# Patient Record
Sex: Female | Born: 1960 | Race: White | Hispanic: No | Marital: Single | Smoking: Current every day smoker
Health system: Southern US, Community
[De-identification: ages and names within clinical notes are randomized; demographics above are authoritative.]

## PROBLEM LIST (undated history)

## (undated) DIAGNOSIS — M1990 Unspecified osteoarthritis, unspecified site: Secondary | ICD-10-CM

## (undated) DIAGNOSIS — F32A Depression, unspecified: Secondary | ICD-10-CM

## (undated) DIAGNOSIS — J189 Pneumonia, unspecified organism: Secondary | ICD-10-CM

## (undated) DIAGNOSIS — F419 Anxiety disorder, unspecified: Secondary | ICD-10-CM

## (undated) DIAGNOSIS — R569 Unspecified convulsions: Secondary | ICD-10-CM

## (undated) DIAGNOSIS — IMO0001 Reserved for inherently not codable concepts without codable children: Secondary | ICD-10-CM

## (undated) DIAGNOSIS — M797 Fibromyalgia: Secondary | ICD-10-CM

## (undated) DIAGNOSIS — G709 Myoneural disorder, unspecified: Secondary | ICD-10-CM

## (undated) DIAGNOSIS — F329 Major depressive disorder, single episode, unspecified: Secondary | ICD-10-CM

## (undated) DIAGNOSIS — I10 Essential (primary) hypertension: Secondary | ICD-10-CM

## (undated) DIAGNOSIS — M8580 Other specified disorders of bone density and structure, unspecified site: Secondary | ICD-10-CM

## (undated) DIAGNOSIS — M549 Dorsalgia, unspecified: Secondary | ICD-10-CM

## (undated) DIAGNOSIS — G4730 Sleep apnea, unspecified: Secondary | ICD-10-CM

## (undated) DIAGNOSIS — S62102A Fracture of unspecified carpal bone, left wrist, initial encounter for closed fracture: Secondary | ICD-10-CM

## (undated) DIAGNOSIS — J449 Chronic obstructive pulmonary disease, unspecified: Secondary | ICD-10-CM

## (undated) DIAGNOSIS — K219 Gastro-esophageal reflux disease without esophagitis: Secondary | ICD-10-CM

## (undated) HISTORY — PX: TONSILLECTOMY: SUR1361

## (undated) HISTORY — PX: DILATION AND CURETTAGE OF UTERUS: SHX78

## (undated) HISTORY — DX: Other specified disorders of bone density and structure, unspecified site: M85.80

## (undated) HISTORY — PX: JOINT REPLACEMENT: SHX530

## (undated) HISTORY — PX: TOTAL SHOULDER REPLACEMENT: SUR1217

## (undated) HISTORY — DX: Chronic obstructive pulmonary disease, unspecified: J44.9

## (undated) HISTORY — PX: KNEE SURGERY: SHX244

## (undated) HISTORY — PX: ANKLE SURGERY: SHX546

## (undated) HISTORY — PX: SHOULDER SURGERY: SHX246

## (undated) HISTORY — PX: FOOT SURGERY: SHX648

---

## 1999-12-01 ENCOUNTER — Other Ambulatory Visit: Admission: RE | Admit: 1999-12-01 | Discharge: 1999-12-01 | Payer: Self-pay | Admitting: Family Medicine

## 2004-08-18 ENCOUNTER — Encounter
Admission: RE | Admit: 2004-08-18 | Discharge: 2004-10-20 | Payer: Self-pay | Admitting: Physical Medicine and Rehabilitation

## 2004-09-16 ENCOUNTER — Ambulatory Visit: Payer: Self-pay | Admitting: Physical Medicine and Rehabilitation

## 2004-09-29 ENCOUNTER — Ambulatory Visit: Payer: Self-pay | Admitting: Family Medicine

## 2004-10-20 ENCOUNTER — Encounter
Admission: RE | Admit: 2004-10-20 | Discharge: 2005-01-18 | Payer: Self-pay | Admitting: Physical Medicine and Rehabilitation

## 2004-10-30 ENCOUNTER — Ambulatory Visit: Payer: Self-pay | Admitting: Family Medicine

## 2004-11-19 ENCOUNTER — Encounter
Admission: RE | Admit: 2004-11-19 | Discharge: 2005-02-17 | Payer: Self-pay | Admitting: Physical Medicine and Rehabilitation

## 2004-11-24 ENCOUNTER — Ambulatory Visit: Payer: Self-pay | Admitting: Family Medicine

## 2004-12-03 ENCOUNTER — Ambulatory Visit: Payer: Self-pay | Admitting: Family Medicine

## 2005-01-12 ENCOUNTER — Encounter: Admission: RE | Admit: 2005-01-12 | Discharge: 2005-01-12 | Payer: Self-pay | Admitting: Orthopedic Surgery

## 2005-01-15 ENCOUNTER — Ambulatory Visit (HOSPITAL_BASED_OUTPATIENT_CLINIC_OR_DEPARTMENT_OTHER): Admission: RE | Admit: 2005-01-15 | Discharge: 2005-01-15 | Payer: Self-pay | Admitting: Orthopedic Surgery

## 2005-01-15 ENCOUNTER — Ambulatory Visit (HOSPITAL_COMMUNITY): Admission: RE | Admit: 2005-01-15 | Discharge: 2005-01-15 | Payer: Self-pay | Admitting: Orthopedic Surgery

## 2005-05-07 ENCOUNTER — Inpatient Hospital Stay (HOSPITAL_COMMUNITY): Admission: RE | Admit: 2005-05-07 | Discharge: 2005-05-08 | Payer: Self-pay | Admitting: Orthopedic Surgery

## 2005-05-07 ENCOUNTER — Ambulatory Visit (HOSPITAL_COMMUNITY): Admission: RE | Admit: 2005-05-07 | Discharge: 2005-05-07 | Payer: Self-pay | Admitting: Orthopedic Surgery

## 2005-06-01 ENCOUNTER — Encounter: Admission: RE | Admit: 2005-06-01 | Discharge: 2005-08-30 | Payer: Self-pay | Admitting: Orthopedic Surgery

## 2005-07-08 ENCOUNTER — Ambulatory Visit: Payer: Self-pay | Admitting: Family Medicine

## 2006-04-04 ENCOUNTER — Encounter: Admission: RE | Admit: 2006-04-04 | Discharge: 2006-05-17 | Payer: Self-pay | Admitting: Orthopedic Surgery

## 2006-06-03 ENCOUNTER — Ambulatory Visit (HOSPITAL_BASED_OUTPATIENT_CLINIC_OR_DEPARTMENT_OTHER): Admission: RE | Admit: 2006-06-03 | Discharge: 2006-06-03 | Payer: Self-pay | Admitting: Orthopedic Surgery

## 2006-11-02 ENCOUNTER — Ambulatory Visit (HOSPITAL_BASED_OUTPATIENT_CLINIC_OR_DEPARTMENT_OTHER): Admission: RE | Admit: 2006-11-02 | Discharge: 2006-11-02 | Payer: Self-pay | Admitting: Orthopedic Surgery

## 2006-12-12 ENCOUNTER — Ambulatory Visit: Payer: Self-pay | Admitting: Family Medicine

## 2007-01-24 ENCOUNTER — Ambulatory Visit: Payer: Self-pay | Admitting: Family Medicine

## 2007-02-10 ENCOUNTER — Ambulatory Visit: Payer: Self-pay | Admitting: Family Medicine

## 2007-02-24 ENCOUNTER — Ambulatory Visit: Payer: Self-pay | Admitting: Internal Medicine

## 2007-03-22 ENCOUNTER — Ambulatory Visit (HOSPITAL_COMMUNITY): Admission: RE | Admit: 2007-03-22 | Discharge: 2007-03-22 | Payer: Self-pay | Admitting: Orthopedic Surgery

## 2007-05-05 ENCOUNTER — Encounter: Admission: RE | Admit: 2007-05-05 | Discharge: 2007-06-06 | Payer: Self-pay | Admitting: Orthopedic Surgery

## 2007-05-09 ENCOUNTER — Ambulatory Visit: Payer: Self-pay | Admitting: Internal Medicine

## 2008-04-16 ENCOUNTER — Encounter: Payer: Self-pay | Admitting: Internal Medicine

## 2009-12-04 ENCOUNTER — Telehealth (INDEPENDENT_AMBULATORY_CARE_PROVIDER_SITE_OTHER): Payer: Self-pay | Admitting: *Deleted

## 2010-10-18 HISTORY — PX: BREAST SURGERY: SHX581

## 2010-11-17 NOTE — Progress Notes (Signed)
Summary: Records request from [facility]  Request for records received from [facility]. Request forwarded to [facility]. Wilder Glade  December 04, 2009 12:28 PM

## 2011-03-02 NOTE — Assessment & Plan Note (Signed)
HEALTHCARE                             PULMONARY OFFICE NOTE   PRESTYN, MAHN                          MRN:          [id number]  DATE:05/09/2007                            DOB:          03/05/1961    HISTORY:  A 50 year old white female, active smoker, seen on Feb 24, 2007  with evidence of at least moderate COPD with question of asthmatic  component started on Symbicort 160/4.5 two puffs b.i.d. with the  recommendation that she commit to quit smoking.  She has not been able  to really cut down, is still on 1 pack per day but states she is overall  doing much better.  For some reason she did not understand my  instructions to only take DuoNeb on a p.r.n. basis and continue to use  it at least 3 times daily, including immediately on getting up in the  morning before she even tries to take her Symbicort.  She denies any  excess sputum production, fevers, chills, sweats, pleuritic pain,  orthopnea, PND, or leg swelling.  She states that she is so much better  on Symbicort that after she uses her inhalers she is no longer limited  in terms of activities of daily living, albeit relatively sedentary.   MEDICATIONS:  Reviewed in detail on the worksheet and contain multiple  psychotropics, see column dated May 09, 2007 for details.   PHYSICAL EXAMINATION:  She is a thin, ambulatory white female in no  acute distress.  She is afebrile, normal vital signs.  HEENT:  Unremarkable, oropharynx clear.  LUNGS:  Fields reveal diminished breath sounds bilaterally, no wheezing.  Hoover sign is positive early on inspiration.  Regular rhythm without murmur, gallop, rub.  EXTREMITIES:  Warm without calf tenderness, cyanosis, clubbing, edema.   PFTs performed today after she had used her morning Symbicort and  indicates an FEV1 of 64% predicted with a ratio of 49%, no further  improvement after bronchodilators.  Diffusion capacity 58% and a FRC of  160%.   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  At 64% predicted she has enough of a  ventilatory reserve to be able to enjoy activities of daily living for  now.  At this point I think it makes sense to use Symbicort, however as  a maintenance, and not to use DuoNeb unless she is having a bad day.   Rather than add additional medications at this point I emphasized the  importance of her choice to stop smoking.  If she is able to quit  smoking, perhaps it would be possible to taper the Symbicort because the  asthmatic component to the problem likely would resolve off of  cigarettes.  If she worsens I would not hesitate to add Spiriva but I  told her that unless she still feels limited after she has used the  bronchodilators there is no point to add it and that stopping smoking  would make more sense than adding additional inhalers at this  point.   I spent an extra 15-20 minutes of a 25 minute visit going over the  natural history of COPD in the active smoker and referred her back to  Dr. Lysbeth Galas for longterm followup.  We will be happy to see her back here  in [place] on a p.r.n. basis.     Charlaine Dalton. Sherene Sires, MD, [facility]  Electronically Signed    MBW/MedQ  DD: 05/09/2007  DT: 05/10/2007  Job #: [id number]   cc:   Delaney Meigs, M.D.

## 2011-03-02 NOTE — Assessment & Plan Note (Signed)
Brenda Duran                             PULMONARY OFFICE Duran   Brenda Duran, Brenda Duran                          MRN:          [id number]  DATE:02/24/2007                            DOB:          24-Jan-1961    CHIEF COMPLAINT:  Dyspnea and cough.   HISTORY:  This is a 50 year old white female, who states she has been  having trouble with dyspnea and cough since she was 17, which  corresponds roughly to the time she started smoking.  She continues to  smoke a pack a day with paroxysms of dyspnea and cough that wax and  wane, typically worse in the spring, but definitely worse for the last  month and a half (which roughly correlates to the spring season).  She  coughs up minimal mucus, but the most productive cough is first thing in  the morning and is somewhat dark.  The rest of the mucus during the day  is somewhat clear.  She is short of breath on her bad days, just  walking from one room to the next.  On her good days, she is relatively  sedentary, but is able to carry on normal activities.  She says she is  some better after using a nebulizer, but it does not last as long as  it used to, and she is having to use it more frequently.  She also  occasionally wakes up at night with coughing and dyspnea.  She denies  any pleuritic pain, classic orthopnea, PND, or leg-swelling.   PAST MEDICAL HISTORY:  Significant for hypertension and asthma and  multiple orthopedic surgeries.   ALLERGIES:  None known.   MEDICATIONS INCLUDE:  Lorazepam, Norvasc, Neurontin and Lamictal, as  well as a nebulizer that I believe is DuoNeb (although she was not  sure).  She says she is using various inhalers in the past that do not  work.   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   FAMILY HISTORY:  Significant in that her mother had emphysema and was a  smoker.  Father had lung cancer and was a smoker.   REVIEW OF SYSTEMS:  Taken in detail on the work sheet.  Negative, except  as outlined above.   PHYSICAL EXAMINATION:  This is an anxious white female, who had a very  difficult time answering any questions I asked her in a straight-forward  fashion.  She is afebrile, normal vital signs.  HEENT:  She was edentulous with a partial plate in place.  Oropharynx  was otherwise unremarkable.  Nasal turbinates were normal.  Ear canals  were clear.  NECK:  The neck was supple, no cervical adenopathy or tenderness.  Trachea is midline, no thyromegaly.  CHEST:  Minimally barrel-shaped and hyper-resonant to percussion with  diminished breath sounds, with minimal true wheeze.  This exam was four  and a half hours after her last DuoNeb.  There was regular rate and  rhythm without murmur, gallop or rub.  ABDOMEN:  Soft,  benign with Hoover sign at the end of inspiration.  EXTREMITIES:  Warm, without calf tenderness, cyanosis, clubbing or  edema.   Hemoglobin saturation 94% on room air.   She brought a disc with her, but it would not load on the computer and  was done at [facility].  I have requested her x-ray report.  She  says this was from a chest x-ray done yesterday at [facility].   IMPRESSION:  COPD, secondary to active smoking, with a probable  asthmatic component that would be better addressed by smoking cessation  than by piling on medications or consultants.  I am sure she has heard  the same message from Dr. Lysbeth Galas, but she heard it loud and clear from  me in as many different ways as I could express it to her.   For now, I recommended the following approach:   1. We will check a chest x-ray report when available from [facility].  2. Add Symbicort 160/4.5 two puffs b.i.d.  (Although she has used      inhalers, she did not recognize this one.  I think it is worth      trying to see if there is an asthmatic component that can be      reversed.)  3. For dyspnea, use DuoNeb every six  hours p.r.n., not around-the-      clock.  4. For cough, use Mucinex-DM one to two q.12.   I sense this patient either has extremely poor insight into her problem  or significant psychologic issues that are preventing her from really  processing the information she is given.  I made a copy of my  recommendations, including a reasonable acid reflux diet (since so many  patients with difficult-to-control airway symptoms have reflux).  The  first statement on the reflux diet is, Stop tobacco in all forms,  which I highlighted for her benefit.   Followup will be in six weeks with a set of PFTs.     Brenda Dalton. Sherene Sires, MD, [facility]  Electronically Signed    MBW/MedQ  DD: 02/24/2007  DT: 02/24/2007  Job #: [id number]   cc:   Delaney Meigs, M.D.
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
## 2011-03-05 NOTE — Op Note (Signed)
NAME:  Brenda Duran, Brenda Duran                 ACCOUNT NO.:  [id number]   MEDICAL RECORD NO.:  [id number]          PATIENT TYPE:  AMB   LOCATION:  [facility]                          FACILITY:  [facility]   PHYSICIAN:  Harvie Junior, M.D.   DATE OF BIRTH:  1961/04/28   DATE OF PROCEDURE:  01/15/2005  DATE OF DISCHARGE:                                 OPERATIVE REPORT   PREOPERATIVE DIAGNOSIS:  Lateral meniscal tear.   POSTOPERATIVE DIAGNOSES:  1.  Lateral meniscal tear.  2.  Chondromalacia of the patella.  3.  Medial shelf plica.   PRINCIPAL PROCEDURES:  1.  Partial posterior horn lateral meniscectomy, as well as including the      midbody and anterior.  2.  Debridement of chondromalacia of the patella, in particular the      posterior surface of the patella.  3.  Debridement of medial shelf plica.   SURGEON:  Harvie Junior, M.D.   ASSISTANT:  Marshia Ly, P.A.   ANESTHESIA:  General.   BRIEF HISTORY:  A 50 year old female with a long history of having right  knee pain.  She ultimately underwent an MRI which showed she had a complex  lateral meniscal tear as well as some questionable tibial plateau  chondromalacia.  We treated her conservatively for a period of time, and  because of failure of conservative care she ultimately was taken to the  operating room for operative knee arthroscopy.   PROCEDURE:  The patient was brought to the operating room.  After adequate  anesthesia was obtained  with a general anesthetic, the patient was placed  on the operating table.  The right leg was then prepped and draped in the  usual sterile fashion.  Following this, routine arthroscopic examination of  the knee revealed that there was an obvious posterior horn as well as  midbody as well as anterior lateral meniscal tear, and there was a portion  of that meniscus folded up into the notch.  This was debrided with a  combination of straight biting forceps and backbiting forceps, and following  this, the  remaining menisci __________ shaver probe was used to make sure it  was stable.  Attention was then turned to the notch, where the ACL was  normal.  Attention was turned medially, where the medial meniscus was  normal.  Medial femoral condyle and tibial plateau normal.  A large medial  shelf plica blocking entrance into the medial compartment was debrided to  stop rubbing on the medial femoral condyle.  At this point, attention was  turned to the posterior aspect of the patella, which had a large chondral  defect which was  debrided, and at this point once this was completed, the wound was copiously  irrigated and suctioned dry.  A sterile compressive dressing was applied,  and the patient was taken to the recovery room and noted to be in  satisfactory condition.  Estimated blood loss for the procedure was minimal.      JLG/MEDQ  D:  01/15/2005  T:  01/15/2005  Job:  [id number]   cc:  Harvie Junior, M.D.  [address]  [place]  [place] [address]  Fax: [phone]

## 2011-03-05 NOTE — Op Note (Signed)
NAME:  LENISE, JR                 ACCOUNT NO.:  [id number]   MEDICAL RECORD NO.:  [id number]          PATIENT TYPE:  AMB   LOCATION:  [facility]                          FACILITY:  [facility]   PHYSICIAN:  Harvie Junior, M.D.   DATE OF BIRTH:  May 16, 1961   DATE OF PROCEDURE:  06/03/2006  DATE OF DISCHARGE:                                 OPERATIVE REPORT   SURGEON:  Harvie Junior, M.D.   ASSISTANT:  Marshia Ly, P.A.   PREOPERATIVE DIAGNOSIS:  Osteochondral defect, medial talus, left.   POSTOPERATIVE DIAGNOSES:  1. Osteochondral defect, medial talus, left.  2. Lateral gutter impingement.   PRINCIPLE PROCEDURES:  1. Debridement and drilling of osteochondral defect, left ankle.  2. Debridement of lateral gutter impingement.   ANESTHESIA:  General.   BRIEF HISTORY:  The patient is a 50 year old female with a long history of  having had significant bilateral ankle pain.  X-rays show that she had an  osteochondral defect.  We talked about treatment options and ultimately felt  after failure of care that arthroscopic debridement would be the most  appropriate course of action.  She is brought to the operating room for this  procedure.   DESCRIPTION OF PROCEDURE:  The patient was brought to the operating room,  and after adequate anesthesia was obtained with a general anesthetic, the  patient was placed on the operating table and the left ankle was then  prepped and draped in the usual sterile fashion.  Following this, routine  arthroscopic examination of the ankle revealed there was an obvious lateral-  gutter impingement.  This was debrided with a suction shaver back to a  smooth and stable rim.  The lateral gutter was checked and noted to be  stable.  Attention was then turned over to the medial compartment, where the  buckle in the cartilage was identified.  A probe was used, and there was  seen to be no area that was satisfactory for compression underneath the  cartilage.  So,  anterior-to-posterior, we felt that the most appropriate  course of action was going to be debridement of this area. There was no bone  on the back side.  I did not feel that we could get any kind of ingrowth to  support this cartilage.  At this point, the area was debrided with the  suction shaver, and once this was accomplished, the area was drilled with  vascular awl enhancement drilling.  Once that was accomplished, the ankle  was copiously irrigated and suctioned dry.  The arthroscopic portals  were closed with an interrupted nylon stitching.  A sterile compressive  dressing was applied, as well as a Cam walker, and the patient was taken to  recovery, where she was noted to be in satisfactory condition.  Estimated  blood loss for the procedure was negligible.      Harvie Junior, M.D.  Electronically Signed     JLG/MEDQ  D:  06/03/2006  T:  06/03/2006  Job:  [id number]

## 2011-03-05 NOTE — Assessment & Plan Note (Signed)
Brenda Duran is a 50 year old white female who has returned to pain and  rehabilitative clinic for a recheck.  She was last seen on September 16, 2004.  She had another appointment August 20, 2004.  She was too late to be  seen that day.  She cancelled another clinic appointment on October 22, 2004.   She has complaints of pain in bilateral feet, and her right shoulder, as  well as her right knee.  She has recently undergone some left foot surgery  and has some well healed scars in the anterior surface of the left foot.  Apparently, there are some plans for surgery on the right foot as well.  Her  pain is about a 6 on a scale of 10.  She describes it as stinging.  It  interferes with her activity and enjoyment of life quite a bit.  Her pain is  worse for her in the morning and at night.  Sleep is overall poor.  The pain  is worse with walking, bending, sitting, standing, and certain activities.  It improves with rest and medication.   Relief from her medications is overall fair.  She has been using Lidoderm.  She has been on Lexapro and hydrocodone.  She has been getting her hydrocodone from Dr. Lysbeth Galas.   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      DMK/MedQ  D:  11/11/2004 17:35:59  T:  11/11/2004 22:39:34  Job #:  [id number]

## 2011-03-05 NOTE — Group Therapy Note (Signed)
ALLERGIES:  HYDROCODONE CAUSES ITCHING.   HISTORY:  Brenda Duran is a single white female who presents to our clinic for  management of right knee pain. She is a patient of Fidel Levy.   She recently has undergone left foot surgery by Dr. Adam Phenix,  approximately 1 week ago.  She had 3 hammer toes which were surgically  repaired by him.   She reports that her knee has had some swelling in it off and on since the  spring. She had an incident where she was dancing and squatted and developed  pain. She has had fluid removed from the knee and has had injections to the  knee. She reports the injections helped for a couple of days but no long  lasting relief.   She denies any other trauma to the knee.   She describes her knee pain as about a 7 on a scale of 10. Has had some  intermittent swelling, worse when she is up walking on it, especially since  her surgery a week ago. She has had to put a lot more stress on that right  leg.   She denies any numbness, tingling or weakness into the lower extremities.  Does report some numbness and tingling into bilateral upper extremities,  especially when she is doing hand work.   The patient has some limitations in her functional status, especially with  walking at this point.  She uses a quad cane. She has a cast boot that she  has to put on her left foot. She does help out somewhat with the cleaning  around the house, some lighter house tasks such as meal preparation, washing  the dishes. Spends a good amount of time watching TV and playing Nintendo.   She has a history of a DWI and no longer drives.   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   She denies harm to self or others.  Overall affect and mood are fairly good.   She has a history of alcohol use, drinks about a 6-pack on a weekend. Admits  to marijuana use. Smokes a pack of cigarettes a day.  Lives with her  boyfriend, has a 32-year-old son who has some developmental problems  secondary to hydrocephalus.   FAMILY HISTORY:  Remarkable for a sister and mother, possibly a paternal  grandmother who all have problems with high arches and hammer toes.   ALLERGIES:  CODEINE (causes itching).   REVIEW OF SYSTEMS:  Negative for diabetes, ulcers, cancers, kidney problems,  thyroid problems, heart problems or high blood pressure.  Does admit to some  problems with shortness of breath, swelling, wheezing and coughing, poor  appetite, reflux, poor sleep, weakness, numbness, spasms, seizures.   PAST SURGICAL HISTORY:  Remarkable for previous bilateral foot surgery in  addition to this recent left foot surgery.  History of tonsillectomy and  some gynecologic surgery as well.   CURRENT MEDICATIONS:  1.  Hydrocodone 5/500 1 p.o. q.4-6h.  2.  Diazepam 5 mg 1 p.o. t.i.d.  3.  Promethazine 25 mg p.r.n.   PHYSICAL EXAMINATION:  GENERAL:  A mildly obese white female, does not  appear in distress. She is alert and oriented, cooperative.  VITAL SIGNS:  Blood pressure is 131/111, pulse 99, respirations 18, 95%  saturation on room air.  MUSCULOSKELETAL EXAM:  She is able to get out of the chair, her gait in the  room  is obviously antalgic with decreased weight bearing on the involved  left foot.  Seated, she is noted to have mild effusion in the right knee.  No joint line tenderness although she does have limitations and full  extension, she lacks about 10 to 15 degrees, she is able to flex to about  110 degrees.  She has some lateral medial laxity but firm end points with  the collateral ligaments.   No swelling in the right lower extremity. Sensation is intact. Pulses are  intact. She is has rather underdeveloped calves bilaterally.  Left lower  extremity is remarkable for some swelling noted about the ankle. The rest of  her foot is in a bandage, her great toe is exposed, the rest of her toes are   bandaged.   Again, pulses at the posterior tibia are noted and the left lower extremity  intact. Motor strength in the lower extremities as well as upper extremities  are in the 5/5 range. Hip flexors, knee extensors, and dorsiflexors, plantar  flexors, EHL was not tested on the left.  Sensation was intact with light touch and cold in the lower extremities.   Reflexes are 2+ in the upper extremities, and 2+ in the lower extremities at  the knees and ankles. No clonus is noted.   IMPRESSION:  1.  Right knee effusion with knee pain.  2.  Left foot status post hammer toe repair one week postoperatively.  3.  History of some intermittent hand tingling.  4.  History of marijuana use.   PLAN:  Will give the patient some samples of Lidoderm to put over her right  knee and give her a prescription.  May have her set up to see an  orthopedist. She may need an MRI or further evaluation to determine etiology  of the constant/intermittent effusion she is having as well as her lack of  full range of motion. I am not sure if she has a meniscal injury or not  here.  It sounds as though she has had fluid removed several times with  recurrence of the effusion.  On next visit will assess her further after she  is a little further down the road from her operation. If she does have  bilateral foot pain, may consider adding Neurontin. Would also consider  possibly some EMG nerve conduction studies, it appears she may have a family  history of a hereditary neuropathy here.  May consider wrist splints for her  intermittent numbness and tingling she has in her hands. Will consider  referral to a neurologist as well.  Will see her back in 1 month and also  obtain the urine drug screen.      DMK/MedQ  D:  09/16/2004 16:36:02  T:  09/16/2004 19:05:29  Job #:  [id number]

## 2011-03-05 NOTE — Op Note (Signed)
NAME:  Brenda Duran, Brenda Duran                 ACCOUNT NO.:  [id number]   MEDICAL RECORD NO.:  [id number]          PATIENT TYPE:  INP   LOCATION:  5009                         FACILITY:  [facility]   PHYSICIAN:  Harvie Junior, M.D.   DATE OF BIRTH:  01/15/1961   DATE OF PROCEDURE:  05/07/2005  DATE OF DISCHARGE:                                 OPERATIVE REPORT   PREOPERATIVE DIAGNOSIS:  Impingement acromioclavicular joint arthritis with  known rotator cuff tear and questionable biceps tendon issues.   POSTOPERATIVE DIAGNOSIS:  1.  Rotator cuff tear, full thickness.  2.  Biceps tendon tear, full thickness.  3.  Impingement.  4.  Acromioclavicular joint arthritis.  5.  Posterior superior labral tear.   PROCEDURE:  1.  Mini-open rotator cuff repair with acromioplasty.  2.  Distal clavicle resection arthroscopic.  3.  Biceps tenodesis, open.  4.  Debridement of posterior superior labral tear.   SURGEON:  Harvie Junior, M.D.   ASSISTANT:  Marshia Ly, P.A.-C.   ANESTHESIA:  General.   BRIEF HISTORY:  The patient is a 50 year old female with a long history of  having had significant right shoulder pain.  MRI was obtained which showed  rotator cuff tear, biceps tendon issues, and questionable labral tear.  We  talked about treatment options and ultimately felt that given her young age,  that fixation was the most appropriate course of action.  She is brought to  the operating room for this procedure.   DESCRIPTION OF PROCEDURE:  The patient was taken to the operating room and  after adequate anesthesia was obtained with general anesthesia, the patient  was placed supine on the operating table.  She was then moved to the beach  chair position, all bony prominences were well padded.  Attention was turned  to the right shoulder where routine arthroscopic visualization revealed that  there was an obvious and significant posterior superior labral tear, the  biceps tendon was obviously  dislocated, and not within its groove.  It was  thought at that point that the biceps tendon may be salvageable.  As we  probed the biceps tendon further, it appeared that there was shredding of  the biceps tendon and there was significant fray of the biceps tendon.  At  that point, it was felt that the most appropriate course of action was going  to be bicipital tenodesis and the biceps tendon was cut away from the  superior labrum and the superior labrum associated with the posterior  extension tear was debrided.  The glenohumeral joint showed no significant  evidence of arthritis.  The rotator cuff was evaluated from the under  surface and noted to have significant tear, as well.  At this point, the  attention was turned away from the glenohumeral joint into the subacromial  space.  An anterolateral acromioplasty was performed from the lateral  compartment, a distal clavicle resection over 15 mm was performed through an  anterior compartment.  Once these two procedures had been performed,  attention was turned towards the rotator cuff.  The tear was identified  from  the superior surface.  At this point, the arthroscopic portion of the case  was abandoned and attention was turned to a mini-open procedure.  A small  incision was made over the lateral aspect of the shoulder.  The subcutaneous  tissues were dissected down to the level of the raphe between the anterior  and middle deltoid.  This was divided and the rotator cuff tear was  identified below.  Time was spent identifying the biceps tendon and the  rotator cuff tear biceps tendon tenodesis was performed by putting a whip  stitch through the length of the biceps tendon and, following this, with  tenodesing the biceps tendon with a biceps tenodesis screw.  A 7 mm hole was  drilled in the biceps tendon area and the tendon was tenodesed with the  tenodesis screw.  The rotator cuff tear was then identified and two 5 mm  suture anchors  each with two sutures were used and the rotator cuff was  repaired back to bone.  A side-to-side repair was done and a rent between  the supraspinatus and infraspinatus.  At this point, the shoulder was put  through a range of motion, no tendency towards impingement.  The wound was  copiously irrigated and suctioned dry.  The raphe between the middle and  anterior deltoid was closed with #1 Vicryl running suture, the subcutaneous  tissue with 0 and 2-0 Vicryl, and the skin with 3-0 Prolene subcuticular  suture.  A sterile compressive dressing was applied and the patient was  taken to the recovery room where she was noted to be in satisfactory  condition.  Estimated blood loss for the procedure was none.       JLG/MEDQ  D:  05/07/2005  T:  05/07/2005  Job:  [id number]

## 2011-03-05 NOTE — Op Note (Signed)
NAME:  Brenda Duran, Brenda Duran                 ACCOUNT NO.:  [id number]   MEDICAL RECORD NO.:  [id number]          PATIENT TYPE:  AMB   LOCATION:  DSC                          FACILITY:  [facility]   PHYSICIAN:  Harvie Junior, M.D.   DATE OF BIRTH:  Jul 28, 1961   DATE OF PROCEDURE:  11/02/2006  DATE OF DISCHARGE:                               OPERATIVE REPORT   POSTOPERATIVE DIAGNOSIS:  Osteochondritis desiccans, medial ankle.   POSTOPERATIVE DIAGNOSIS:  Osteochondritis desiccans, medial ankle.   PRINCIPLE PROCEDURE:  Ankle arthroscopy with debridement of full-  thickness osteochondral defect on the medial side, with vascular  enhancement drilling.   SURGEON:  Harvie Junior, M.D.   ASSISTANT:  Marshia Ly, P.A.   ANESTHESIA:  General.   BRIEF HISTORY:  Brenda Duran is a 50 year old female with a long history of  having had significant pain in her bilateral ankles.  Ultimately, she  had x-ray showing osteochondral defects bilaterally.  We had debrided  and drilled the left ankle, and she had done wonderfully with this, and  because of continued complaints of pain in the right ankle, she was  ultimately taken to the operating room for this procedure.   PROCEDURE:  The patient was taken to the operating room, and after  adequate anesthesia was obtained with a general anesthetic, the patient  was placed supine on the operating room table.  The right leg was  prepped and draped in the usual sterile fashion.  Following this,  routine arthroscopic examination of the right ankle revealed that there  was an obvious OCD on the medial side.  A probe was used and you could  see the large flaps of cartilage.  We debrided the flaps of cartilage  out, cleaned up the flaps of cartilage, and then we used the vascular  enhancement awls to make 6 holes in this area.  We could see blood  escaping from the portals once this was done.  Attention was then turned  to the lateral gutter, where there was some lateral  impingement, which  was debrided.  The central portion had no significant abnormality, and  we were able to see all the way over the top to the back of the tibia.  At this point, the ankle was copiously irrigated with normal sterile  saline irrigation and suctioned dry, and the arthroscopic portals were  closed with a bandage after a single stitch was placed at each partial.  A sterile compressive dressing was applied and a posterior splint.  The  patient was taken to recovery, where she was noted to be in satisfactory  condition.  Estimated blood loss for the procedure was negligible.      Harvie Junior, M.D.  Electronically Signed     JLG/MEDQ  D:  11/02/2006  T:  11/02/2006  Job:  [id number]

## 2011-10-19 DIAGNOSIS — G4730 Sleep apnea, unspecified: Secondary | ICD-10-CM

## 2011-10-19 HISTORY — DX: Sleep apnea, unspecified: G47.30

## 2012-02-09 ENCOUNTER — Emergency Department (HOSPITAL_COMMUNITY)
Admission: EM | Admit: 2012-02-09 | Discharge: 2012-02-09 | Disposition: A | Discharge status: Home / Self Care | Payer: Medicaid Other | Attending: Emergency Medicine | Admitting: Emergency Medicine

## 2012-02-09 ENCOUNTER — Encounter (HOSPITAL_COMMUNITY): Payer: Self-pay | Admitting: Emergency Medicine

## 2012-02-09 ENCOUNTER — Emergency Department (HOSPITAL_COMMUNITY): Payer: Medicaid Other

## 2012-02-09 DIAGNOSIS — F329 Major depressive disorder, single episode, unspecified: Secondary | ICD-10-CM | POA: Insufficient documentation

## 2012-02-09 DIAGNOSIS — M545 Low back pain, unspecified: Secondary | ICD-10-CM | POA: Insufficient documentation

## 2012-02-09 DIAGNOSIS — F17200 Nicotine dependence, unspecified, uncomplicated: Secondary | ICD-10-CM | POA: Insufficient documentation

## 2012-02-09 DIAGNOSIS — F3289 Other specified depressive episodes: Secondary | ICD-10-CM | POA: Insufficient documentation

## 2012-02-09 DIAGNOSIS — M549 Dorsalgia, unspecified: Secondary | ICD-10-CM

## 2012-02-09 DIAGNOSIS — I10 Essential (primary) hypertension: Secondary | ICD-10-CM | POA: Insufficient documentation

## 2012-02-09 HISTORY — DX: Essential (primary) hypertension: I10

## 2012-02-09 HISTORY — DX: Dorsalgia, unspecified: M54.9

## 2012-02-09 HISTORY — DX: Major depressive disorder, single episode, unspecified: F32.9

## 2012-02-09 HISTORY — DX: Depression, unspecified: F32.A

## 2012-02-09 MED ORDER — KETOROLAC TROMETHAMINE 60 MG/2ML IM SOLN
60.0000 mg | Freq: Once | INTRAMUSCULAR | Status: AC
  Administered 2012-02-09: 60 mg via INTRAMUSCULAR
  Filled 2012-02-09: qty 2

## 2012-02-09 MED ORDER — OXYCODONE-ACETAMINOPHEN 5-325 MG PO TABS: 1.0000 | ORAL_TABLET | ORAL | Status: AC | PRN

## 2012-02-09 MED ORDER — HYDROMORPHONE HCL PF 1 MG/ML IJ SOLN
1.0000 mg | Freq: Once | INTRAMUSCULAR | Status: AC
  Administered 2012-02-09: 1 mg via INTRAMUSCULAR
  Filled 2012-02-09: qty 1

## 2012-02-09 MED ORDER — CYCLOBENZAPRINE HCL 10 MG PO TABS: 10.0000 mg | ORAL_TABLET | Freq: Two times a day (BID) | ORAL | Status: AC | PRN

## 2012-02-09 NOTE — ED Notes (Signed)
Pt c/o back pain after her back popped today. Pt denies any injury.

## 2012-02-09 NOTE — Discharge Instructions (Signed)
Your xray did not show any changes to your back. Make position changes slowly. Apply heat for comfort. Use the pain medicine and muscle relaxant as directed. Follow up with your doctor.   Back Exercises Back exercises help treat and prevent back injuries. The goal is to increase your strength in your belly (abdominal) and back muscles. These exercises can also help with flexibility. Start these exercises when told by your doctor. HOME CARE Back exercises include: Pelvic Tilt.  Lie on your back with your knees bent. Tilt your pelvis until the lower part of your back is against the floor. Hold this position 5 to 10 sec. Repeat this exercise 5 to 10 times.  Knee to Chest.  Pull 1 knee up against your chest and hold for 20 to 30 seconds. Repeat this with the other knee. This may be done with the other leg straight or bent, whichever feels better. Then, pull both knees up against your chest.  Sit-Ups or Curl-Ups.  Bend your knees 90 degrees. Start with tilting your pelvis, and do a partial, slow sit-up. Only lift your upper half 30 to 45 degrees off the floor. Take at least 2 to 3 seonds for each sit-up. Do not do sit-ups with your knees out straight. If partial sit-ups are difficult, simply do the above but with only tightening your belly (abdominal) muscles and holding it as told.  Hip-Lift.  Lie on your back with your knees flexed 90 degrees. Push down with your feet and shoulders as you raise your hips 2 inches off the floor. Hold for 10 seconds, repeat 5 to 10 times.  Back Arches.  Lie on your stomach. Prop yourself up on bent elbows. Slowly press on your hands, causing an arch in your low back. Repeat 3 to 5 times.  Shoulder-Lifts.  Lie face down with arms beside your body. Keep hips and belly pressed to floor as you slowly lift your head and shoulders off the floor.  Do not overdo your exercises. Be careful in the beginning. Exercises may cause you some mild back discomfort. If the pain  lasts for more than 15 minutes, stop the exercises until you see your doctor. Improvement with exercise for back problems is slow.  Document Released: 11/06/2010 Document Revised: 09/23/2011 Document Reviewed: 11/06/2010 [facility] Patient Information 2012 [place], [place].

## 2012-02-09 NOTE — ED Provider Notes (Signed)
History     CSN: [id number]  Arrival date & time 02/09/12  1309   First MD Initiated Contact with Patient 02/09/12 1332      Chief Complaint  Patient presents with  . Back Pain    (Consider location/radiation/quality/duration/timing/severity/associated sxs/prior treatment) HPI Brenda Duran is a 51 y.o. female brought in by ambulance, who presents to the Emergency Department complaining of low back pain. She reports she was standing in her home when all of a sudden she felt her low back pop and she was unable to walk without assistance. She had not done anything strenuous and had not made any unusual movements. Pain is located across his entire lower back. She has taken no medicines. Past Medical History  Diagnosis Date  . Back pain   . Depression   . Hypertension     Past Surgical History  Procedure Date  . Foot surgery   . Knee surgery   . Shoulder surgery   . Back surgery   . Ankle surgery     History reviewed. No pertinent family history.  History  Substance Use Topics  . Smoking status: Current Everyday Smoker    Types: Cigarettes  . Smokeless tobacco: Not on file  . Alcohol Use: No    OB History    Grav Para Term Preterm Abortions TAB SAB Ect Mult Living                  Review of Systems  Constitutional: Negative for fever.       10 Systems reviewed and are negative for acute change except as noted in the HPI. Tearful  HENT: Negative for congestion.   Eyes: Negative for discharge and redness.  Respiratory: Negative for cough and shortness of breath.   Cardiovascular: Negative for chest pain.  Gastrointestinal: Negative for vomiting and abdominal pain.  Musculoskeletal: Negative for back pain.       Low back pain  Skin: Negative for rash.  Neurological: Negative for syncope, numbness and headaches.  Psychiatric/Behavioral:       No behavior change.    Allergies  Review of patient's allergies indicates no known allergies.  Home Medications  No  current outpatient prescriptions on file.  BP 149/97  Pulse 85  Temp(Src) 98.1 F (36.7 C) (Oral)  Resp 20  Ht 4\' 11"  (1.499 m)  Wt 141 lb (63.957 kg)  BMI 28.48 kg/m2  SpO2 94%  Physical Exam  Nursing note and vitals reviewed. Constitutional:       Awake, alert, nontoxic appearance with baseline speech.  HENT:  Head: Atraumatic.  Eyes: Pupils are equal, round, and reactive to light. Right eye exhibits no discharge. Left eye exhibits no discharge.  Neck: Neck supple.  Cardiovascular: Normal rate and regular rhythm.   No murmur heard. Pulmonary/Chest: Effort normal and breath sounds normal. No respiratory distress. She has no wheezes. She has no rales. She exhibits no tenderness.  Abdominal: Soft. Bowel sounds are normal. She exhibits no mass. There is no tenderness. There is no rebound.  Musculoskeletal:       Thoracic back: She exhibits no tenderness.       Lumbar back: She exhibits no tenderness.       Bilateral lower extremities non tender without new rashes or color change, baseline ROM with intact DP / PT pulses, CR<2 secs all digits bilaterally, sensation baseline light touch bilaterally for pt, DTR's symmetric and intact bilaterally KJ / AJ, motor symmetric bilateral 5 / 5 hip flexion, quadriceps,  hamstrings, EHL, foot dorsiflexion, foot plantarflexion, gait not tested due to lower back pain.No pain with percussion tot he cervical, thoracic or lumbar spine. Pain with palpation to the paraspinal muscles bilaterally lumbar spine.  Neurological:       Mental status baseline for patient.  Upper extremity motor strength and sensation intact and symmetric bilaterally.  Skin: No rash noted.  Psychiatric: She has a normal mood and affect.    ED Course  Procedures (including critical care time)  Dg Lumbar Spine Complete  02/09/2012  *RADIOLOGY REPORT*  Clinical Data: Low back pain.  LUMBAR SPINE - COMPLETE 4+ VIEW  Comparison: MRI from 03/17/2011  Findings: No evidence for  fracture.  Loss of disc height is seen at L4-5 and L5-S1.  Facets are unremarkable.  SI joints are unremarkable.  IMPRESSION: Lower lumbar disc disease.  Original Report Authenticated By: ERIC A. MANSELL, M.D.     MDM  Patient with low back pain and no injury. Given IM analgesic and anti-inflammatory with some relief. She was able to sit on the side of the bed, and ambulate with assistance. LS spine xrays with out acute findings.Pt stable in ED with no significant deterioration in condition.The patient appears reasonably screened and/or stabilized for discharge and I doubt any other medical condition or other [facility] requiring further screening, evaluation, or treatment in the ED at this time prior to discharge.  MDM Reviewed: nursing note and vitals Interpretation: x-ray           Nicoletta Dress. Colon Branch, MD 02/09/12 [id number]

## 2012-09-08 ENCOUNTER — Emergency Department (HOSPITAL_COMMUNITY)
Admission: EM | Admit: 2012-09-08 | Discharge: 2012-09-08 | Disposition: A | Discharge status: Home / Self Care | Payer: Medicaid Other | Attending: Emergency Medicine | Admitting: Emergency Medicine

## 2012-09-08 ENCOUNTER — Emergency Department (HOSPITAL_COMMUNITY): Payer: Medicaid Other

## 2012-09-08 ENCOUNTER — Encounter (HOSPITAL_COMMUNITY): Payer: Self-pay | Admitting: *Deleted

## 2012-09-08 DIAGNOSIS — Y939 Activity, unspecified: Secondary | ICD-10-CM | POA: Insufficient documentation

## 2012-09-08 DIAGNOSIS — R079 Chest pain, unspecified: Secondary | ICD-10-CM | POA: Insufficient documentation

## 2012-09-08 DIAGNOSIS — F3289 Other specified depressive episodes: Secondary | ICD-10-CM | POA: Insufficient documentation

## 2012-09-08 DIAGNOSIS — J40 Bronchitis, not specified as acute or chronic: Secondary | ICD-10-CM | POA: Insufficient documentation

## 2012-09-08 DIAGNOSIS — S46919A Strain of unspecified muscle, fascia and tendon at shoulder and upper arm level, unspecified arm, initial encounter: Secondary | ICD-10-CM

## 2012-09-08 DIAGNOSIS — F329 Major depressive disorder, single episode, unspecified: Secondary | ICD-10-CM | POA: Insufficient documentation

## 2012-09-08 DIAGNOSIS — I10 Essential (primary) hypertension: Secondary | ICD-10-CM | POA: Insufficient documentation

## 2012-09-08 DIAGNOSIS — Y929 Unspecified place or not applicable: Secondary | ICD-10-CM | POA: Insufficient documentation

## 2012-09-08 DIAGNOSIS — S43499A Other sprain of unspecified shoulder joint, initial encounter: Secondary | ICD-10-CM | POA: Insufficient documentation

## 2012-09-08 DIAGNOSIS — W1789XA Other fall from one level to another, initial encounter: Secondary | ICD-10-CM | POA: Insufficient documentation

## 2012-09-08 DIAGNOSIS — Z79899 Other long term (current) drug therapy: Secondary | ICD-10-CM | POA: Insufficient documentation

## 2012-09-08 DIAGNOSIS — F17200 Nicotine dependence, unspecified, uncomplicated: Secondary | ICD-10-CM | POA: Insufficient documentation

## 2012-09-08 MED ORDER — DEXAMETHASONE 6 MG PO TABS: ORAL_TABLET | ORAL | Status: DC

## 2012-09-08 MED ORDER — DEXAMETHASONE SODIUM PHOSPHATE 4 MG/ML IJ SOLN
8.0000 mg | Freq: Once | INTRAMUSCULAR | Status: AC
  Administered 2012-09-08: 8 mg via INTRAMUSCULAR
  Filled 2012-09-08: qty 1

## 2012-09-08 MED ORDER — DIAZEPAM 5 MG PO TABS
5.0000 mg | ORAL_TABLET | Freq: Once | ORAL | Status: AC
  Administered 2012-09-08: 5 mg via ORAL
  Filled 2012-09-08: qty 1

## 2012-09-08 MED ORDER — HYDROCODONE-ACETAMINOPHEN 5-325 MG PO TABS
2.0000 | ORAL_TABLET | Freq: Once | ORAL | Status: AC
  Administered 2012-09-08: 2 via ORAL
  Filled 2012-09-08: qty 2

## 2012-09-08 MED ORDER — DOXYCYCLINE HYCLATE 100 MG PO TABS
100.0000 mg | ORAL_TABLET | Freq: Once | ORAL | Status: AC
  Administered 2012-09-08: 100 mg via ORAL
  Filled 2012-09-08: qty 1

## 2012-09-08 MED ORDER — HYDROCODONE-ACETAMINOPHEN 5-325 MG PO TABS: ORAL_TABLET | ORAL | Status: DC

## 2012-09-08 MED ORDER — DOXYCYCLINE HYCLATE 100 MG PO CAPS: 100.0000 mg | ORAL_CAPSULE | Freq: Two times a day (BID) | ORAL | Status: AC

## 2012-09-08 MED ORDER — ALBUTEROL SULFATE HFA 108 (90 BASE) MCG/ACT IN AERS
2.0000 | INHALATION_SPRAY | RESPIRATORY_TRACT | Status: DC
  Administered 2012-09-08: 2 via RESPIRATORY_TRACT
  Filled 2012-09-08: qty 6.7

## 2012-09-08 NOTE — Progress Notes (Signed)
Instructed patient on use of Albuterol MDI with spacer. She was able to demonstrate her understanding.

## 2012-09-08 NOTE — ED Notes (Addendum)
Tripped yesterday, now having pain lt side upper back,  Alert,No HI, no loc  Increase pain with deep breath.

## 2012-09-08 NOTE — ED Provider Notes (Signed)
History     CSN: [id number]  Arrival date & time 09/08/12  1458   First MD Initiated Contact with Patient 09/08/12 1526      Chief Complaint  Patient presents with  . Fall    (Consider location/radiation/quality/duration/timing/severity/associated sxs/prior treatment) Patient is a 51 y.o. female presenting with fall. The history is provided by the patient.  Fall The accident occurred yesterday. Distance fallen: fell from standing position. She landed on a hard floor. Point of impact: left back. Pain location: left shoulder and left upper rib area  The pain is moderate. She was ambulatory at the scene. Pertinent negatives include no fever, no numbness, no abdominal pain, no bowel incontinence, no vomiting, no hematuria and no headaches. Exacerbated by: deep breathing. She has tried nothing for the symptoms.    Past Medical History  Diagnosis Date  . Back pain   . Depression   . Hypertension     Past Surgical History  Procedure Date  . Foot surgery   . Knee surgery   . Shoulder surgery   . Ankle surgery     History reviewed. No pertinent family history.  History  Substance Use Topics  . Smoking status: Current Every Day Smoker    Types: Cigarettes  . Smokeless tobacco: Not on file  . Alcohol Use: No    OB History    Grav Para Term Preterm Abortions TAB SAB Ect Mult Living                  Review of Systems  Constitutional: Negative for fever and activity change.       All ROS Neg except as noted in HPI  HENT: Negative for nosebleeds and neck pain.   Eyes: Negative for photophobia and discharge.  Respiratory: Negative for cough, shortness of breath and wheezing.   Cardiovascular: Negative for chest pain and palpitations.  Gastrointestinal: Negative for vomiting, abdominal pain, blood in stool and bowel incontinence.  Genitourinary: Negative for dysuria, frequency and hematuria.  Musculoskeletal: Negative for back pain and arthralgias.  Skin: Negative.     Neurological: Negative for dizziness, seizures, speech difficulty, numbness and headaches.  Psychiatric/Behavioral: Negative for hallucinations and confusion.    Allergies  Review of patient's allergies indicates no known allergies.  Home Medications   Current Outpatient Rx  Name  Route  Sig  Dispense  Refill  . ALBUTEROL SULFATE (2.5 MG/3ML) 0.083% IN NEBU   Nebulization   Take 2.5 mg by nebulization every 6 (six) hours as needed.         Maximino Greenland 18-103 MCG/ACT IN AERO   Inhalation   Inhale 2 puffs into the lungs every 6 (six) hours as needed. wheezing         . AMLODIPINE BESYLATE 5 MG PO TABS   Oral   Take 5 mg by mouth daily.         Marland Kitchen CITALOPRAM HYDROBROMIDE 40 MG PO TABS   Oral   Take 40 mg by mouth daily.         . IPRATROPIUM BROMIDE 0.02 % IN SOLN   Nebulization   Take 500 mcg by nebulization 4 (four) times daily.         Marland Kitchen METHOCARBAMOL 500 MG PO TABS   Oral   Take 500-1,000 mg by mouth 2 (two) times daily.          Marland Kitchen OMEPRAZOLE 20 MG PO CPDR   Oral   Take 20 mg by mouth daily.         Marland Kitchen  TRAMADOL HCL 50 MG PO TABS   Oral   Take 50 mg by mouth every 6 (six) hours as needed. pain         . TRAZODONE HCL 100 MG PO TABS   Oral   Take 200 mg by mouth at bedtime.          Marland Kitchen DICLOFENAC SODIUM 75 MG PO TBEC   Oral   Take 75 mg by mouth 2 (two) times daily.           BP 123/70  Pulse 75  Temp 98.1 F (36.7 C) (Oral)  Resp 20  Ht 4\' 11"  (1.499 m)  Wt 130 lb (58.968 kg)  BMI 26.26 kg/m2  SpO2 95%  Physical Exam  Nursing note and vitals reviewed. Constitutional: She is oriented to person, place, and time. She appears well-developed and well-nourished.  Non-toxic appearance.  HENT:  Head: Normocephalic.  Right Ear: Tympanic membrane and external ear normal.  Left Ear: Tympanic membrane and external ear normal.  Eyes: EOM and lids are normal. Pupils are equal, round, and reactive to light.  Neck: Normal range of  motion. Neck supple. Carotid bruit is not present.  Cardiovascular: Normal rate, regular rhythm, normal heart sounds, intact distal pulses and normal pulses.   Pulmonary/Chest: Breath sounds normal. No respiratory distress.  Abdominal: Soft. Bowel sounds are normal. There is no tenderness. There is no guarding.  Musculoskeletal: Normal range of motion.  Lymphadenopathy:       Head (right side): No submandibular adenopathy present.       Head (left side): No submandibular adenopathy present.    She has no cervical adenopathy.  Neurological: She is alert and oriented to person, place, and time. She has normal strength. No cranial nerve deficit or sensory deficit.  Skin: Skin is warm and dry.  Psychiatric: She has a normal mood and affect. Her speech is normal.    ED Course  Procedures (including critical care time)  Labs Reviewed - No data to display No results found.   No diagnosis found.    MDM  I have reviewed nursing notes, vital signs, and all appropriate lab and imaging results for this patient. Pt tripped yesterday and sustained upper back pain. Chest xray is neg for acute problem. There is emphysema present. Pulse ox 95%. Pt has symptoms of bronchitis.  Ddecadron, doxycycline, and norco given to the patient.       Kathie Dike, [place] 09/09/12 1304

## 2012-09-08 NOTE — ED Notes (Signed)
Pt presents after fall yesterday with c/o left shoulder/scapula pain and upper left rib pain.  No deformity noted at this time. Pt has limited ROM, pt states has "trouble with shoulders from time to time".  Pt also c/o pain with deep breathing. SAO2 96 on room air. BBS with fine rales throughout lung field. Denies SOB and other injuries at this time.

## 2012-09-12 NOTE — ED Provider Notes (Signed)
Medical screening examination/treatment/procedure(s) were performed by non-physician practitioner and as supervising physician I was immediately available for consultation/collaboration.   Shelda Jakes, MD 09/12/12 [phone]

## 2012-10-03 ENCOUNTER — Emergency Department (HOSPITAL_COMMUNITY)
Admission: EM | Admit: 2012-10-03 | Discharge: 2012-10-03 | Discharge status: Against Medical Advice | Payer: Medicaid Other | Attending: Emergency Medicine | Admitting: Emergency Medicine

## 2012-10-03 ENCOUNTER — Encounter (HOSPITAL_COMMUNITY): Payer: Self-pay

## 2012-10-03 DIAGNOSIS — M549 Dorsalgia, unspecified: Secondary | ICD-10-CM | POA: Insufficient documentation

## 2012-10-03 NOTE — ED Notes (Signed)
Complain of back pain in pain in left shoulder

## 2013-08-22 ENCOUNTER — Telehealth: Payer: Self-pay | Admitting: Family Medicine

## 2013-08-22 NOTE — Telephone Encounter (Signed)
Spoke with pt to inform we can establish her as a pt after old balance is pd and provider name on M[facility] switched to [facility] Informed pt that we do not do pain management but we can refer to pain management clinic Pt verbalizes understanding

## 2013-09-19 ENCOUNTER — Encounter: Payer: Self-pay | Admitting: Family Medicine

## 2013-09-19 ENCOUNTER — Ambulatory Visit (INDEPENDENT_AMBULATORY_CARE_PROVIDER_SITE_OTHER): Payer: Medicaid Other | Admitting: Family Medicine

## 2013-09-19 ENCOUNTER — Encounter (INDEPENDENT_AMBULATORY_CARE_PROVIDER_SITE_OTHER): Payer: Self-pay

## 2013-09-19 VITALS — BP 126/80 | HR 84 | Temp 98.0°F | Ht 61.0 in | Wt 143.0 lb

## 2013-09-19 DIAGNOSIS — R7309 Other abnormal glucose: Secondary | ICD-10-CM

## 2013-09-19 DIAGNOSIS — K219 Gastro-esophageal reflux disease without esophagitis: Secondary | ICD-10-CM

## 2013-09-19 DIAGNOSIS — J449 Chronic obstructive pulmonary disease, unspecified: Secondary | ICD-10-CM

## 2013-09-19 DIAGNOSIS — F39 Unspecified mood [affective] disorder: Secondary | ICD-10-CM

## 2013-09-19 DIAGNOSIS — I10 Essential (primary) hypertension: Secondary | ICD-10-CM

## 2013-09-19 LAB — POCT CBC
Granulocyte percent: 64.5 %G (ref 37–80)
HCT, POC: 46.8 % (ref 37.7–47.9)
Hemoglobin: 14.7 g/dL (ref 12.2–16.2)
POC Granulocyte: 5.7 (ref 2–6.9)
RBC: 5 M/uL (ref 4.04–5.48)
RDW, POC: 13.4 %

## 2013-09-19 MED ORDER — ALBUTEROL SULFATE (2.5 MG/3ML) 0.083% IN NEBU: 2.5000 mg | INHALATION_SOLUTION | Freq: Four times a day (QID) | RESPIRATORY_TRACT | Status: DC | PRN

## 2013-09-19 MED ORDER — OMEPRAZOLE 20 MG PO CPDR: 20.0000 mg | DELAYED_RELEASE_CAPSULE | Freq: Every day | ORAL | Status: DC

## 2013-09-19 MED ORDER — IPRATROPIUM BROMIDE 0.02 % IN SOLN: 500.0000 ug | Freq: Four times a day (QID) | RESPIRATORY_TRACT | Status: DC

## 2013-09-19 MED ORDER — IPRATROPIUM-ALBUTEROL 18-103 MCG/ACT IN AERO: 2.0000 | INHALATION_SPRAY | Freq: Four times a day (QID) | RESPIRATORY_TRACT | Status: DC | PRN

## 2013-09-19 MED ORDER — CITALOPRAM HYDROBROMIDE 40 MG PO TABS: 40.0000 mg | ORAL_TABLET | Freq: Every day | ORAL | Status: DC

## 2013-09-19 MED ORDER — AMLODIPINE BESYLATE 10 MG PO TABS: 5.0000 mg | ORAL_TABLET | Freq: Every day | ORAL | Status: DC

## 2013-09-19 NOTE — Progress Notes (Signed)
New Patient History and Physical  Patient name: Brenda Duran Medical record number: [id number] Date of birth: 11/10/1960 Age: 52 y.o. Gender: female  Primary Care Provider: Rudi Heap, MD  Chief Complaint: establish care   History of Present Illness: Patient presents today to establish care. No acute issues today. Baseline history of chronic pain, COPD with active one pack per day smoking, reflux, hypertension. Previously a patient at [facility]. Not checking BP on a regular basis No CP or SOB Reports having mammogram last year.  Otherwise needs colonoscopy as well as flu, tdap, pneumovax.  Is in process of going to pain management for chronic pain, but needs a referral.  Trying to quit smoking, but still with 1PPD use.  Persistent daily wheeze     Past Medical History: There are no active problems to display for this patient.  Past Medical History  Diagnosis Date  . Back pain   . Depression   . Hypertension     Past Surgical History: Past Surgical History  Procedure Laterality Date  . Foot surgery    . Knee surgery    . Shoulder surgery    . Ankle surgery    . Breast surgery Left 2012    lumpectomy    Social History: History   Social History  . Marital Status: Single    Spouse Name: N/A    Number of Children: N/A  . Years of Education: N/A   Social History Main Topics  . Smoking status: Current Every Day Smoker    Types: Cigarettes  . Smokeless tobacco: None  . Alcohol Use: No  . Drug Use: No  . Sexual Activity: None   Other Topics Concern  . None   Social History Narrative  . None    Family History: Family History  Problem Relation Age of Onset  . Cancer Mother   . Cancer Father     Allergies: No Known Allergies  Current Outpatient Prescriptions  Medication Sig Dispense Refill  . albuterol (PROVENTIL) (2.5 MG/3ML) 0.083% nebulizer solution Take 2.5 mg by nebulization every 6 (six) hours as needed.      Marland Kitchen  albuterol-ipratropium (COMBIVENT) 18-103 MCG/ACT inhaler Inhale 2 puffs into the lungs every 6 (six) hours as needed. wheezing      . amLODipine (NORVASC) 5 MG tablet Take 5 mg by mouth daily.      . citalopram (CELEXA) 40 MG tablet Take 40 mg by mouth daily.      . diclofenac (VOLTAREN) 75 MG EC tablet Take 75 mg by mouth 2 (two) times daily.      Marland Kitchen HYDROcodone-acetaminophen (NORCO) 5-325 MG per tablet 1 po q4h prn pain  20 tablet  0  . ipratropium (ATROVENT) 0.02 % nebulizer solution Take 500 mcg by nebulization 4 (four) times daily.      . methocarbamol (ROBAXIN) 500 MG tablet Take 500-1,000 mg by mouth 2 (two) times daily.       Marland Kitchen omeprazole (PRILOSEC) 20 MG capsule Take 20 mg by mouth daily.      . traMADol (ULTRAM) 50 MG tablet Take 50 mg by mouth every 6 (six) hours as needed. pain      . traZODone (DESYREL) 100 MG tablet Take 200 mg by mouth at bedtime.        No current facility-administered medications for this visit.   Review Of Systems: 12 point ROS negative except as noted above in HPI.  Physical Exam: Filed Vitals:   09/19/13 1419  BP:  126/80  Pulse: 84  Temp: 98 F (36.7 C)    General: alert and appears older than stated age HEENT: PERRLA, extra ocular movement intact and trachea midline Heart: S1, S2 normal, no murmur, rub or gallop, regular rate and rhythm Lungs: unlabored breathing and wheezing Abdomen: abdomen is soft without significant tenderness, masses, organomegaly or guarding Extremities: extremities normal, atraumatic, no cyanosis or edema Skin:no rashes, no ecchymoses Neurology: normal without focal findings  Labs and Imaging:  Assessment and Plan: HTN (hypertension) - Plan: POCT CBC, Comprehensive metabolic panel, NMR, lipoprofile, POCT glycosylated hemoglobin (Hb A1C), amLODipine (NORVASC) 10 MG tablet  COPD (chronic obstructive pulmonary disease) - Plan: ipratropium (ATROVENT) 0.02 % nebulizer solution, albuterol-ipratropium (COMBIVENT) 18-103  MCG/ACT inhaler, albuterol (PROVENTIL) (2.5 MG/3ML) 0.083% nebulizer solution  Mood disorder - Plan: citalopram (CELEXA) 40 MG tablet  GERD (gastroesophageal reflux disease) - Plan: omeprazole (PRILOSEC) 20 MG capsule  Meds refilled today apart from chronic pain  Check baseline labs including LDL, A1c Would recommend daily ASA, but pt reports ? Hx/o stomach ulcers in the past.  Immunizations and colonoscopy info.  Otherwise follow up in 3 months pending labs.         Doree Albee MD

## 2013-09-20 LAB — NMR, LIPOPROFILE
Cholesterol: 139 mg/dL (ref <200)
LDL Particle Number: 726 nmol/L (ref <1000)
LDL Size: 20.5 nm — ABNORMAL LOW (ref >20.5)
LDLC SERPL CALC-MCNC: 39 mg/dL (ref <100)
Triglycerides by NMR: 187 mg/dL — ABNORMAL HIGH (ref <150)

## 2013-09-20 LAB — COMPREHENSIVE METABOLIC PANEL
Albumin: 4.4 g/dL (ref 3.5–5.5)
BUN: 20 mg/dL (ref 6–24)
Chloride: 97 mmol/L (ref 97–108)
Creatinine, Ser: 0.52 mg/dL — ABNORMAL LOW (ref 0.57–1.00)
GFR calc Af Amer: 128 mL/min/{1.73_m2} (ref >59)
Globulin, Total: 2.1 g/dL (ref 1.5–4.5)
Glucose: 102 mg/dL — ABNORMAL HIGH (ref 65–99)
Total Bilirubin: <0.2 mg/dL (ref 0.0–1.2)
Total Protein: 6.5 g/dL (ref 6.0–8.5)

## 2013-09-26 ENCOUNTER — Telehealth: Payer: Self-pay | Admitting: Family Medicine

## 2013-09-27 NOTE — Telephone Encounter (Signed)
Vm full- needs labs results

## 2013-10-09 ENCOUNTER — Telehealth: Payer: Self-pay | Admitting: Family Medicine

## 2013-10-09 DIAGNOSIS — M549 Dorsalgia, unspecified: Secondary | ICD-10-CM

## 2013-10-09 NOTE — Telephone Encounter (Signed)
Labs discussed. Patient was anticipating a pain management referral for chronic back pain. Referral placed. Patient aware that it will take a while for pain management referral.

## 2013-10-22 ENCOUNTER — Other Ambulatory Visit: Payer: Medicaid Other | Admitting: General Practice

## 2013-10-23 ENCOUNTER — Encounter: Payer: Self-pay | Admitting: *Deleted

## 2013-10-29 ENCOUNTER — Encounter: Payer: Self-pay | Admitting: General Practice

## 2013-10-29 ENCOUNTER — Ambulatory Visit (INDEPENDENT_AMBULATORY_CARE_PROVIDER_SITE_OTHER): Payer: Medicaid Other | Admitting: General Practice

## 2013-10-29 VITALS — BP 104/65 | HR 82 | Temp 97.7°F | Ht 61.0 in | Wt 140.0 lb

## 2013-10-29 DIAGNOSIS — Z1211 Encounter for screening for malignant neoplasm of colon: Secondary | ICD-10-CM

## 2013-10-29 DIAGNOSIS — Z0000 Encounter for general adult medical examination without abnormal findings: Secondary | ICD-10-CM

## 2013-10-29 DIAGNOSIS — Z01419 Encounter for gynecological examination (general) (routine) without abnormal findings: Secondary | ICD-10-CM

## 2013-10-29 DIAGNOSIS — Z1212 Encounter for screening for malignant neoplasm of rectum: Secondary | ICD-10-CM

## 2013-10-29 DIAGNOSIS — J449 Chronic obstructive pulmonary disease, unspecified: Secondary | ICD-10-CM

## 2013-10-29 MED ORDER — IPRATROPIUM BROMIDE 0.02 % IN SOLN: 500.0000 ug | Freq: Four times a day (QID) | RESPIRATORY_TRACT | Status: DC

## 2013-10-29 MED ORDER — ALBUTEROL SULFATE (2.5 MG/3ML) 0.083% IN NEBU: 2.5000 mg | INHALATION_SOLUTION | Freq: Four times a day (QID) | RESPIRATORY_TRACT | Status: DC | PRN

## 2013-10-30 NOTE — Progress Notes (Signed)
   Subjective:    Patient ID: Brenda Duran, female    DOB: 1961-09-01, 53 y.o.   MRN: [id number]  HPI Patient presents today for annual exam. She denies problems or concerns at this time. Needs refill on albuterol and combivent.     Review of Systems  Constitutional: Negative for fever and chills.  Respiratory: Negative for chest tightness and shortness of breath.   Cardiovascular: Negative for chest pain and palpitations.  Gastrointestinal: Negative for nausea, vomiting, abdominal pain, diarrhea, constipation and blood in stool.  Genitourinary: Negative for dysuria and difficulty urinating.  Neurological: Negative for dizziness, weakness and headaches.       Objective:   Physical Exam  Constitutional: She is oriented to person, place, and time. She appears well-developed and well-nourished.  HENT:  Head: Normocephalic and atraumatic.  Right Ear: External ear normal.  Left Ear: External ear normal.  Mouth/Throat: Oropharynx is clear and moist.  Eyes: Conjunctivae and EOM are normal. Pupils are equal, round, and reactive to light.  Neck: Normal range of motion. Neck supple. No thyromegaly present.  Cardiovascular: Normal rate, regular rhythm, normal heart sounds and intact distal pulses.   Pulmonary/Chest: Effort normal and breath sounds normal. No respiratory distress. She exhibits no tenderness. Right breast exhibits no inverted nipple, no mass, no nipple discharge, no skin change and no tenderness. Left breast exhibits no inverted nipple, no mass, no nipple discharge, no skin change and no tenderness. Breasts are symmetrical.  Abdominal: Soft. Bowel sounds are normal. She exhibits no distension.  Genitourinary: Vagina normal and uterus normal. No breast swelling, tenderness, discharge or bleeding. No labial fusion. There is no rash, tenderness, lesion or injury on the right labia. There is no rash, tenderness, lesion or injury on the left labia. Uterus is not deviated, not enlarged,  not fixed and not tender. Cervix exhibits no motion tenderness, no discharge and no friability. Right adnexum displays no mass, no tenderness and no fullness. Left adnexum displays no mass, no tenderness and no fullness. No erythema, tenderness or bleeding around the vagina. No foreign body around the vagina. No signs of injury around the vagina. No vaginal discharge found.  Lymphadenopathy:    She has no cervical adenopathy.  Neurological: She is alert and oriented to person, place, and time.  Skin: Skin is warm and dry.  Psychiatric: She has a normal mood and affect.          Assessment & Plan:  1. Annual physical exam  - Pap IG w/ reflex to HPV when ASC-U  2. Screening for colorectal cancer  - Ambulatory referral to Gastroenterology  3. COPD (chronic obstructive pulmonary disease)  - ipratropium (ATROVENT) 0.02 % nebulizer solution; Take 2.5 mLs (500 mcg total) by nebulization 4 (four) times daily.  Dispense: 312 mL; Refill: 3 - albuterol (PROVENTIL) (2.5 MG/3ML) 0.083% nebulizer solution; Take 3 mLs (2.5 mg total) by nebulization every 6 (six) hours as needed.  Dispense: 375 mL; Refill: 3 -RTO prn -Patient verbalized understanding Erby Pian, FNP-C

## 2013-11-01 LAB — PAP IG W/ RFLX HPV ASCU: PAP SMEAR COMMENT: 0

## 2013-11-02 ENCOUNTER — Other Ambulatory Visit: Payer: Self-pay | Admitting: General Practice

## 2013-11-02 ENCOUNTER — Telehealth: Payer: Self-pay | Admitting: *Deleted

## 2013-11-02 DIAGNOSIS — R87622 Low grade squamous intraepithelial lesion on cytologic smear of vagina (LGSIL): Secondary | ICD-10-CM

## 2013-11-02 NOTE — Telephone Encounter (Signed)
Patient aware of pap results.  She wants to go to the closest Gyn possible.  If we still have one next door that is where she wants to go and [facility] would be second choice.  Was referral for colonoscopy started?  She still wants to have that done.

## 2013-11-02 NOTE — Telephone Encounter (Signed)
Yes. Referral request made already for colonoscopy and she will be contacted about both appointments.

## 2013-11-02 NOTE — Telephone Encounter (Signed)
Patient aware.

## 2013-11-16 ENCOUNTER — Other Ambulatory Visit: Payer: Self-pay | Admitting: General Practice

## 2013-11-16 NOTE — Progress Notes (Signed)
Please inform patient that the referral to GI wasn't accepted from this office, because we aren't listed on her medicaid card. She will need to have information changed on card or ask current office listed make referral.

## 2013-11-19 NOTE — Progress Notes (Signed)
Patient has spoke with the case manager twice to have the name changed. She will call them again and will let us know when this is done.

## 2013-11-27 ENCOUNTER — Ambulatory Visit: Payer: Medicaid Other | Admitting: Obstetrics & Gynecology

## 2013-12-04 ENCOUNTER — Ambulatory Visit: Payer: Medicaid Other | Admitting: Obstetrics & Gynecology

## 2013-12-11 ENCOUNTER — Telehealth (HOSPITAL_COMMUNITY): Payer: Self-pay | Admitting: Obstetrics & Gynecology

## 2013-12-11 ENCOUNTER — Encounter: Payer: Medicaid Other | Admitting: Obstetrics & Gynecology

## 2013-12-17 ENCOUNTER — Encounter: Payer: Medicaid Other | Admitting: Obstetrics & Gynecology

## 2014-01-01 ENCOUNTER — Encounter: Payer: Medicaid Other | Admitting: Obstetrics & Gynecology

## 2014-01-01 ENCOUNTER — Ambulatory Visit (INDEPENDENT_AMBULATORY_CARE_PROVIDER_SITE_OTHER): Payer: Medicaid Other | Admitting: Obstetrics & Gynecology

## 2014-01-01 ENCOUNTER — Encounter: Payer: Self-pay | Admitting: Obstetrics & Gynecology

## 2014-01-01 VITALS — BP 122/84 | HR 55 | Resp 16 | Ht 62.0 in | Wt 141.0 lb

## 2014-01-01 DIAGNOSIS — R87612 Low grade squamous intraepithelial lesion on cytologic smear of cervix (LGSIL): Secondary | ICD-10-CM | POA: Insufficient documentation

## 2014-01-01 NOTE — Progress Notes (Signed)
Patient ID: Brenda Duran, female   DOB: 01-Apr-1961, 53 y.o.   MRN: [id number]  Chief Complaint  Patient presents with  . Colposcopy    HPI Brenda Duran is a 53 y.o. female.  No obstetric history on file. No LMP recorded. Patient is postmenopausal.at age 89   HPI  Indications: Pap smear on January 2015 showed: low-grade squamous intraepithelial neoplasia (LGSIL - encompassing HPV,mild dysplasia,CIN I). Previous colposcopy: no. Prior cervical treatment: n/a.  Past Medical History  Diagnosis Date  . Back pain   . Depression   . Hypertension     Past Surgical History  Procedure Laterality Date  . Foot surgery    . Knee surgery    . Shoulder surgery    . Ankle surgery    . Breast surgery Left 2012    lumpectomy    Family History  Problem Relation Age of Onset  . Cancer Mother   . Cancer Father     Social History History  Substance Use Topics  . Smoking status: Current Every Day Smoker    Types: Cigarettes  . Smokeless tobacco: Not on file  . Alcohol Use: No    No Known Allergies  Current Outpatient Prescriptions  Medication Sig Dispense Refill  . albuterol (PROVENTIL) (2.5 MG/3ML) 0.083% nebulizer solution Take 3 mLs (2.5 mg total) by nebulization every 6 (six) hours as needed.  375 mL  3  . albuterol-ipratropium (COMBIVENT) 18-103 MCG/ACT inhaler Inhale 2 puffs into the lungs every 6 (six) hours as needed. wheezing  14.7 g  6  . amLODipine (NORVASC) 10 MG tablet Take 0.5 tablets (5 mg total) by mouth daily.  30 tablet  6  . citalopram (CELEXA) 40 MG tablet Take 1 tablet (40 mg total) by mouth daily.  30 tablet  6  . diclofenac (VOLTAREN) 75 MG EC tablet Take 75 mg by mouth 2 (two) times daily.      Marland Kitchen HYDROcodone-acetaminophen (NORCO) 5-325 MG per tablet 1 po q4h prn pain  20 tablet  0  . ipratropium (ATROVENT) 0.02 % nebulizer solution Take 2.5 mLs (500 mcg total) by nebulization 4 (four) times daily.  312 mL  3  . methocarbamol (ROBAXIN) 500 MG tablet Take  500-1,000 mg by mouth 2 (two) times daily.       Marland Kitchen omeprazole (PRILOSEC) 20 MG capsule Take 1 capsule (20 mg total) by mouth daily.  30 capsule  6  . traMADol (ULTRAM) 50 MG tablet Take 50 mg by mouth every 6 (six) hours as needed. pain      . traZODone (DESYREL) 100 MG tablet Take 200 mg by mouth at bedtime.        No current facility-administered medications for this visit.    Review of Systems Review of Systems  Blood pressure 122/84, pulse 55, resp. rate 16, height 5\' 2"  (1.575 m), weight 141 lb (63.957 kg).  Physical Exam Physical Exam  Data Reviewed Pap result  Assessment    Procedure Details  The risks and benefits of the procedure and Written informed consent obtained.  Speculum placed in vagina and excellent visualization of cervix achieved, cervix swabbed x 3 with acetic acid solution.  Specimens: Bx at 1000, Unable to do ECC tight cervix, no AWE lesion, minimal inflammation  Complications: none.     Plan    Specimens labelled and sent to Pathology. Return to discuss Pathology results in 2 weeks.   Call pt with result and f/u plan   ARNOLD,JAMES 01/01/2014, 12:21  PM

## 2014-01-01 NOTE — Patient Instructions (Signed)
Abnormal Pap Test Information During a Pap test, the cells on the surface of your cervix are checked to see if they look normal, abnormal, or if they show signs of having been altered by a certain type of virus called human papillomavirus, or HPV. Cervical cells that have been affected by HPV are called dysplasia. Dysplasia is not cancer, but describes abnormal cells found on the surface of the cervix. Depending on the degree of dysplasia, some of the cells may be considered pre-cancerous and may turn into cancer over time if follow up with a caregiver is delayed.  WHAT DOES AN ABNORMAL PAP TEST MEAN? Having an abnormal pap test does not mean that you have cancer. However, certain types of abnormal pap tests can be a sign that a person is at a higher risk of developing cancer. Your caregiver will want to do other tests to find out more about the abnormal cells. Your abnormal Pap test results could show:   Small and uncertain changes that should be carefully watched.   Cervical dysplasia that has caused mild changes and can be followed over time.  Cervical dysplasia that is more severe and needs to be followed and treated to ensure the problem goes away.  Cancer.  When severe cervical dysplasia is found and treated early, it rarely will grow into cancer.  WHAT WILL BE DONE ABOUT MY ABNORMAL PAP TEST?  A colposcopy may be needed. This is a procedure where your cervix is examined using light and magnification.  A small tissue sample of your cervix (biopsy) may need to be removed and then examined. This is often performed if there are areas that appear infected.  A sample of cells from the cervical canal may be removed with either a small brush or scraping instrument (curette). Based on the results of the procedures above, some caregivers may recommend either cryotherapy of the cervix or a surgical LEEP where a portion of the cervix is removed. LEEP is short for "loop electrical excisional  procedure." Rarely, a caregiver may recommend a cone biopsy.This is a procedure where a small, cone-shaped sample of your cervix is taken out. The part that is taken out is the area where the abnormal cells are.  WHAT IF I HAVE A DYSPLASIA OR A CANCER? You may be referred to a specialist. Radiation may also be a treatment for more advanced cancer. Having a hysterectomy is the last treatment option for dysplasia, but it is a more common treatment for someone with cancer. All treatment options will be discussed with you by your caregiver. WHAT SHOULD YOU DO AFTER BEING TREATED? If you have had an abnormal pap test, you should continue to have regular pap tests and check-ups as directed by your caregiver. Your cervical problem will be carefully watched so it does not get worse. Also, your caregiver can watch for, and treat, any new problems that may come up. Document Released: 01/19/2011 Document Revised: 01/29/2013 Document Reviewed: 09/30/2011 [employer] Patient Information 2014 [employer].  

## 2014-01-03 NOTE — Patient Instructions (Signed)
Colposcopy  Colposcopy is a procedure to examine your cervix and vagina, or the area around the outside of your vagina, for abnormalities or signs of disease. The procedure is done using a lighted microscope called a colposcope. Tissue samples may be collected during the colposcopy if your health care provider finds any unusual cells. A colposcopy may be done if a woman has:   An abnormal Pap test. A Pap test is a medical test done to evaluate cells that are on the surface of the cervix.   A Pap test result that is suggestive of human papillomavirus (HPV). This virus can cause genital warts and is linked to the development of cervical cancer.   A sore on her cervix and the results of a Pap test were normal.   Genital warts on the cervix or in or around the outside of the vagina.   A mother who took the drug diethylstilbestrol (DES) while pregnant.   Painful intercourse.   Vaginal bleeding, especially after sexual intercourse.  LET YOUR HEALTH CARE PROVIDER KNOW ABOUT:   Any allergies you have.   All medicines you are taking, including vitamins, herbs, eye drops, creams, and over-the-counter medicines.   Previous problems you or members of your family have had with the use of anesthetics.   Any blood disorders you have.   Previous surgeries you have had.   Medical conditions you have.  RISKS AND COMPLICATIONS  Generally, a colposcopy is a safe procedure. However, as with any procedure, complications can occur. Possible complications include:   Bleeding.   Infection.   Missed lesions.  BEFORE THE PROCEDURE    Tell your health care provider if you have your menstrual period. A colposcopy typically is not done during menstruation.   For 24 hours before the colposcopy, do not:   [place]ouche.   Use tampons.   Use medicines, creams, or suppositories in the vagina.   Have sexual intercourse.  PROCEDURE   During the procedure, you will be lying on your back with your feet in foot rests (stirrups). A warm  metal or plastic instrument (speculum) will be placed in your vagina to keep it open and to allow the health care provider to see the cervix. The colposcope will be placed outside the vagina. It will be used to magnify and examine the cervix, vagina, and the area around the outside of the vagina. A small amount of liquid solution will be placed on the area that is to be viewed. This solution will make it easier to see the abnormal cells. Your health care provider will use tools to suck out mucus and cells from the canal of the cervix. Then he or she will record the location of the abnormal areas.  If a biopsy is done during the procedure, a medicine will usually be given to numb the area (local anesthetic). You may feel mild pain or cramping while the biopsy is done. After the procedure, tissue samples collected during the biopsy will be sent to a lab for analysis.  AFTER THE PROCEDURE   You will be given instructions on when to follow up with your health care provider for your test results. It is important to keep your appointment.  Document Released: 12/25/2002 Document Revised: 06/06/2013 Document Reviewed: 05/03/2013  [employer] Patient Information 2014 [employer].

## 2014-01-09 NOTE — Progress Notes (Signed)
This encounter was created in error - please disregard.

## 2014-01-10 ENCOUNTER — Telehealth: Payer: Self-pay | Admitting: *Deleted

## 2014-01-10 NOTE — Telephone Encounter (Signed)
Message copied by Gretchen Short on Thu Jan 10, 2014  8:20 AM ------      Message from: Woodroe Mode      Created: Wed Jan 09, 2014 11:51 AM       cotesting in 12 month, please notify patient ------

## 2014-01-10 NOTE — Telephone Encounter (Signed)
Called pt to go over pathology. Explained to pt that she will need co-testing in 1 year. Pt expressed understanding.

## 2014-01-11 ENCOUNTER — Telehealth: Payer: Self-pay | Admitting: General Practice

## 2014-01-11 NOTE — Telephone Encounter (Signed)
appt with newton on monday

## 2014-01-14 ENCOUNTER — Ambulatory Visit: Payer: Medicaid Other | Admitting: Family Medicine

## 2014-02-27 ENCOUNTER — Telehealth: Payer: Self-pay

## 2014-02-27 NOTE — Telephone Encounter (Signed)
Patient said she is suppose to be getting a referral for pain management   Nothing in workqueue  Last saw Mae

## 2014-02-27 NOTE — Telephone Encounter (Signed)
See referral from 10/09/13

## 2014-03-04 ENCOUNTER — Ambulatory Visit (INDEPENDENT_AMBULATORY_CARE_PROVIDER_SITE_OTHER): Payer: Medicaid Other | Admitting: Family Medicine

## 2014-03-04 ENCOUNTER — Encounter: Payer: Self-pay | Admitting: Family Medicine

## 2014-03-04 VITALS — BP 131/86 | HR 79 | Temp 98.4°F | Ht 61.0 in | Wt 133.2 lb

## 2014-03-04 DIAGNOSIS — L259 Unspecified contact dermatitis, unspecified cause: Secondary | ICD-10-CM

## 2014-03-04 DIAGNOSIS — L309 Dermatitis, unspecified: Secondary | ICD-10-CM

## 2014-03-04 DIAGNOSIS — J209 Acute bronchitis, unspecified: Secondary | ICD-10-CM

## 2014-03-04 DIAGNOSIS — G894 Chronic pain syndrome: Secondary | ICD-10-CM

## 2014-03-04 MED ORDER — METHYLPREDNISOLONE (PAK) 4 MG PO TABS: ORAL_TABLET | ORAL | Status: DC

## 2014-03-04 MED ORDER — AMOXICILLIN 875 MG PO TABS: 875.0000 mg | ORAL_TABLET | Freq: Two times a day (BID) | ORAL | Status: DC

## 2014-03-04 MED ORDER — METHYLPREDNISOLONE ACETATE 80 MG/ML IJ SUSP
80.0000 mg | Freq: Once | INTRAMUSCULAR | Status: AC
  Administered 2014-03-04: 80 mg via INTRAMUSCULAR

## 2014-03-04 NOTE — Progress Notes (Signed)
   Subjective:    Patient ID: Brenda Duran, female    DOB: Oct 21, 1960, 53 y.o.   MRN: [id number]  HPI This 53 y.o. female presents for evaluation of rash on right knee, chronic pain, c/o cough And allergies.  She is a smoker and she uses her nebulizer and saba MDI more frequently recently. She c/o mucopurulent productive cough.   Review of Systems C/o cough, rash, and uri sx's.  C/o chronic pain No chest pain, SOB, HA, dizziness, vision change, N/V, diarrhea, constipation, dysuria, urinary urgency or frequency or rash.     Objective:   Physical Exam Vital signs noted  Well developed well nourished female.  HEENT - Head atraumatic Normocephalic                Eyes - PERRLA, Conjuctiva - clear Sclera- Clear EOMI                Ears - EAC's Wnl TM's Wnl Gross Hearing WNL                Throat - oropharanx wnl Respiratory - Lungs with expiratory wheezes Cardiac - RRR S1 and S2 without murmur GI - Abdomen soft Nontender and bowel sounds active x 4 Extremities - No edema. Neuro - Grossly intact. Skin - Right posterior knee with rash       Assessment & Plan:  Acute bronchitis - Plan: methylPREDNIsolone (MEDROL DOSPACK) 4 MG tablet, methylPREDNISolone acetate (DEPO-MEDROL) injection 80 mg, amoxicillin (AMOXIL) 875 MG tablet Po bid x 10 days Push po fluids, rest, tylenol and motrin otc prn as directed for fever, arthralgias, and myalgias.  Follow up prn if sx's continue or persist.  Chronic pain syndrome - Plan: Ambulatory referral to Pain Clinic  Dermatitis - Plan: methylPREDNIsolone (MEDROL DOSPACK) 4 MG tablet And take benadryl otc  Lysbeth Penner FNP

## 2014-03-29 ENCOUNTER — Ambulatory Visit (INDEPENDENT_AMBULATORY_CARE_PROVIDER_SITE_OTHER): Payer: Medicaid Other | Admitting: Family Medicine

## 2014-03-29 ENCOUNTER — Encounter: Payer: Self-pay | Admitting: Family Medicine

## 2014-03-29 ENCOUNTER — Ambulatory Visit (INDEPENDENT_AMBULATORY_CARE_PROVIDER_SITE_OTHER): Payer: Medicaid Other

## 2014-03-29 ENCOUNTER — Telehealth: Payer: Self-pay | Admitting: Family Medicine

## 2014-03-29 VITALS — BP 107/78 | HR 78 | Temp 98.6°F | Ht 61.0 in | Wt 133.8 lb

## 2014-03-29 DIAGNOSIS — M79641 Pain in right hand: Secondary | ICD-10-CM

## 2014-03-29 DIAGNOSIS — M79609 Pain in unspecified limb: Secondary | ICD-10-CM

## 2014-03-29 DIAGNOSIS — Z1211 Encounter for screening for malignant neoplasm of colon: Secondary | ICD-10-CM

## 2014-03-29 MED ORDER — NAPROXEN 500 MG PO TABS: 500.0000 mg | ORAL_TABLET | Freq: Two times a day (BID) | ORAL | Status: DC

## 2014-03-29 NOTE — Telephone Encounter (Signed)
appt given for today 

## 2014-03-29 NOTE — Progress Notes (Signed)
   Subjective:    Patient ID: Brenda Duran, female    DOB: 03-20-1961, 53 y.o.   MRN: [id number]  HPI This 54 y.o. female presents for evaluation of right hand discomfort and swelling at right second PIP.   Review of Systems C/o right hand discomfort  No chest pain, SOB, HA, dizziness, vision change, N/V, diarrhea, constipation, dysuria, urinary urgency or frequency, myalgias, arthralgias or rash.     Objective:   Physical Exam  Vital signs noted  Well developed well nourished female.  HEENT - Head atraumatic Normocephalic                Eyes - PERRLA, Conjuctiva - clear Sclera- Clear EOMI                Ears - EAC's Wnl TM's Wnl Gross Hearing WNL                Throat - oropharanx wnl Respiratory - Lungs CTA bilateral Cardiac - RRR S1 and S2 without murmur GI - Abdomen soft Nontender and bowel sounds active x 4 Extremities - No edema. Neuro - Grossly intact. MS - Swelling and tenderness right second PIP     Assessment & Plan:  Special screening for malignant neoplasms, colon - Plan: Ambulatory referral to Gastroenterology  Right hand pain - Plan: DG Hand Complete Right, naproxen (NAPROSYN) 500 MG tablet  Lysbeth Penner FNP

## 2014-04-05 ENCOUNTER — Encounter: Payer: Self-pay | Admitting: Internal Medicine

## 2014-04-05 NOTE — Telephone Encounter (Signed)
done

## 2014-04-23 ENCOUNTER — Other Ambulatory Visit: Payer: Self-pay | Admitting: General Practice

## 2014-04-25 ENCOUNTER — Other Ambulatory Visit: Payer: Self-pay | Admitting: General Practice

## 2014-05-22 ENCOUNTER — Ambulatory Visit: Payer: Medicaid Other | Admitting: Family Medicine

## 2014-05-28 ENCOUNTER — Encounter: Payer: Self-pay | Admitting: Family Medicine

## 2014-05-28 ENCOUNTER — Ambulatory Visit (INDEPENDENT_AMBULATORY_CARE_PROVIDER_SITE_OTHER): Payer: Medicaid Other

## 2014-05-28 ENCOUNTER — Ambulatory Visit (INDEPENDENT_AMBULATORY_CARE_PROVIDER_SITE_OTHER): Payer: Medicaid Other | Admitting: Family Medicine

## 2014-05-28 VITALS — BP 118/80 | HR 72 | Temp 98.0°F | Ht 61.0 in | Wt 134.0 lb

## 2014-05-28 DIAGNOSIS — M1990 Unspecified osteoarthritis, unspecified site: Secondary | ICD-10-CM

## 2014-05-28 DIAGNOSIS — F329 Major depressive disorder, single episode, unspecified: Secondary | ICD-10-CM

## 2014-05-28 DIAGNOSIS — M5137 Other intervertebral disc degeneration, lumbosacral region: Secondary | ICD-10-CM

## 2014-05-28 DIAGNOSIS — M8580 Other specified disorders of bone density and structure, unspecified site: Secondary | ICD-10-CM

## 2014-05-28 DIAGNOSIS — M5136 Other intervertebral disc degeneration, lumbar region: Secondary | ICD-10-CM

## 2014-05-28 DIAGNOSIS — M129 Arthropathy, unspecified: Secondary | ICD-10-CM

## 2014-05-28 DIAGNOSIS — I10 Essential (primary) hypertension: Secondary | ICD-10-CM

## 2014-05-28 DIAGNOSIS — F3289 Other specified depressive episodes: Secondary | ICD-10-CM

## 2014-05-28 DIAGNOSIS — M899 Disorder of bone, unspecified: Secondary | ICD-10-CM

## 2014-05-28 DIAGNOSIS — R5381 Other malaise: Secondary | ICD-10-CM

## 2014-05-28 DIAGNOSIS — R5383 Other fatigue: Secondary | ICD-10-CM

## 2014-05-28 DIAGNOSIS — M2042 Other hammer toe(s) (acquired), left foot: Secondary | ICD-10-CM

## 2014-05-28 DIAGNOSIS — J411 Mucopurulent chronic bronchitis: Secondary | ICD-10-CM

## 2014-05-28 DIAGNOSIS — F32A Depression, unspecified: Secondary | ICD-10-CM

## 2014-05-28 DIAGNOSIS — M2040 Other hammer toe(s) (acquired), unspecified foot: Secondary | ICD-10-CM

## 2014-05-28 DIAGNOSIS — M949 Disorder of cartilage, unspecified: Secondary | ICD-10-CM

## 2014-05-28 DIAGNOSIS — M79641 Pain in right hand: Secondary | ICD-10-CM

## 2014-05-28 DIAGNOSIS — F39 Unspecified mood [affective] disorder: Secondary | ICD-10-CM

## 2014-05-28 DIAGNOSIS — M2041 Other hammer toe(s) (acquired), right foot: Secondary | ICD-10-CM

## 2014-05-28 DIAGNOSIS — M79609 Pain in unspecified limb: Secondary | ICD-10-CM

## 2014-05-28 LAB — POCT CBC
Granulocyte percent: 66.5 %G (ref 37–80)
HCT, POC: 43 % (ref 37.7–47.9)
Hemoglobin: 14.4 g/dL (ref 12.2–16.2)
Lymph, poc: 2.5 (ref 0.6–3.4)
MCH, POC: 31.1 pg (ref 27–31.2)
MCHC: 33.5 g/dL (ref 31.8–35.4)
MCV: 92.6 fL (ref 80–97)
MPV: 6.9 fL (ref 0–99.8)
POC Granulocyte: 5.5 (ref 2–6.9)
POC LYMPH PERCENT: 30.5 %L (ref 10–50)
Platelet Count, POC: 289 10*3/uL (ref 142–424)
RBC: 4.6 M/uL (ref 4.04–5.48)
RDW, POC: 13.6 %
WBC: 8.2 10*3/uL (ref 4.6–10.2)

## 2014-05-28 MED ORDER — TRAMADOL HCL 50 MG PO TABS: ORAL_TABLET | ORAL | Status: DC

## 2014-05-28 MED ORDER — IPRATROPIUM-ALBUTEROL 18-103 MCG/ACT IN AERO: 2.0000 | INHALATION_SPRAY | Freq: Four times a day (QID) | RESPIRATORY_TRACT | Status: DC | PRN

## 2014-05-28 MED ORDER — METHOCARBAMOL 500 MG PO TABS
500.0000 mg | ORAL_TABLET | Freq: Two times a day (BID) | ORAL | Status: AC
Start: 2014-05-28 — End: ?

## 2014-05-28 MED ORDER — CITALOPRAM HYDROBROMIDE 40 MG PO TABS: 40.0000 mg | ORAL_TABLET | Freq: Every day | ORAL | Status: DC

## 2014-05-28 MED ORDER — AMLODIPINE BESYLATE 10 MG PO TABS: 5.0000 mg | ORAL_TABLET | Freq: Every day | ORAL | Status: DC

## 2014-05-28 NOTE — Progress Notes (Signed)
   Subjective:    Patient ID: Brenda Duran, female    DOB: 12-06-1960, 53 y.o.   MRN: [id number]  HPI This 53 y.o. female presents for evaluation of multiple medical problems.  She has chronic pain due to arthritis and lumbar DDD.  She has seen a neurosurgeon in [place] and was advised to have surgery but she declined according to patient.  She has been having problems with hammer toes on the left foot and she has hx of having hammer toes on the right foot which were surgically corrected by Dr. [facility].  She has been having problems with pain in her lower extremities when she walks.  She has hx of depression and reports celexa has worked well.  She needs refills on all her medicines. She has been having some problems with SOB and is still smoking.  She needs refill on combivent MDI.  She has been having some right hand discomfort.   Review of Systems C/o back pain, right hand pain, arthritis, and left foot hammer toes.   No chest pain, SOB, HA, dizziness, vision change, N/V, diarrhea, constipation, dysuria, urinary urgency or frequency, myalgias, arthralgias or rash.  Objective:   Physical Exam  Vital signs noted  Well developed well nourished female.  HEENT - Head atraumatic Normocephalic Respiratory - Lungs CTA bilateral Cardiac - RRR S1 and S2 without murmur GI - Abdomen soft Nontender and bowel sounds active x 4 Extremities - No edema. Neuro - Grossly intact. MS - Left toes with hammer toes  Xray right hand - No fracture Prelimnary reading by Iverson Alamin     Assessment & Plan:  Right hand pain - Plan: DG Hand Complete Right  DDD (degenerative disc disease), lumbar - Plan: traMADol (ULTRAM) 50 MG tablet, Ambulatory referral to Pain Clinic  Acquired hammer toes of both feet - Plan: traMADol (ULTRAM) 50 MG tablet, Ambulatory referral to Podiatry  Mood disorder - Plan: citalopram (CELEXA) 40 MG tablet  Essential hypertension - Plan: amLODipine (NORVASC) 10 MG  tablet  Mucopurulent chronic bronchitis - Plan: albuterol-ipratropium (COMBIVENT) 18-103 MCG/ACT inhaler  Osteopenia - Plan: Vit D  25 hydroxy (rtn osteoporosis monitoring), DG Bone Density  Essential hypertension, benign - Plan: amLODipine (NORVASC) 10 MG tablet, POCT CBC, CMP14+EGFR, Thyroid Panel With TSH  Depression - Plan: citalopram (CELEXA) 40 MG tablet  Arthritis - Plan: methocarbamol (ROBAXIN) 500 MG tablet  Other malaise and fatigue - Plan: Vitamin B12, Vit D  25 hydroxy (rtn osteoporosis monitoring)  Spent over 45 minutes in visit  Follow up in 3 months  Lysbeth Penner FNP

## 2014-05-29 LAB — CMP14+EGFR
ALT: 15 IU/L (ref 0–32)
AST: 22 IU/L (ref 0–40)
Albumin/Globulin Ratio: 1.9 (ref 1.1–2.5)
Albumin: 4.4 g/dL (ref 3.5–5.5)
Alkaline Phosphatase: 84 IU/L (ref 39–117)
BUN/Creatinine Ratio: 29 — ABNORMAL HIGH (ref 9–23)
BUN: 20 mg/dL (ref 6–24)
CO2: 25 mmol/L (ref 18–29)
Calcium: 9.9 mg/dL (ref 8.7–10.2)
Chloride: 102 mmol/L (ref 97–108)
Creatinine, Ser: 0.68 mg/dL (ref 0.57–1.00)
GFR calc Af Amer: 117 mL/min/1.73
GFR calc non Af Amer: 102 mL/min/1.73
Globulin, Total: 2.3 g/dL (ref 1.5–4.5)
Glucose: 89 mg/dL (ref 65–99)
Potassium: 5.3 mmol/L — ABNORMAL HIGH (ref 3.5–5.2)
Sodium: 145 mmol/L — ABNORMAL HIGH (ref 134–144)
Total Bilirubin: <0.2 mg/dL (ref 0.0–1.2)
Total Protein: 6.7 g/dL (ref 6.0–8.5)

## 2014-05-29 LAB — THYROID PANEL WITH TSH
Free Thyroxine Index: 1.6 (ref 1.2–4.9)
T3 Uptake Ratio: 31 % (ref 24–39)
T4, Total: 5.1 ug/dL (ref 4.5–12.0)
TSH: 0.59 u[IU]/mL (ref 0.450–4.500)

## 2014-05-29 LAB — VITAMIN D 25 HYDROXY (VIT D DEFICIENCY, FRACTURES): Vit D, 25-Hydroxy: 40.7 ng/mL (ref 30.0–100.0)

## 2014-05-29 LAB — VITAMIN B12: Vitamin B-12: 408 pg/mL (ref 211–946)

## 2014-05-30 ENCOUNTER — Ambulatory Visit (AMBULATORY_SURGERY_CENTER): Payer: Self-pay | Admitting: *Deleted

## 2014-05-30 VITALS — Ht 61.0 in | Wt 134.6 lb

## 2014-05-30 DIAGNOSIS — Z1211 Encounter for screening for malignant neoplasm of colon: Secondary | ICD-10-CM

## 2014-05-30 MED ORDER — MOVIPREP 100 G PO SOLR: 1.0000 | Freq: Once | ORAL | Status: DC

## 2014-05-30 NOTE — Progress Notes (Signed)
Denies allergies to eggs or soy products. Denies complications with sedation or anesthesia. Denies O2 use. Denies use of diet or weight loss medications.  Emmi instructions given for colonoscopy.  

## 2014-05-31 ENCOUNTER — Telehealth: Payer: Self-pay | Admitting: Family Medicine

## 2014-05-31 NOTE — Telephone Encounter (Signed)
Message copied by Waverly Ferrari on Fri May 31, 2014 10:02 AM ------      Message from: Lysbeth Penner      Created: Thu May 30, 2014  4:23 PM       K is elevated and repeat K ------

## 2014-06-03 ENCOUNTER — Telehealth: Payer: Self-pay | Admitting: Family Medicine

## 2014-06-03 NOTE — Telephone Encounter (Signed)
She wanted to know what a dexa is. I told her.

## 2014-06-13 ENCOUNTER — Other Ambulatory Visit: Payer: Self-pay | Admitting: Internal Medicine

## 2014-06-13 ENCOUNTER — Ambulatory Visit (AMBULATORY_SURGERY_CENTER): Payer: Medicaid Other | Admitting: Internal Medicine

## 2014-06-13 ENCOUNTER — Encounter: Payer: Self-pay | Admitting: Internal Medicine

## 2014-06-13 VITALS — BP 127/95 | HR 75 | Temp 99.0°F | Resp 21 | Ht 61.0 in | Wt 134.0 lb

## 2014-06-13 DIAGNOSIS — D126 Benign neoplasm of colon, unspecified: Secondary | ICD-10-CM

## 2014-06-13 DIAGNOSIS — Z1211 Encounter for screening for malignant neoplasm of colon: Secondary | ICD-10-CM

## 2014-06-13 MED ORDER — SODIUM CHLORIDE 0.9 % IV SOLN: 500.0000 mL | INTRAVENOUS | Status: DC

## 2014-06-13 NOTE — Progress Notes (Signed)
Pt denies drinking any more than a sip of liquid past 0700 this am although had cup of liquid in hand when came into lobby.

## 2014-06-13 NOTE — Progress Notes (Signed)
Called to room to assist during endoscopic procedure.  Patient ID and intended procedure confirmed with present staff. Received instructions for my participation in the procedure from the performing physician.  

## 2014-06-13 NOTE — Progress Notes (Signed)
Pt. Kept for extended time in recovery awaiting transportation.

## 2014-06-13 NOTE — Op Note (Signed)
[facility]  [address] [place], [address]   COLONOSCOPY PROCEDURE REPORT  PATIENT: Brenda Duran, Brenda Duran  MR#: [id number] BIRTHDATE: 1961-10-01 , 52  yrs. old GENDER: Female ENDOSCOPIST: Eustace Quail, MD REFERRED [id number] Laurance Flatten, M.D. PROCEDURE DATE:  06/13/2014 PROCEDURE:   Colonoscopy with snare polypectomy x 1 First Screening Colonoscopy - Avg.  risk and is 50 yrs.  old or older Yes.  Prior Negative Screening - Now for repeat screening. N/A  History of Adenoma - Now for follow-up colonoscopy & has been > or = to 3 yrs.  N/A  Polyps Removed Today? Yes. ASA CLASS:   Class III INDICATIONS:average risk screening. MEDICATIONS: MAC sedation, administered by CRNA and propofol (Diprivan) 230mg  IV DESCRIPTION OF PROCEDURE:   After the risks benefits and alternatives of the procedure were thoroughly explained, informed consent was obtained.  A digital rectal exam revealed no abnormalities of the rectum.   The LB [id number]  endoscope was introduced through the anus and advanced to the cecum, which was identified by both the appendix and ileocecal valve. No adverse events experienced.   The quality of the prep was excellent, using MoviPrep  The instrument was then slowly withdrawn as the colon was fully examined.  COLON FINDINGS: A single polyp measuring 5 mm in size was found in the rectum.  A polypectomy was performed with a cold snare.  The resection was complete and the polyp tissue was completely retrieved.   Mild diverticulosis was noted in the left colon.   A lipoma was found in the transverse (18mm) and descending (39mm) colon.   The colon mucosa was otherwise normal.  Retroflexed views revealed no abnormalities. The time to cecum=3 minutes 26 seconds. Withdrawal time=10 minutes 57 seconds.  The scope was withdrawn and the procedure completed. COMPLICATIONS: There were no complications.  ENDOSCOPIC IMPRESSION: 1.   Single polyp measuring 5 mm in size  was found in the rectum; polypectomy was performed with a cold snare 2.   Mild diverticulosis was noted in the left colon 3.   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   The colon mucosa was otherwise normal  RECOMMENDATIONS: 1. Repeat colonoscopy in 5 years if polyp adenomatous; otherwise 10 years   eSigned:  Eustace Quail, MD 06/13/2014 10:35 AM   cc: Redge Gainer, MD Stevan Born, [place]) and The Patient

## 2014-06-13 NOTE — Progress Notes (Signed)
Brenda Duran aware that pt. Came in siping on water and she had consumed aprox. 1/4 of 20 oz of fluids and he said o.k. Continue with procedure as scheduled.

## 2014-06-13 NOTE — Progress Notes (Signed)
Report to PACU, RN, vss, BBS= Clear.  

## 2014-06-13 NOTE — Patient Instructions (Signed)
FOLLOW DISCHARGE INSTRUCTIONS (BLUE AND GREEN SHEETS).YOU HAD AN ENDOSCOPIC PROCEDURE TODAY AT [facility]: Refer to the procedure report that was given to you for any specific questions about what was found during the examination.  If the procedure report does not answer your questions, please call your gastroenterologist to clarify.  If you requested that your care partner not be given the details of your procedure findings, then the procedure report has been included in a sealed envelope for you to review at your convenience later.  YOU SHOULD EXPECT: Some feelings of bloating in the abdomen. Passage of more gas than usual.  Walking can help get rid of the air that was put into your GI tract during the procedure and reduce the bloating. If you had a lower endoscopy (such as a colonoscopy or flexible sigmoidoscopy) you may notice spotting of blood in your stool or on the toilet paper. If you underwent a bowel prep for your procedure, then you may not have a normal bowel movement for a few days.  DIET: Your first meal following the procedure should be a light meal and then it is ok to progress to your normal diet.  A half-sandwich or bowl of soup is an example of a good first meal.  Heavy or fried foods are harder to digest and may make you feel nauseous or bloated.  Likewise meals heavy in dairy and vegetables can cause extra gas to form and this can also increase the bloating.  Drink plenty of fluids but you should avoid alcoholic beverages for 24 hours.  ACTIVITY: Your care partner should take you home directly after the procedure.  You should plan to take it easy, moving slowly for the rest of the day.  You can resume normal activity the day after the procedure however you should NOT DRIVE or use heavy machinery for 24 hours (because of the sedation medicines used during the test).    SYMPTOMS TO REPORT IMMEDIATELY: A gastroenterologist can be reached at any hour.  During normal  business hours, 8:30 AM to 5:00 PM Monday through Friday, call [phone].  After hours and on weekends, please call the GI answering service at [phone] who will take a message and have the physician on call contact you.   Following lower endoscopy (colonoscopy or flexible sigmoidoscopy):  Excessive amounts of blood in the stool  Significant tenderness or worsening of abdominal pains  Swelling of the abdomen that is new, acute  Fever of 100F or higher    FOLLOW UP: If any biopsies were taken you will be contacted by phone or by letter within the next 1-3 weeks.  Call your gastroenterologist if you have not heard about the biopsies in 3 weeks.  Our staff will call the home number listed on your records the next business day following your procedure to check on you and address any questions or concerns that you may have at that time regarding the information given to you following your procedure. This is a courtesy call and so if there is no answer at the home number and we have not heard from you through the emergency physician on call, we will assume that you have returned to your regular daily activities without incident.  SIGNATURES/CONFIDENTIALITY: You and/or your care partner have signed paperwork which will be entered into your electronic medical record.  These signatures attest to the fact that that the information above on your After Visit Summary has been reviewed and is understood.  Full responsibility of the confidentiality of this discharge information lies with you and/or your care-partner.  Polyp and high fiber diet information given. Diverticulosis Diverticulosis is the condition that develops when small pouches (diverticula) form in the wall of your colon. Your colon, or large intestine, is where water is absorbed and stool is formed. The pouches form when the inside layer of your colon pushes through weak spots in the outer layers of your colon. CAUSES  No one knows  exactly what causes diverticulosis. RISK FACTORS  Being older than 39. Your risk for this condition increases with age. Diverticulosis is rare in people younger than 40 years. By age 27, almost everyone has it.  Eating a low-fiber diet.  Being frequently constipated.  Being overweight.  Not getting enough exercise.  Smoking.  Taking over-the-counter pain medicines, like aspirin and ibuprofen. SYMPTOMS  Most people with diverticulosis do not have symptoms. DIAGNOSIS  Because diverticulosis often has no symptoms, health care providers often discover the condition during an exam for other colon problems. In many cases, a health care provider will diagnose diverticulosis while using a flexible scope to examine the colon (colonoscopy). TREATMENT  If you have never developed an infection related to diverticulosis, you may not need treatment. If you have had an infection before, treatment may include:  Eating more fruits, vegetables, and grains.  Taking a fiber supplement.  Taking a live bacteria supplement (probiotic).  Taking medicine to relax your colon. HOME CARE INSTRUCTIONS   Drink at least 6-8 glasses of water each day to prevent constipation.  Try not to strain when you have a bowel movement.  Keep all follow-up appointments. If you have had an infection before:  Increase the fiber in your diet as directed by your health care provider or dietitian.  Take a dietary fiber supplement if your health care provider approves.  Only take medicines as directed by your health care provider. SEEK MEDICAL CARE IF:   You have abdominal pain.  You have bloating.  You have cramps.  You have not gone to the bathroom in 3 days. SEEK IMMEDIATE MEDICAL CARE IF:   Your pain gets worse.  Yourbloating becomes very bad.  You have a fever or chills, and your symptoms suddenly get worse.  You begin vomiting.  You have bowel movements that are bloody or black. MAKE SURE  YOU:  Understand these instructions.  Will watch your condition.  Will get help right away if you are not doing well or get worse. Document Released: 07/01/2004 Document Revised: 10/09/2013 Document Reviewed: 08/29/2013 [facility] Patient Information 2015 [place], [place]. This information is not intended to replace advice given to you by your health care provider. Make sure you discuss any questions you have with your health care provider.

## 2014-06-14 ENCOUNTER — Telehealth: Payer: Self-pay | Admitting: *Deleted

## 2014-06-14 NOTE — Telephone Encounter (Signed)
  Follow up Call-  Call back number 06/13/2014  Post procedure Call Back phone  # [phone]  Permission to leave phone message Yes     Patient questions:  Do you have a fever, pain , or abdominal swelling? No. Pain Score  0 *  Have you tolerated food without any problems? Yes.    Have you been able to return to your normal activities? Yes.    Do you have any questions about your discharge instructions: Diet   No. Medications  No. Follow up visit  No.  Do you have questions or concerns about your Care? No.  Actions: * If pain score is 4 or above: No action needed, pain <4.

## 2014-06-17 ENCOUNTER — Encounter: Payer: Self-pay | Admitting: Family Medicine

## 2014-06-17 ENCOUNTER — Ambulatory Visit (INDEPENDENT_AMBULATORY_CARE_PROVIDER_SITE_OTHER): Payer: Medicaid Other | Admitting: Family Medicine

## 2014-06-17 ENCOUNTER — Ambulatory Visit (INDEPENDENT_AMBULATORY_CARE_PROVIDER_SITE_OTHER): Payer: Medicaid Other

## 2014-06-17 VITALS — BP 91/67 | HR 91 | Temp 98.2°F | Ht 61.0 in | Wt 138.0 lb

## 2014-06-17 DIAGNOSIS — T148XXA Other injury of unspecified body region, initial encounter: Secondary | ICD-10-CM

## 2014-06-17 DIAGNOSIS — R071 Chest pain on breathing: Secondary | ICD-10-CM

## 2014-06-17 MED ORDER — KETOROLAC TROMETHAMINE 30 MG/ML IJ SOLN
30.0000 mg | Freq: Once | INTRAMUSCULAR | Status: AC
Start: 2014-06-17 — End: 2014-06-17
  Administered 2014-06-17: 30 mg via INTRAMUSCULAR
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
## 2014-06-18 ENCOUNTER — Ambulatory Visit: Payer: Medicaid Other | Admitting: Podiatry

## 2014-06-19 ENCOUNTER — Encounter: Payer: Self-pay | Admitting: Internal Medicine

## 2014-06-25 ENCOUNTER — Other Ambulatory Visit: Payer: Self-pay | Admitting: Family Medicine

## 2014-06-26 ENCOUNTER — Other Ambulatory Visit: Payer: Self-pay | Admitting: Family Medicine

## 2014-06-27 ENCOUNTER — Encounter: Payer: Self-pay | Admitting: Podiatry

## 2014-06-27 ENCOUNTER — Ambulatory Visit (INDEPENDENT_AMBULATORY_CARE_PROVIDER_SITE_OTHER): Payer: Medicaid Other | Admitting: Podiatry

## 2014-06-27 ENCOUNTER — Ambulatory Visit (INDEPENDENT_AMBULATORY_CARE_PROVIDER_SITE_OTHER): Payer: Medicaid Other

## 2014-06-27 VITALS — BP 107/71 | HR 74 | Resp 18 | Ht 62.0 in | Wt 138.0 lb

## 2014-06-27 DIAGNOSIS — M79676 Pain in unspecified toe(s): Secondary | ICD-10-CM

## 2014-06-27 DIAGNOSIS — G5760 Lesion of plantar nerve, unspecified lower limb: Secondary | ICD-10-CM

## 2014-06-27 DIAGNOSIS — B351 Tinea unguium: Secondary | ICD-10-CM

## 2014-06-27 DIAGNOSIS — M2042 Other hammer toe(s) (acquired), left foot: Secondary | ICD-10-CM

## 2014-06-27 DIAGNOSIS — M2040 Other hammer toe(s) (acquired), unspecified foot: Secondary | ICD-10-CM

## 2014-06-27 DIAGNOSIS — G5782 Other specified mononeuropathies of left lower limb: Secondary | ICD-10-CM

## 2014-06-27 DIAGNOSIS — M79609 Pain in unspecified limb: Secondary | ICD-10-CM
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
## 2014-06-27 NOTE — Telephone Encounter (Signed)
Patient last seen in office on 06-17-14. Last refill was on 05-28-14 for #90. Please advise on refill. If approved please route to Pool A so nurse can phone pt to come and pick up. Rx will print

## 2014-07-02 ENCOUNTER — Telehealth: Payer: Self-pay | Admitting: *Deleted

## 2014-07-02 NOTE — Telephone Encounter (Signed)
Pt notified RX for Tramadol ready for pick up

## 2014-07-25 ENCOUNTER — Telehealth: Payer: Self-pay | Admitting: Family Medicine

## 2014-07-25 MED ORDER — TRAMADOL HCL 50 MG PO TABS: ORAL_TABLET | ORAL | Status: DC

## 2014-07-25 NOTE — Telephone Encounter (Signed)
rx ready for pick up  Cannot fill till 07/31/14

## 2014-07-25 NOTE — Telephone Encounter (Signed)
Aware ,script ready. 

## 2014-08-12 ENCOUNTER — Telehealth: Payer: Self-pay | Admitting: Family Medicine

## 2014-08-12 NOTE — Telephone Encounter (Signed)
Tramadol script given to be filled on 07-31-14. Should not be due for refill.  Please keep your  Appointments.

## 2014-08-12 NOTE — Telephone Encounter (Signed)
Please advise. Looks like rx was given this month and was not to fill till 07-31-14.

## 2014-08-12 NOTE — Telephone Encounter (Signed)
Patient was given rx to be filled on 07/31/14 and that should last her a month- should not need rx

## 2014-08-13 ENCOUNTER — Other Ambulatory Visit: Payer: Self-pay | Admitting: Nurse Practitioner

## 2014-08-14 ENCOUNTER — Encounter: Payer: Self-pay | Admitting: Pharmacist

## 2014-08-14 ENCOUNTER — Ambulatory Visit (INDEPENDENT_AMBULATORY_CARE_PROVIDER_SITE_OTHER): Payer: Medicaid Other | Admitting: Pharmacist

## 2014-08-14 ENCOUNTER — Ambulatory Visit (INDEPENDENT_AMBULATORY_CARE_PROVIDER_SITE_OTHER): Payer: Medicaid Other

## 2014-08-14 VITALS — BP 110/68 | HR 72 | Ht 60.0 in | Wt 141.0 lb

## 2014-08-14 DIAGNOSIS — M8580 Other specified disorders of bone density and structure, unspecified site: Secondary | ICD-10-CM

## 2014-08-14 DIAGNOSIS — Z79899 Other long term (current) drug therapy: Secondary | ICD-10-CM

## 2014-08-14 MED ORDER — ALENDRONATE SODIUM 70 MG PO TABS: 70.0000 mg | ORAL_TABLET | ORAL | Status: DC

## 2014-08-14 MED ORDER — TRAMADOL HCL 50 MG PO TABS: ORAL_TABLET | ORAL | Status: DC

## 2014-08-14 NOTE — Patient Instructions (Signed)
Stop Diclofenac - naproxen  Replaces diclofenac.  Start alendronate 57m take 1 tablet once a week - take on an empty stomach with a full glass of water.  Do not eat, drink or lie down for 30 minutes after taking alendronate.  Take amlodipine 161m1/2 tablet daily - important to take every day.                Exercise for Strong Bones  Exercise is important to build and maintain strong bones / bone density.  There are 2 types of exercises that are important to building and maintaining strong bones:  Weight- bearing and muscle-stregthening.  Weight-bearing Exercises  These exercises include activities that make you move against gravity while staying upright. Weight-bearing exercises can be high-impact or low-impact.  High-impact weight-bearing exercises help build bones and keep them strong. If you have broken a bone due to osteoporosis or are at risk of breaking a bone, you may need to avoid high-impact exercises. If you're not sure, you should check with your healthcare provider.  Examples of high-impact weight-bearing exercises are: Dancing  Doing high-impact aerobics  Hiking  Jogging/running  Jumping Rope  Stair climbing  Tennis  Low-impact weight-bearing exercises can also help keep bones strong and are a safe alternative if you cannot do high-impact exercises.   Examples of low-impact weight-bearing exercises are: Using elliptical training machines  Doing low-impact aerobics  Using stair-step machines  Fast walking on a treadmill or outside   Muscle-Strengthening Exercises These exercises include activities where you move your body, a weight or some other resistance against gravity. They are also known as resistance exercises and include: Lifting weights  Using elastic exercise bands  Using weight machines  Lifting your own body weight  Functional movements, such as standing and rising up on your toes  Yoga and Pilates can also improve strength, balance and  flexibility. However, certain positions may not be safe for people with osteoporosis or those at increased risk of broken bones. For example, exercises that have you bend forward may increase the chance of breaking a bone in the spine.   Non-Impact Exercises There are other types of exercises that can help prevent falls.  Non-impact exercises can help you to improve balance, posture and how well you move in everyday activities. Some of these exercises include: Balance exercises that strengthen your legs and test your balance, such as Tai Chi, can decrease your risk of falls.  Posture exercises that improve your posture and reduce rounded or "sloping" shoulders can help you decrease the chance of breaking a bone, especially in the spine.  Functional exercises that improve how well you move can help you with everyday activities and decrease your chance of falling and breaking a bone. For example, if you have trouble getting up from a chair or climbing stairs, you should do these activities as exercises.   **A physical therapist can teach you balance, posture and functional exercises. He/she can also help you learn which exercises are safe and appropriate for you.  [facility] has a physical therapy office [place] front of our office and referrals can be made for assessments and treatment as needed and strength and balance training.  If you would like to have an assessment with [facility] our physical therapy team please let a nurse or provider know.   Fall Prevention and Home Safety Falls cause injuries and can affect all age groups. It is possible to use preventive measures to significantly decrease the  likelihood of falls. There are many simple measures which can make your home safer and prevent falls. OUTDOORS  Repair cracks and edges of walkways and driveways.  Remove high doorway thresholds.  Trim shrubbery on the main path into your home.  Have good outside lighting.  Clear walkways  of tools, rocks, debris, and clutter.  Check that handrails are not broken and are securely fastened. Both sides of steps should have handrails.  Have leaves, snow, and ice cleared regularly.  Use sand or salt on walkways during winter months.  In the garage, clean up grease or oil spills. BATHROOM  Install night lights.  Install grab bars by the toilet and in the tub and shower.  Use non-skid mats or decals in the tub or shower.  Place a plastic non-slip stool in the shower to sit on, if needed.  Keep floors dry and clean up all water on the floor immediately.  Remove soap buildup in the tub or shower on a regular basis.  Secure bath mats with non-slip, double-sided rug tape.  Remove throw rugs and tripping hazards from the floors. BEDROOMS  Install night lights.  Make sure a bedside light is easy to reach.  Do not use oversized bedding.  Keep a telephone by your bedside.  Have a firm chair with side arms to use for getting dressed.  Remove throw rugs and tripping hazards from the floor. KITCHEN  Keep handles on pots and pans turned toward the center of the stove. Use back burners when possible.  Clean up spills quickly and allow time for drying.  Avoid walking on wet floors.  Avoid hot utensils and knives.  Position shelves so they are not too high or low.  Place commonly used objects within easy reach.  If necessary, use a sturdy step stool with a grab bar when reaching.  Keep electrical cables out of the way.  Do not use floor polish or wax that makes floors slippery. If you must use wax, use non-skid floor wax.  Remove throw rugs and tripping hazards from the floor. STAIRWAYS  Never leave objects on stairs.  Place handrails on both sides of stairways and use them. Fix any loose handrails. Make sure handrails on both sides of the stairways are as long as the stairs.  Check carpeting to make sure it is firmly attached along stairs. Make repairs to  worn or loose carpet promptly.  Avoid placing throw rugs at the top or bottom of stairways, or properly secure the rug with carpet tape to prevent slippage. Get rid of throw rugs, if possible.  Have an electrician put in a light switch at the top and bottom of the stairs. OTHER FALL PREVENTION TIPS  Wear low-heel or rubber-soled shoes that are supportive and fit well. Wear closed toe shoes.  When using a stepladder, make sure it is fully opened and both spreaders are firmly locked. Do not climb a closed stepladder.  Add color or contrast paint or tape to grab bars and handrails in your home. Place contrasting color strips on first and last steps.  Learn and use mobility aids as needed. Install an electrical emergency response system.  Turn on lights to avoid dark areas. Replace light bulbs that burn out immediately. Get light switches that glow.  Arrange furniture to create clear pathways. Keep furniture in the same place.  Firmly attach carpet with non-skid or double-sided tape.  Eliminate uneven floor surfaces.  Select a carpet pattern that does not visually hide  the edge of steps.  Be aware of all pets. OTHER HOME SAFETY TIPS  Set the water temperature for 120 F (48.8 C).  Keep emergency numbers on or near the telephone.  Keep smoke detectors on every level of the home and near sleeping areas. Document Released: 09/24/2002 Document Revised: 04/04/2012 Document Reviewed: 12/24/2011 [facility] Patient Information 2015 [place], [place]. This information is not intended to replace advice given to you by your health care provider. Make sure you discuss any questions you have with your health care provider.

## 2014-08-14 NOTE — Progress Notes (Signed)
Patient ID: AYLINE DINGUS, female   DOB: February 10, 1961, 53 y.o.   MRN: [id number] HPI: Patient comes in to have DEXA reviewed and medication recommended.  She also requests refills on tramadol.  She has been referred to pain clinic and had appt from 08/13/14 but the pain clinic rescheduled for 08/26/14. She does not have enough tramadol to last until that appt.  I also reviewed her medications and determined some discrepancies between how patient was taking medications and the directions.  Verified directions with her pharmacy.  In particular patient states that she was taking amlodipine 10mg  1 tablet daily although medical record was 1/2 tablet daily.  Pharmacy also had 1/2 tablet daily and report that she had #30 tablets (=60 days supply) filled 05/28/2014 and then most recently 07/18/2014.  Tramadol - filled #90 07/03/2014 (sig is take 1-2 tablets every 6 hours as needed) and last had #60 filled 07/26/2014)  Current Outpatient Prescriptions on File Prior to Visit  Medication Sig Dispense Refill  . albuterol (PROVENTIL) (2.5 MG/3ML) 0.083% nebulizer solution NEBULIZE 1 VIAL EVERY 6 HOURS AS NEEDED  450 mL  2   dicofenac 75mg   Take  1 table tbid 60   . albuterol-ipratropium (COMBIVENT) 18-103 MCG/ACT inhaler Inhale 2 puffs into the lungs every 6 (six) hours as needed. wheezing  14.7 g  6  . amLODipine (NORVASC) 10 MG tablet Take 0.5 tablets (5 mg total) by mouth daily.  30 tablet  6  . citalopram (CELEXA) 40 MG tablet Take 1 tablet (40 mg total) by mouth daily.  30 tablet  6  . ipratropium (ATROVENT) 0.02 % nebulizer solution NEBULIZE 1 VIAL 4 TIMES A DAY  312.5 mL  2  . methocarbamol (ROBAXIN) 500 MG tablet Take 1-2 tablets (500-1,000 mg total) by mouth 2 (two) times daily.  60 tablet  5  . naproxen (NAPROSYN) 500 MG tablet TAKE  (1)  TABLET TWICE A DAY WITH MEALS (BREAKFAST AND SUPPER)  30 tablet  3  . omeprazole (PRILOSEC) 20 MG capsule Take 1 capsule (20 mg total) by mouth daily.  30 capsule  6  .  traZODone (DESYREL) 100 MG tablet Take 200 mg by mouth at bedtime.       Marland Kitchen HYDROcodone-acetaminophen (NORCO/VICODIN) 5-325 MG per tablet Take 1 tablet by mouth every 6 (six) hours as needed for moderate pain.       No current facility-administered medications on file prior to visit.    Osteoporosis Clinic Current Height: Height: 5' (152.4 cm)      Max Lifetime Height:  5\' 2"  Current Weight: Weight: 141 lb (63.957 kg)       Ethnicity:Caucasian  BP: BP: 110/68 mmHg     HR:  Pulse Rate: 72      HPI: Does pt already have a diagnosis of:  Osteopenia?  No Osteoporosis?  No  Back Pain?  Yes       Kyphosis?  No Prior fracture?  Yes  - left hand, left arm and elbow, right arm Med(s) for Osteoporosis/Osteopenia:  none Med(s) previously tried for Osteoporosis/Osteopenia:  none                                                             PMH: Age at menopause:  38's Hysterectomy?  No Oophorectomy?  No HRT? No Steroid Use?  No Thyroid med?  No History of cancer?  No History of digestive disorders (ie Crohn's)?  Yes - GERD on chronic PPI Current or previous eating disorders?  No Last Vitamin D Result:  40.7 (05/28/2014) Last GFR Result:  102 (05/28/2014)   FH/SH: Family history of osteoporosis?  No Parent with history of hip fracture?  No Family history of breast cancer?  No Exercise?  No Smoking?  Yes - not ready to quit Alcohol?  Yes - not daily    Calcium Assessment Calcium Intake  # of servings/day  Calcium mg  Milk (8 oz) 1 - 2  x  300  = 300mg  to 600mg   Yogurt (4 oz) 0 x  200 = 0  Cheese (1 oz) 1 x  200 = 200mg   Other Calcium sources   250mg   Ca supplement 0 = 0   Estimated calcium intake per day 750mg  to 1050mg     DEXA Results Date of Test T-Score for AP Spine L1-L4 T-Score for Total Left Hip T-Score for Total Right Hip  08/13/2014 -2.2 -2.1 -2.2                  FRAX 10 year estimate: Total FX risk:  17%  (consider medication if >/= 20%) Hip FX risk:  3.4%   (consider medication if >/= 3%)  Assessment: Osteopenia with high fracture risk Medication Management and Education Tobacco Abuse  Recommendations: 1.  Start  alendronate (FOSAMAX) 70mg  1 tablet weekly - administration instructions reviewed w/ patient.  Take on an  Empty stomach with a full glass of water.  Nothing to eat or drink other than water and no reclining for 30 minutes after adminsitration 2.  recommend calcium 1200mg  daily through supplementation or diet.  3.  recommend weight bearing exercise - 30 minutes at least 4 days per week.   4.  Counseled and educated about fall risk and prevention. 5.  Instructed patient to take amlodipine according to current directions - 1/2 tablet of 10mg  once dail . 6.  Discontinue diclofenac since now taking naproxen.  7.  Rx called in with approval from PCP [facility] for #45 tramadol 50mg  1-2 tablets every 6 hours as needed.  8.  Discussed adverse effects on bones and other organ systems of tobacco use.  Patient is not ready to quit yet.     Recheck DEXA:  2 years  Time spent counseling patient:  40 minutes  Cherre Robins, PharmD, CPP

## 2014-08-28 ENCOUNTER — Other Ambulatory Visit: Payer: Self-pay | Admitting: Family Medicine

## 2014-08-29 ENCOUNTER — Encounter: Payer: Self-pay | Admitting: Podiatry

## 2014-08-29 ENCOUNTER — Ambulatory Visit (INDEPENDENT_AMBULATORY_CARE_PROVIDER_SITE_OTHER): Payer: Medicaid Other | Admitting: Podiatry

## 2014-08-29 VITALS — BP 121/81 | HR 74 | Resp 17

## 2014-08-29 DIAGNOSIS — M2042 Other hammer toe(s) (acquired), left foot: Secondary | ICD-10-CM

## 2014-08-29 NOTE — Patient Instructions (Signed)
Pre-Operative Instructions  Congratulations, you have decided to take an important step to improving your quality of life.  You can be assured that the doctors of [facility] will be with you every step of the way.  1. Plan to be at the surgery center/hospital at least 1 (one) hour prior to your scheduled time unless otherwise directed by the surgical center/hospital staff.  You must have a responsible adult accompany you, remain during the surgery and drive you home.  Make sure you have directions to the surgical center/hospital and know how to get there on time. 2. For hospital based surgery you will need to obtain a history and physical form from your family physician within 1 month prior to the date of surgery- we will give you a form for you primary physician.  3. We make every effort to accommodate the date you request for surgery.  There are however, times where surgery dates or times have to be moved.  We will contact you as soon as possible if a change in schedule is required.   4. No Aspirin/Ibuprofen for one week before surgery.  If you are on aspirin, any non-steroidal anti-inflammatory medications (Mobic, Aleve, Ibuprofen) you should stop taking it 7 days prior to your surgery.  You make take Tylenol  For pain prior to surgery.  5. Medications- If you are taking daily heart and blood pressure medications, seizure, reflux, allergy, asthma, anxiety, pain or diabetes medications, make sure the surgery center/hospital is aware before the day of surgery so they may notify you which medications to take or avoid the day of surgery. 6. No food or drink after midnight the night before surgery unless directed otherwise by surgical center/hospital staff. 7. No alcoholic beverages 24 hours prior to surgery.  No smoking 24 hours prior to or 24 hours after surgery. 8. Wear loose pants or shorts- loose enough to fit over bandages, boots, and casts. 9. No slip on shoes, sneakers are best. 10. Bring  your boot with you to the surgery center/hospital.  Also bring crutches or a walker if your physician has prescribed it for you.  If you do not have this equipment, it will be provided for you after surgery. 11. If you have not been contracted by the surgery center/hospital by the day before your surgery, call to confirm the date and time of your surgery. 12. Leave-time from work may vary depending on the type of surgery you have.  Appropriate arrangements should be made prior to surgery with your employer. 13. Prescriptions will be provided immediately following surgery by your doctor.  Have these filled as soon as possible after surgery and take the medication as directed. 14. Remove nail polish on the operative foot. 15. Wash the night before surgery.  The night before surgery wash the foot and leg well with the antibacterial soap provided and water paying special attention to beneath the toenails and in between the toes.  Rinse thoroughly with water and dry well with a towel.  Perform this wash unless told not to do so by your physician.  Enclosed: 1 Ice pack (please put in freezer the night before surgery)   1 Hibiclens skin cleaner   Pre-op Instructions  If you have any questions regarding the instructions, do not hesitate to call our office.  [facility]: [address] [place], [place] [address] [phone]  [place]: [address] , [place] [address] [phone]  [place]: [address]  [place], [place] [address] [phone]  Dr. Richard   Tuchman DPM, Dr. Norman Regal DPM Dr. Richard Sikora DPM, Dr. M. Todd Hyatt DPM, Dr. Kathryn [place] DPM 

## 2014-08-30 ENCOUNTER — Ambulatory Visit (INDEPENDENT_AMBULATORY_CARE_PROVIDER_SITE_OTHER): Payer: Medicaid Other | Admitting: *Deleted

## 2014-08-30 ENCOUNTER — Ambulatory Visit: Payer: Medicaid Other

## 2014-08-30 DIAGNOSIS — Z23 Encounter for immunization: Secondary | ICD-10-CM

## 2014-08-30 NOTE — Progress Notes (Signed)
She presents today for follow-up of her neuroma third interspace of the left foot. Stating that it does not hurt it just stays numb.  Objective: Vital signs are stable she is alert and oriented 3. Mild tenderness on palpation.  Assessment: Neuroma hammertoe deformity left.  Plan:follow-up with me as needed.

## 2014-09-06 ENCOUNTER — Ambulatory Visit (INDEPENDENT_AMBULATORY_CARE_PROVIDER_SITE_OTHER): Payer: Medicaid Other | Admitting: Family Medicine

## 2014-09-06 ENCOUNTER — Encounter: Payer: Self-pay | Admitting: Family Medicine

## 2014-09-06 VITALS — BP 130/92 | HR 86 | Temp 99.0°F | Ht 60.0 in | Wt 140.2 lb

## 2014-09-06 DIAGNOSIS — M5136 Other intervertebral disc degeneration, lumbar region: Secondary | ICD-10-CM

## 2014-09-06 NOTE — Progress Notes (Signed)
   Subjective:    Patient ID: Brenda Duran, female    DOB: May 26, 1961, 53 y.o.   MRN: [id number]  HPI Patient is here for follow up.  She has hx of DDD of LS spine and she is requesting referral to neurosurgery to see Dr. Carloyn Manner.  She is seeing pain management.  She states she had appointment last month with Dr. Carloyn Manner and couldn't make it.  Review of Systems  Constitutional: Negative for fever.  HENT: Negative for ear pain.   Eyes: Negative for discharge.  Respiratory: Negative for cough.   Cardiovascular: Negative for chest pain.  Gastrointestinal: Negative for abdominal distention.  Endocrine: Negative for polyuria.  Genitourinary: Negative for difficulty urinating.  Musculoskeletal: Negative for gait problem and neck pain.  Skin: Negative for color change and rash.  Neurological: Negative for speech difficulty and headaches.  Psychiatric/Behavioral: Negative for agitation.       Objective:    BP 130/92 mmHg  Pulse 86  Temp(Src) 99 F (37.2 C) (Oral)  Ht 5' (1.524 m)  Wt 140 lb 3.2 oz (63.594 kg)  BMI 27.38 kg/m2   Physical Exam  Constitutional: She is oriented to person, place, and time. She appears well-developed and well-nourished.  HENT:  Head: Normocephalic and atraumatic.  Mouth/Throat: Oropharynx is clear and moist.  Eyes: Pupils are equal, round, and reactive to light.  Neck: Normal range of motion. Neck supple.  Cardiovascular: Normal rate and regular rhythm.   No murmur heard. Pulmonary/Chest: Effort normal and breath sounds normal.  Abdominal: Soft. Bowel sounds are normal. There is no tenderness.  Neurological: She is alert and oriented to person, place, and time.  Skin: Skin is warm and dry.  Psychiatric: She has a normal mood and affect.          Assessment & Plan:     ICD-9-CM ICD-10-CM   1. DDD (degenerative disc disease), lumbar 722.52 M51.36 Ambulatory referral to Neurosurgery     No Follow-up on file.  Lysbeth Penner FNP

## 2014-09-16 ENCOUNTER — Other Ambulatory Visit: Payer: Self-pay

## 2014-09-16 ENCOUNTER — Other Ambulatory Visit: Payer: Self-pay | Admitting: Family Medicine

## 2014-09-16 DIAGNOSIS — M549 Dorsalgia, unspecified: Secondary | ICD-10-CM

## 2014-09-19 ENCOUNTER — Other Ambulatory Visit: Payer: Self-pay | Admitting: Podiatry

## 2014-09-19 MED ORDER — PROMETHAZINE HCL 25 MG PO TABS: 25.0000 mg | ORAL_TABLET | Freq: Three times a day (TID) | ORAL | Status: DC | PRN

## 2014-09-19 MED ORDER — OXYCODONE-ACETAMINOPHEN 10-325 MG PO TABS: 1.0000 | ORAL_TABLET | Freq: Four times a day (QID) | ORAL | Status: DC | PRN

## 2014-09-19 MED ORDER — CEPHALEXIN 500 MG PO CAPS: 500.0000 mg | ORAL_CAPSULE | Freq: Three times a day (TID) | ORAL | Status: DC

## 2014-09-20 ENCOUNTER — Encounter: Payer: Self-pay | Admitting: Podiatry

## 2014-09-20 ENCOUNTER — Encounter: Payer: Medicaid Other | Admitting: Podiatry

## 2014-09-20 DIAGNOSIS — M2042 Other hammer toe(s) (acquired), left foot: Secondary | ICD-10-CM | POA: Diagnosis not present

## 2014-09-20 DIAGNOSIS — L600 Ingrowing nail: Secondary | ICD-10-CM | POA: Diagnosis not present

## 2014-09-26 ENCOUNTER — Encounter: Payer: Medicaid Other | Admitting: Podiatry

## 2014-09-26 ENCOUNTER — Ambulatory Visit: Payer: Medicaid Other

## 2014-09-30 ENCOUNTER — Other Ambulatory Visit: Payer: Self-pay | Admitting: Family Medicine

## 2014-10-01 ENCOUNTER — Encounter: Payer: Self-pay | Admitting: Podiatry

## 2014-10-01 ENCOUNTER — Ambulatory Visit (INDEPENDENT_AMBULATORY_CARE_PROVIDER_SITE_OTHER): Payer: Medicaid Other | Admitting: Podiatry

## 2014-10-01 ENCOUNTER — Ambulatory Visit (INDEPENDENT_AMBULATORY_CARE_PROVIDER_SITE_OTHER): Payer: Medicaid Other

## 2014-10-01 VITALS — BP 104/64 | HR 89 | Resp 12

## 2014-10-01 DIAGNOSIS — Z9889 Other specified postprocedural states: Secondary | ICD-10-CM

## 2014-10-01 DIAGNOSIS — M2012 Hallux valgus (acquired), left foot: Secondary | ICD-10-CM

## 2014-10-01 DIAGNOSIS — M2042 Other hammer toe(s) (acquired), left foot: Secondary | ICD-10-CM

## 2014-10-01 NOTE — Progress Notes (Signed)
She presents today with a little more than 1 week status post hammertoe repair #4 #5 of the left foot with total matrixectomy's #1 and #5 left foot she states she's doing pretty well. She denies fever chills nausea vomiting muscle aches pains chest pain or shortness of breath.  Objective: Vital signs are stable she's alert and oriented 3 pulses are palpable bilateral. Dry sterile dressing intact was removed demonstrates margins well coapted and sutures are intact hammertoe's. To be healing well radiographs confirm this.  Assessment: Well-healing surgical toes left foot.  Plan: Redress today dressing compressive dressing follow-up with me in 1 week for suture removal.

## 2014-10-08 ENCOUNTER — Ambulatory Visit (INDEPENDENT_AMBULATORY_CARE_PROVIDER_SITE_OTHER): Payer: Medicaid Other | Admitting: Podiatry

## 2014-10-08 VITALS — BP 113/75 | HR 87 | Temp 98.6°F | Resp 16

## 2014-10-08 DIAGNOSIS — Z9889 Other specified postprocedural states: Secondary | ICD-10-CM

## 2014-10-08 MED ORDER — OXYCODONE-ACETAMINOPHEN 10-325 MG PO TABS: 1.0000 | ORAL_TABLET | Freq: Four times a day (QID) | ORAL | Status: DC | PRN

## 2014-10-08 NOTE — Progress Notes (Signed)
She presents today for a little more than 2 weeks status post hammertoe repair #4 #5 of the left foot. As well as matrixectomy of the hallux and fifth toe left. She states that she is doing quite well with this though it is mildly tender.  Objective: Her signs are stable she is alert and oriented 3. Dry sterile dressings was removed demonstrates sutures are well coapted and margins are well coapted. There is no signs of infection.  Assessment: Well-healing surgical toes left. All sutures were removed today margins remain well coapted. Toes appear to be healing well.  Plan: Removed all sutures today will follow up with her in 2 weeks I will allow her to start getting this wet she is to notify me should she visualize any signs of infection.

## 2014-10-09 NOTE — Progress Notes (Signed)
Dr Milinda Pointer performed a hammertoe repair left 4,5 met. Total nail matrixectomy 5th left met and partial nail hallux left on 09/20/14

## 2014-10-22 ENCOUNTER — Ambulatory Visit: Payer: Medicaid Other | Admitting: Podiatry

## 2014-10-29 ENCOUNTER — Ambulatory Visit (INDEPENDENT_AMBULATORY_CARE_PROVIDER_SITE_OTHER): Payer: Medicaid Other | Admitting: Podiatry

## 2014-10-29 ENCOUNTER — Ambulatory Visit (INDEPENDENT_AMBULATORY_CARE_PROVIDER_SITE_OTHER): Payer: Medicaid Other

## 2014-10-29 VITALS — BP 102/60 | HR 88 | Resp 18

## 2014-10-29 DIAGNOSIS — M2042 Other hammer toe(s) (acquired), left foot: Secondary | ICD-10-CM

## 2014-10-29 DIAGNOSIS — Z9889 Other specified postprocedural states: Secondary | ICD-10-CM

## 2014-10-29 MED ORDER — TRAMADOL HCL 50 MG PO TABS: 50.0000 mg | ORAL_TABLET | Freq: Four times a day (QID) | ORAL | Status: DC | PRN

## 2014-10-29 NOTE — Progress Notes (Signed)
She presents today for a follow-up and postop visit for toes 4 and 5 of the left foot. She states that there is still mildly tender and swollen.  Objective: Vital signs are stable she is alert and oriented 3 pulses are palpable left foot. Toes and gone on to heal uneventfully moderate edema noticed. 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  Assessment: Well-healing surgical toes fourth and fifth left foot. Iron edema.  Plan: Wrap the toes with compression dressing daily I instructed her on how to do this and I will follow-up with her in 6 weeks or so.

## 2014-11-05 ENCOUNTER — Other Ambulatory Visit: Payer: Self-pay | Admitting: *Deleted

## 2014-11-05 MED ORDER — ALBUTEROL SULFATE (2.5 MG/3ML) 0.083% IN NEBU: INHALATION_SOLUTION | RESPIRATORY_TRACT | Status: DC

## 2014-11-26 ENCOUNTER — Encounter: Payer: Medicaid Other | Admitting: Podiatry

## 2014-11-29 ENCOUNTER — Encounter: Payer: Self-pay | Admitting: Nurse Practitioner

## 2014-11-29 ENCOUNTER — Ambulatory Visit (INDEPENDENT_AMBULATORY_CARE_PROVIDER_SITE_OTHER): Payer: Medicaid Other | Admitting: Nurse Practitioner

## 2014-11-29 VITALS — BP 139/91 | HR 96 | Temp 97.4°F | Ht 60.0 in | Wt 144.0 lb

## 2014-11-29 DIAGNOSIS — Z7251 High risk heterosexual behavior: Secondary | ICD-10-CM

## 2014-11-29 LAB — POCT WET PREP WITH KOH
Bacteria Wet Prep HPF POC: NEGATIVE
CLUE CELLS WET PREP PER HPF POC: NEGATIVE
KOH Prep POC: NEGATIVE
RBC Wet Prep HPF POC: NEGATIVE
TRICHOMONAS UA: NEGATIVE
WBC Wet Prep HPF POC: NEGATIVE
Yeast Wet Prep HPF POC: NEGATIVE

## 2014-11-29 NOTE — Patient Instructions (Signed)

## 2014-11-29 NOTE — Progress Notes (Signed)
   Subjective:    Patient ID: Brenda Duran, female    DOB: 03/09/1961, 54 y.o.   MRN: [id number]  HPI Patient was in a sexual relationship for 1  Month and then all the sudden he stopped talking to her. SHe heard from mutual friends that he was sleeping with  Others while he was with her. She was also told he may have an STD and she just wants to be checked out. SHe denies any vaginal discharge or pelvic pain.    Review of Systems  Constitutional: Negative.   HENT: Negative.   Respiratory: Negative.   Cardiovascular: Negative.   Genitourinary: Negative.   Neurological: Negative.   Psychiatric/Behavioral: Negative.   All other systems reviewed and are negative.      Objective:   Physical Exam  Constitutional: She is oriented to person, place, and time. She appears well-developed and well-nourished.  Cardiovascular: Normal rate, regular rhythm and normal heart sounds.   Abdominal: Soft. Bowel sounds are normal.  Genitourinary:  No pelvic exam  Performed today.  Neurological: She is alert and oriented to person, place, and time.  Skin: Skin is warm and dry.  Psychiatric: She has a normal mood and affect. Her behavior is normal. Judgment and thought content normal.   BP 139/91 mmHg  Pulse 96  Temp(Src) 97.4 F (36.3 C) (Oral)  Ht 5' (1.524 m)  Wt 144 lb (65.318 kg)  BMI 28.12 kg/m2  Wet prep normal     Assessment & Plan:   1. High risk sexual behavior    Labs pending Safe sex reviewed rto prn  Mary-Margaret Hassell Done, FNP

## 2014-11-30 LAB — STD PANEL
HIV-1/HIV-2 Ab: NONREACTIVE
Hepatitis B Surface Ag: NEGATIVE
RPR Ser Ql: NONREACTIVE

## 2014-12-04 LAB — GC/CHLAMYDIA PROBE AMP
Chlamydia trachomatis, NAA: NEGATIVE
Neisseria gonorrhoeae by PCR: NEGATIVE

## 2014-12-19 ENCOUNTER — Other Ambulatory Visit: Payer: Self-pay | Admitting: Family Medicine

## 2015-01-09 ENCOUNTER — Ambulatory Visit: Payer: Medicaid Other | Admitting: Podiatry

## 2015-01-21 ENCOUNTER — Encounter: Payer: Self-pay | Admitting: Podiatry

## 2015-01-21 ENCOUNTER — Ambulatory Visit (INDEPENDENT_AMBULATORY_CARE_PROVIDER_SITE_OTHER): Payer: Medicaid Other | Admitting: Podiatry

## 2015-01-21 VITALS — BP 121/85 | HR 80 | Resp 16

## 2015-01-21 DIAGNOSIS — B351 Tinea unguium: Secondary | ICD-10-CM | POA: Diagnosis not present

## 2015-01-21 NOTE — Progress Notes (Signed)
She presents today concerned about the toenail to her fifth digit growing back. She states that she does not have to deal with cutting.  Objective: Vital signs are stable she is alert and oriented 3. Pulses are palpable bilateral. Hammertoe deformities bilateral. Resulting in porokeratosis plantar aspect bilateral foot. Fifth toe demonstrates minimal nail growth left foot this appears to be more skin growth rather than nail growth.  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 No recurrence of the nail plate fifth digit left foot.  Plan: Debrided reactive hyperkeratotic tissue overlying the nail bed fifth digit left foot debridement already hyperkeratosis plantar aspect bilateral foot for her. She will follow up with me as needed

## 2015-01-27 ENCOUNTER — Telehealth: Payer: Self-pay | Admitting: Nurse Practitioner

## 2015-02-05 ENCOUNTER — Other Ambulatory Visit: Payer: Self-pay | Admitting: Family Medicine

## 2015-02-05 ENCOUNTER — Other Ambulatory Visit: Payer: Self-pay | Admitting: Nurse Practitioner

## 2015-02-05 ENCOUNTER — Telehealth: Payer: Self-pay | Admitting: Nurse Practitioner

## 2015-02-05 MED ORDER — ALBUTEROL SULFATE (2.5 MG/3ML) 0.083% IN NEBU: INHALATION_SOLUTION | RESPIRATORY_TRACT | Status: DC

## 2015-02-05 MED ORDER — IPRATROPIUM BROMIDE 0.02 % IN SOLN: RESPIRATORY_TRACT | Status: DC

## 2015-02-05 NOTE — Telephone Encounter (Signed)
done

## 2015-02-07 NOTE — Telephone Encounter (Signed)
[email or web address]

## 2015-02-26 ENCOUNTER — Other Ambulatory Visit: Payer: Self-pay | Admitting: Nurse Practitioner

## 2015-02-26 NOTE — Telephone Encounter (Signed)
Several attempts have been made to contact patient this enocunter will be closed.

## 2015-03-25 ENCOUNTER — Ambulatory Visit (INDEPENDENT_AMBULATORY_CARE_PROVIDER_SITE_OTHER): Payer: Medicaid Other | Admitting: Physician Assistant

## 2015-03-25 ENCOUNTER — Encounter: Payer: Self-pay | Admitting: Physician Assistant

## 2015-03-25 VITALS — BP 131/72 | HR 96 | Temp 97.1°F | Ht 60.0 in | Wt 146.0 lb

## 2015-03-25 DIAGNOSIS — J441 Chronic obstructive pulmonary disease with (acute) exacerbation: Secondary | ICD-10-CM

## 2015-03-25 MED ORDER — BUDESONIDE-FORMOTEROL FUMARATE 160-4.5 MCG/ACT IN AERO: 2.0000 | INHALATION_SPRAY | Freq: Two times a day (BID) | RESPIRATORY_TRACT | Status: DC

## 2015-03-25 MED ORDER — PREDNISONE 10 MG (21) PO TBPK: ORAL_TABLET | ORAL | Status: DC

## 2015-03-25 NOTE — Patient Instructions (Addendum)
Symbicort twice daily  Albuterol or nebulizer use every 4-6 hours Over the counter plain musinex  Cool mist humidifier.  Plenty of non caffeine fluids

## 2015-03-25 NOTE — Progress Notes (Signed)
   Subjective:    Patient ID: Brenda Duran, female    DOB: 08/03/1961, 54 y.o.   MRN: [id number]  HPI 54 y/o female presents for hospital f/u of pneumonia x 3 weeks ago. She was hospitalized x 7 days at [facility]. She states that she feels much better and is not having any breathing problems more than her normal asthma symptoms. She is using her nebulizer TID, which is normal for her.     Review of Systems  Constitutional: Negative.   HENT: Positive for congestion (current every day smoker, chronic congestion).   Respiratory: Positive for cough (chronic, productive at times. ), chest tightness and shortness of breath (no increased. patient has chronic SOB d/t COPD ).   Cardiovascular: Negative for chest pain.  All other systems reviewed and are negative.      Objective:   Physical Exam  Constitutional: She is oriented to person, place, and time. She appears well-developed and well-nourished.  Cardiovascular: Normal rate, regular rhythm and normal heart sounds.  Exam reveals no gallop and no friction rub.   No murmur heard. Pulmonary/Chest: Effort normal. No respiratory distress. She has wheezes (end inspiratory and expiratory wheezes bilaterally ).  Neurological: She is alert and oriented to person, place, and time.  Psychiatric: She has a normal mood and affect. Her behavior is normal. Judgment and thought content normal.  Nursing note and vitals reviewed.         Assessment & Plan:  1. COPD exacerbation  - budesonide-formoterol (SYMBICORT) 160-4.5 MCG/ACT inhaler; Inhale 2 puffs into the lungs 2 (two) times daily.  Dispense: 1 Inhaler; Refill: 12 - predniSONE (STERAPRED UNI-PAK 21 TAB) 10 MG (21) TBPK tablet; Take as directed  Dispense: 21 tablet; Refill: 0  Symbicort twice daily  Albuterol or nebulizer use every 4-6 hours Over the counter plain musinex  Cool mist humidifier.  Plenty of non caffeine fluids   Continue all meds Labs pending Health Maintenance  reviewed Diet and exercise encouraged RTO 2 weeks   Tiffany A. Benjamin Stain PA-C

## 2015-04-08 ENCOUNTER — Ambulatory Visit (INDEPENDENT_AMBULATORY_CARE_PROVIDER_SITE_OTHER): Payer: Medicaid Other | Admitting: Physician Assistant

## 2015-04-08 ENCOUNTER — Encounter: Payer: Self-pay | Admitting: Physician Assistant

## 2015-04-08 VITALS — BP 118/68 | HR 79 | Temp 98.5°F | Wt 144.0 lb

## 2015-04-08 DIAGNOSIS — M5440 Lumbago with sciatica, unspecified side: Secondary | ICD-10-CM | POA: Diagnosis not present

## 2015-04-08 DIAGNOSIS — M25511 Pain in right shoulder: Secondary | ICD-10-CM

## 2015-04-08 DIAGNOSIS — J441 Chronic obstructive pulmonary disease with (acute) exacerbation: Secondary | ICD-10-CM
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
## 2015-04-15 ENCOUNTER — Ambulatory Visit (INDEPENDENT_AMBULATORY_CARE_PROVIDER_SITE_OTHER): Payer: Medicaid Other | Admitting: Physician Assistant

## 2015-04-15 ENCOUNTER — Encounter: Payer: Self-pay | Admitting: Physician Assistant

## 2015-04-15 ENCOUNTER — Ambulatory Visit (INDEPENDENT_AMBULATORY_CARE_PROVIDER_SITE_OTHER): Payer: Medicaid Other

## 2015-04-15 VITALS — BP 121/88 | HR 91 | Temp 97.7°F | Ht 60.0 in | Wt 147.0 lb

## 2015-04-15 DIAGNOSIS — M25511 Pain in right shoulder: Secondary | ICD-10-CM

## 2015-04-15 MED ORDER — HYDROCODONE-ACETAMINOPHEN 5-325 MG PO TABS: 1.0000 | ORAL_TABLET | Freq: Four times a day (QID) | ORAL | Status: DC | PRN
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
## 2015-04-23 ENCOUNTER — Other Ambulatory Visit: Payer: Self-pay | Admitting: Family Medicine

## 2015-04-23 ENCOUNTER — Other Ambulatory Visit: Payer: Self-pay | Admitting: Nurse Practitioner

## 2015-04-30 ENCOUNTER — Telehealth: Payer: Self-pay | Admitting: Physician Assistant

## 2015-05-01 NOTE — Telephone Encounter (Signed)
Aware, script for bedside commode is being addressed.

## 2015-05-27 ENCOUNTER — Other Ambulatory Visit: Payer: Self-pay | Admitting: Family Medicine

## 2015-07-01 ENCOUNTER — Other Ambulatory Visit: Payer: Self-pay | Admitting: Physician Assistant

## 2015-07-02 ENCOUNTER — Ambulatory Visit (INDEPENDENT_AMBULATORY_CARE_PROVIDER_SITE_OTHER): Payer: Medicaid Other | Admitting: Physician Assistant

## 2015-07-02 ENCOUNTER — Encounter: Payer: Self-pay | Admitting: Physician Assistant

## 2015-07-02 VITALS — BP 114/79 | HR 81 | Temp 98.3°F | Ht 60.0 in | Wt 141.0 lb

## 2015-07-02 DIAGNOSIS — J42 Unspecified chronic bronchitis: Secondary | ICD-10-CM

## 2015-07-02 DIAGNOSIS — F32A Depression, unspecified: Secondary | ICD-10-CM

## 2015-07-02 DIAGNOSIS — K219 Gastro-esophageal reflux disease without esophagitis: Secondary | ICD-10-CM | POA: Diagnosis not present

## 2015-07-02 DIAGNOSIS — F329 Major depressive disorder, single episode, unspecified: Secondary | ICD-10-CM

## 2015-07-02 MED ORDER — OMEPRAZOLE 20 MG PO CPDR: 20.0000 mg | DELAYED_RELEASE_CAPSULE | Freq: Two times a day (BID) | ORAL | Status: DC

## 2015-07-02 MED ORDER — TIOTROPIUM BROMIDE MONOHYDRATE 18 MCG IN CAPS: 18.0000 ug | ORAL_CAPSULE | Freq: Every day | RESPIRATORY_TRACT | Status: DC

## 2015-07-02 MED ORDER — DULOXETINE HCL 30 MG PO CPEP: ORAL_CAPSULE | ORAL | Status: DC

## 2015-07-02 NOTE — Progress Notes (Signed)
   Subjective:    Patient ID: Brenda Duran, female    DOB: 1961-04-19, 54 y.o.   MRN: [id number]  HPI 54 Y/O female presents requesting change in medication. She states that she is having increased symptoms of belching and regurgitation with taking Omeprazole 20mg  q day.   She would also like her Celexa changed to a different medicaiton. She has been taking this for several years and feels that its not working because she has been suffering from increased depression. She has had increased pain due to orthopedic issues and thinks this is contributing to her depression.   She also would like a medication for her nebulizer that she has been using since she was in the hospital. She does not know what the medicaiton was but states that it started with an S. Possibly Spireva.     Review of Systems  Gastrointestinal:       Regurgitation, belching  Musculoskeletal: Positive for myalgias, arthralgias (bilateral shoulder pain ) and neck pain.  Psychiatric/Behavioral: Positive for dysphoric mood.       Objective:   Physical Exam  Constitutional: She is oriented to person, place, and time. She appears well-developed and well-nourished. No distress.  Cardiovascular: Normal rate and normal heart sounds.  Exam reveals no gallop and no friction rub.   No murmur heard. Pulmonary/Chest: Effort normal. No respiratory distress. She has wheezes (generalized ). She exhibits no tenderness.  Musculoskeletal: She exhibits no edema.  Neurological: She is alert and oriented to person, place, and time.  Skin: No rash noted. She is not diaphoretic. No erythema.  Psychiatric: She has a normal mood and affect. Her behavior is normal. Judgment and thought content normal.  Nursing note and vitals reviewed.         Assessment & Plan:  1. Depression - Stop Celexa.  - DULoxetine (CYMBALTA) 30 MG capsule; Take 1 capsule q day on week 1. Increase to 2 capsules q day on week 2.  Dispense: 60 capsule; Refill: 3  2.  Gastroesophageal reflux disease, esophagitis presence not specified  - omeprazole (PRILOSEC) 20 MG capsule; Take 1 capsule (20 mg total) by mouth 2 (two) times daily before a meal.  Dispense: 60 capsule; Refill: 5  3. Chronic bronchitis, unspecified chronic bronchitis type  - tiotropium (SPIRIVA HANDIHALER) 18 MCG inhalation capsule; Place 1 capsule (18 mcg total) into inhaler and inhale daily.  Dispense: 30 capsule; Refill: 12   Continue all meds Labs pending Health Maintenance reviewed Diet and exercise encouraged RTO 6 weeks   Alexi Dorminey A. Benjamin Stain PA-C

## 2015-07-17 ENCOUNTER — Ambulatory Visit (INDEPENDENT_AMBULATORY_CARE_PROVIDER_SITE_OTHER): Payer: Medicaid Other | Admitting: Family Medicine

## 2015-07-17 ENCOUNTER — Encounter: Payer: Self-pay | Admitting: Family Medicine

## 2015-07-17 VITALS — BP 107/77 | HR 91 | Temp 98.1°F | Ht 61.52 in | Wt 140.2 lb

## 2015-07-17 DIAGNOSIS — J449 Chronic obstructive pulmonary disease, unspecified: Secondary | ICD-10-CM | POA: Insufficient documentation

## 2015-07-17 DIAGNOSIS — J441 Chronic obstructive pulmonary disease with (acute) exacerbation: Secondary | ICD-10-CM

## 2015-07-17 MED ORDER — PREDNISONE 20 MG PO TABS: ORAL_TABLET | ORAL | Status: DC

## 2015-07-17 MED ORDER — MOMETASONE FURO-FORMOTEROL FUM 100-5 MCG/ACT IN AERO: 2.0000 | INHALATION_SPRAY | Freq: Two times a day (BID) | RESPIRATORY_TRACT | Status: DC

## 2015-07-17 NOTE — Patient Instructions (Signed)
Great to see you!  Start dulera as soon as possible, this is an everyday medication  Prednisone is a short term medicine for an exacerbation  Chronic Obstructive Pulmonary Disease Chronic obstructive pulmonary disease (COPD) is a common lung condition in which airflow from the lungs is limited. COPD is a general term that can be used to describe many different lung problems that limit airflow, including both chronic bronchitis and emphysema. If you have COPD, your lung function will probably never return to normal, but there are measures you can take to improve lung function and make yourself feel better.  CAUSES   Smoking (common).   Exposure to secondhand smoke.   Genetic problems.  Chronic inflammatory lung diseases or recurrent infections. SYMPTOMS   Shortness of breath, especially with physical activity.   Deep, persistent (chronic) cough with a large amount of thick mucus.   Wheezing.   Rapid breaths (tachypnea).   Gray or bluish discoloration (cyanosis) of the skin, especially in fingers, toes, or lips.   Fatigue.   Weight loss.   Frequent infections or episodes when breathing symptoms become much worse (exacerbations).   Chest tightness. DIAGNOSIS  Your health care Boston Cookson will take a medical history and perform a physical examination to make the initial diagnosis. Additional tests for COPD may include:   Lung (pulmonary) function tests.  Chest X-ray.  CT scan.  Blood tests. TREATMENT  Treatment available to help you feel better when you have COPD includes:   Inhaler and nebulizer medicines. These help manage the symptoms of COPD and make your breathing more comfortable.  Supplemental oxygen. Supplemental oxygen is only helpful if you have a low oxygen level in your blood.   Exercise and physical activity. These are beneficial for nearly all people with COPD. Some people may also benefit from a pulmonary rehabilitation program. HOME CARE  INSTRUCTIONS   Take all medicines (inhaled or pills) as directed by your health care Tayshun Gappa.  Avoid over-the-counter medicines or cough syrups that dry up your airway (such as antihistamines) and slow down the elimination of secretions unless instructed otherwise by your health care Eliabeth Shoff.   If you are a smoker, the most important thing that you can do is stop smoking. Continuing to smoke will cause further lung damage and breathing trouble. Ask your health care Kewon Statler for help with quitting smoking. He or she can direct you to community resources or hospitals that provide support.  Avoid exposure to irritants such as smoke, chemicals, and fumes that aggravate your breathing.  Use oxygen therapy and pulmonary rehabilitation if directed by your health care Yatziri Wainwright. If you require home oxygen therapy, ask your health care Carinne Brandenburger whether you should purchase a pulse oximeter to measure your oxygen level at home.   Avoid contact with individuals who have a contagious illness.  Avoid extreme temperature and humidity changes.  Eat healthy foods. Eating smaller, more frequent meals and resting before meals may help you maintain your strength.  Stay active, but balance activity with periods of rest. Exercise and physical activity will help you maintain your ability to do things you want to do.  Preventing infection and hospitalization is very important when you have COPD. Make sure to receive all the vaccines your health care Denessa Cavan recommends, especially the pneumococcal and influenza vaccines. Ask your health care Yaniris Braddock whether you need a pneumonia vaccine.  Learn and use relaxation techniques to manage stress.  Learn and use controlled breathing techniques as directed by your health care Theador Jezewski. Controlled  breathing techniques include:   Pursed lip breathing. Start by breathing in (inhaling) through your nose for 1 second. Then, purse your lips as if you were going to whistle  and breathe out (exhale) through the pursed lips for 2 seconds.   Diaphragmatic breathing. Start by putting one hand on your abdomen just above your waist. Inhale slowly through your nose. The hand on your abdomen should move out. Then purse your lips and exhale slowly. You should be able to feel the hand on your abdomen moving in as you exhale.   Learn and use controlled coughing to clear mucus from your lungs. Controlled coughing is a series of short, progressive coughs. The steps of controlled coughing are:  1. Lean your head slightly forward.  2. Breathe in deeply using diaphragmatic breathing.  3. Try to hold your breath for 3 seconds.  4. Keep your mouth slightly open while coughing twice.  5. Spit any mucus out into a tissue.  6. Rest and repeat the steps once or twice as needed. SEEK MEDICAL CARE IF:   You are coughing up more mucus than usual.   There is a change in the color or thickness of your mucus.   Your breathing is more labored than usual.   Your breathing is faster than usual.  SEEK IMMEDIATE MEDICAL CARE IF:   You have shortness of breath while you are resting.   You have shortness of breath that prevents you from:  Being able to talk.   Performing your usual physical activities.   You have chest pain lasting longer than 5 minutes.   Your skin color is more cyanotic than usual.  You measure low oxygen saturations for longer than 5 minutes with a pulse oximeter. MAKE SURE YOU:   Understand these instructions.  Will watch your condition.  Will get help right away if you are not doing well or get worse. Document Released: 07/14/2005 Document Revised: 02/18/2014 Document Reviewed: 05/31/2013 [facility] Patient Information 2015 [place], [place]. This information is not intended to replace advice given to you by your health care Ashauna Bertholf. Make sure you discuss any questions you have with your health care Aleecia Tapia.

## 2015-07-17 NOTE — Progress Notes (Signed)
   HPI  Patient presents today here for evaluation of cough  She has known COPD and takes Spiriva every day. She uses albuterol, DuoNeb's, 2-3 times daily because of dyspnea and cough. She had a planned shoulder surgery one day ago which could not be done because of her breathing. She states that she was spraying pesticides about 3 days ago which she feels spurred her current exacerbation. She notes increased dyspnea, increased productive cough for the last 3 days, it seems to be resolving.  Her albuterol is helping. She states breathing daily She is interested in quitting smoking, she is trying the patch for now. She is interested in Chantix but also has some mood concerns.  PMH: Smoking status noted ROS: Per HPI  Objective: BP 107/77 mmHg  Pulse 91  Temp(Src) 98.1 F (36.7 C) (Oral)  Ht 5' 1.52" (1.563 m)  Wt 140 lb 3.2 oz (63.594 kg)  BMI 26.03 kg/m2 Gen: NAD, alert, cooperative with exam HEENT: NCAT CV: RRR, good S1/S2, no murmur Resp: Nonlabored, decreased air movement, expiratory wheezes throughout Ext: No edema, warm Neuro: Alert and oriented, No gross deficits Psych: No suicidal ideation  Assessment and plan:  # COPD, COPD exacerbation Prednisone course, no concern for infection at this time so will avoid anti-box. Start Dulera, continues Spiriva Refer to pulm   Smoking Trying patch now, consider chantix but i am cautious with mood disorders Recommended discussion with our clinical pharmacist.    Orders Placed This Encounter  Procedures  . Ambulatory referral to Pulmonology    Referral Priority:  Routine    Referral Type:  Consultation    Referral Reason:  Specialty Services Required    Requested Specialty:  Pulmonary Disease    Number of Visits Requested:  1    Meds ordered this encounter  Medications  . mometasone-formoterol (DULERA) 100-5 MCG/ACT AERO    Sig: Inhale 2 puffs into the lungs 2 (two) times daily.    Dispense:  13 g    Refill:  5    . predniSONE (DELTASONE) 20 MG tablet    Sig: Take 2 tabs daily for 4 days, then take 1 tab daily for 4 days, then take one half tab daily 4 days then stop.    Dispense:  14 tablet    Refill:  0    Laroy Apple, MD [facility] 07/17/2015, 10:57 AM

## 2015-08-05 ENCOUNTER — Encounter: Payer: Self-pay | Admitting: Pulmonary Disease

## 2015-08-05 ENCOUNTER — Ambulatory Visit (INDEPENDENT_AMBULATORY_CARE_PROVIDER_SITE_OTHER): Payer: Medicaid Other | Admitting: Pulmonary Disease

## 2015-08-05 VITALS — BP 112/64 | HR 96 | Temp 98.4°F | Ht 59.0 in | Wt 143.4 lb

## 2015-08-05 DIAGNOSIS — J439 Emphysema, unspecified: Secondary | ICD-10-CM

## 2015-08-05 MED ORDER — PREDNISONE 10 MG PO TABS: ORAL_TABLET | ORAL | Status: DC

## 2015-08-05 NOTE — Progress Notes (Signed)
Subjective:    Patient ID: Brenda Duran, female    DOB: [birth year], 54 y.o.   MRN: [id number]  HPI  Follow-up for evaluation of COPD.  Brenda Duran is 54 year old with COPD, active smoker. She saw Dr. Melvyn Novas in 2008. PFTs at that time showed moderate COPD. She has been maintained on Spiriva. She had a scheduled surgery for shoulder rotator cuff repair in September. This was canceled since she was wheezing at that time. She was evaluated by her primary care physician Dr. Wendi Snipes who started her on a prednisone taper and added Symbicort to the Spiriva. She states that her wheezing and dyspnea has improved considerably but not back to baseline. She denies any cough, sputum production, fevers, chills or any other infectious complaints.  She has chronic dyspnea on exertion at baseline. This is worsened by exposure to heat humidity and smoking. His symptoms are accompanied by wheezing and productive cough.  She is on disability. She has a 35 PPD smoking history continues to smoke 1 pack per day. She is trying to quit. She uses nicotine patches sometimes.  Data: PFTs 05/09/07 FEV1 64% F/F 49% Diffusion capacity 58% No bronchodilator response.  CXR (06/17/14) Chronic scoliosis. Chronic inferior right posterior and lateral rib fractures appear stable, and are associated with mild chronic increased peripheral right lung base opacity. Mild irregularity also of left lateral fourth and fifth ribs appear stable. Advanced chronic degenerative changes at the left shoulder. No acute osseous abnormality identified.  Sleep study (03/24/12) AHI-0, RDI-0, desats noted percent on percent unrelated to apnea events.  Past Medical History  Diagnosis Date  . Back pain   . Depression   . Hypertension   . COPD (chronic obstructive pulmonary disease) ([place])   . Osteopenia      Current outpatient prescriptions:  .  albuterol (PROVENTIL) (2.5 MG/3ML) 0.083% nebulizer solution, NEBULIZE 1 VIAL EVERY 4 HOURS AS  NEEDED, Disp: 180 mL, Rfl: 2 .  albuterol-ipratropium (COMBIVENT) 18-103 MCG/ACT inhaler, Inhale 2 puffs into the lungs every 6 (six) hours as needed. wheezing, Disp: 14.7 g, Rfl: 6 .  alendronate (FOSAMAX) 70 MG tablet, Take 1 tablet (70 mg total) by mouth every 7 (seven) days. Take with a full glass of water on an empty stomach., Disp: 4 tablet, Rfl: 11 .  amLODipine (NORVASC) 10 MG tablet, Take 10 mg by mouth daily., Disp: , Rfl:  .  budesonide-formoterol (SYMBICORT) 160-4.5 MCG/ACT inhaler, Inhale 2 puffs into the lungs 2 (two) times daily., Disp: 1 Inhaler, Rfl: 12 .  DULoxetine (CYMBALTA) 30 MG capsule, Take 1 capsule q day on week 1. Increase to 2 capsules q day on week 2., Disp: 60 capsule, Rfl: 3 .  HYDROcodone-acetaminophen (NORCO) 5-325 MG per tablet, Take 1-2 tablets by mouth every 6 (six) hours as needed for moderate pain., Disp: 60 tablet, Rfl: 0 .  ipratropium (ATROVENT) 0.02 % nebulizer solution, NEBULIZE 1 VIAL 4 TIMES A DAY, Disp: 312.5 mL, Rfl: 2 .  methocarbamol (ROBAXIN) 500 MG tablet, Take 1-2 tablets (500-1,000 mg total) by mouth 2 (two) times daily., Disp: 60 tablet, Rfl: 5 .  mometasone-formoterol (DULERA) 100-5 MCG/ACT AERO, Inhale 2 puffs into the lungs 2 (two) times daily., Disp: 13 g, Rfl: 5 .  naproxen (NAPROSYN) 500 MG tablet, TAKE 1 TABLET TWICE DAILY WITH BREAKFAST & SUPPER, Disp: 60 tablet, Rfl: 2 .  omeprazole (PRILOSEC) 20 MG capsule, Take 1 capsule (20 mg total) by mouth 2 (two) times daily before a meal., Disp: 60 capsule,  Rfl: 5 .  tiotropium (SPIRIVA HANDIHALER) 18 MCG inhalation capsule, Place 1 capsule (18 mcg total) into inhaler and inhale daily., Disp: 30 capsule, Rfl: 12 .  traZODone (DESYREL) 100 MG tablet, Take 200 mg by mouth at bedtime. , Disp: , Rfl:   Review of Systems  Dyspnea on exertion, cough productive of white sputum, occasional chest pain. No palpitations She has nasal congestion, sneezing She complains of joint pain and stiffness. No  nausea vomiting diarrhea constipation. All other review of systems are negative     Objective:   Physical Exam Blood pressure 112/64, pulse 96, temperature 98.4 F (36.9 C), temperature source Oral, height 4\' 11"  (1.499 m), weight 143 lb 6.4 oz (65.046 kg), SpO2 93 %.  Gen.: No apparent distress Neuro: No gross focal deficits. Neck: No JVD, lymphadenopathy, thyromegaly. RS: Faint B/L wheeze CVS: S1-S2 heard, no murmurs rubs gallops. Abdomen: Soft, positive bowel sounds. Extremities: No edema.    Assessment & Plan:   #1 COPD Patient had a recent COPD exacerbation. She is doing better after prednisone taper starting at 40 mg. However she is still a little dyspneic on examination today and has wheeze. We will treat her with another short prednisone taper before reconsidering the shoulder surgery. I have asked her to continue to take the Symbicort and Spiriva. I'll also get lung function tests for evaluation of her pulmonary function since the last study was done in 2008.  #2 Smoking I have encouraged her to quit smoking before the surgery. She'll continue to use the nicotine patches and is not interested in any other additional smoking cessation help.  #2 nocturnal hypoxemia. Sleep study from 2013 noted. It shows desaturations at night but no OSA. She may need nocturnal oxygen. I will get an overnight oximetry to evaluate. At the next visit she'll also get ambulated for evaluation of exertional saturation.  Plan: - Prednisone taper starting at 40 mg. Reduce by 10 mg every 2 days - Overnight oximetry - Pulmonary function tests.  Return to clinic in 2 weeks.  Marshell Garfinkel MD [facility] Pager [phone] If no answer or after 3pm call: [phone] 08/05/2015, 2:24 PM

## 2015-08-05 NOTE — Patient Instructions (Signed)
We will give you another prednisone taper. Encouraged to stop smoking. We will order lung function tests and check oxygen levels at night. I will see you in 2-3 weeks.

## 2015-08-06 ENCOUNTER — Other Ambulatory Visit: Payer: Self-pay | Admitting: Family Medicine

## 2015-08-07 ENCOUNTER — Telehealth: Payer: Self-pay | Admitting: Pulmonary Disease

## 2015-08-07 MED ORDER — PREDNISONE 10 MG PO TABS: ORAL_TABLET | ORAL | Status: DC

## 2015-08-07 NOTE — Telephone Encounter (Signed)
Called and spoke to pharmacy tech at [facility]. Pred taper was not phoned in as documented. Pred taper sent into pharmacy. Called and spoke to pt. Informed her the rx has been sent in. Pt verbalized understanding and denied any further questions or concerns at this time.

## 2015-08-07 NOTE — Telephone Encounter (Signed)
Pt calling stating that pharm is telling her that they don't have rx for predinsone this was to be called to [facility] please call pt when tis has been done @ [phone].Hillery Hunter

## 2015-08-12 ENCOUNTER — Telehealth: Payer: Self-pay | Admitting: Pulmonary Disease

## 2015-08-12 NOTE — Telephone Encounter (Signed)
Spoke with pt, states that the order for her ONO has not been placed to [facility].    PCC's please advise on status of ONO.  Thanks!

## 2015-08-12 NOTE — Telephone Encounter (Signed)
Spoke with pt, states she was returning a call to someone in the office.  I see no documentation of any call since I spoke with the pt at 11:32.    PCC's please advise.  Thanks!

## 2015-08-12 NOTE — Telephone Encounter (Signed)
I am refaxing the ONO Order & records to [place] with pt's insurance card.  Spoke with patient and she is aware this has been refaxed.

## 2015-08-12 NOTE — Telephone Encounter (Signed)
Pt returning call.Brenda Duran ° °

## 2015-08-14 ENCOUNTER — Ambulatory Visit (INDEPENDENT_AMBULATORY_CARE_PROVIDER_SITE_OTHER): Payer: Medicaid Other

## 2015-08-14 ENCOUNTER — Ambulatory Visit (INDEPENDENT_AMBULATORY_CARE_PROVIDER_SITE_OTHER): Payer: Medicaid Other | Admitting: Family Medicine

## 2015-08-14 ENCOUNTER — Encounter: Payer: Self-pay | Admitting: Family Medicine

## 2015-08-14 VITALS — BP 108/76 | HR 85 | Temp 97.6°F | Ht 59.0 in | Wt 141.6 lb

## 2015-08-14 DIAGNOSIS — M25512 Pain in left shoulder: Secondary | ICD-10-CM

## 2015-08-14 DIAGNOSIS — M25511 Pain in right shoulder: Secondary | ICD-10-CM | POA: Insufficient documentation

## 2015-08-14 DIAGNOSIS — I10 Essential (primary) hypertension: Secondary | ICD-10-CM | POA: Diagnosis not present

## 2015-08-14 DIAGNOSIS — Z0000 Encounter for general adult medical examination without abnormal findings: Secondary | ICD-10-CM | POA: Insufficient documentation

## 2015-08-14 DIAGNOSIS — Z23 Encounter for immunization: Secondary | ICD-10-CM

## 2015-08-14 NOTE — Patient Instructions (Addendum)
Great to see you!  You need to arrange a mammogram  Lets get you back here to have a physical in 2 months  We will call with your labs within 1 week.

## 2015-08-14 NOTE — Addendum Note (Signed)
Addended by: Timmothy Euler on: 08/14/2015 12:05 PM   Modules accepted: Miquel Dunn

## 2015-08-14 NOTE — Progress Notes (Addendum)
   HPI  Patient presents today for COPD and medication follow-up.  COPD Feeling better on Dulera, taking Spiriva and Dulera every day. Also taking Symbicort intermittently States that her cough and breathing have improved since starting [facility] also finishing a recent taper pulmonology for exacerbation. Follow-up with them.  Left shoulder pain States 2-3 weeks of anterior left shoulder pain, she denies any trauma but states that it may be worse because she is compensating for her right shoulder which she has rescheduled 4. She requests an x-ray as she had a dislocation years ago.  Still smoking but cutting back  Healthcare maintenance No mammogram in over 2 years She has drank coffee this morning with artificial sweetener needs a Flu shot   PMH: Smoking status noted ROS: Per HPI  Objective: BP 108/76 mmHg  Pulse 85  Temp(Src) 97.6 F (36.4 C) (Oral)  Ht 4\' 11"  (1.499 m)  Wt 141 lb 9.6 oz (64.229 kg)  BMI 28.58 kg/m2 Gen: NAD, alert, cooperative with exam HEENT: NCAT CV: RRR, good S1/S2, no murmur Resp: CTABL, no wheezes, non-labored, decreased air movement Ext: No edema, warm Neuro: Alert and oriented, No gross deficits L shoulder without tenderness to palp, + hawkins and empty can test, no limitations in routine use on my observation  DG L shoulder - No acute bony abnormaility  Assessment and plan:  # COPD Improved with  Dulera Continue Spiriva Discontinue Symbicort as it is similar to [facility] Discussed albuterol is her rescue inhaler Has pulmonology follow-up scheduled  # Healthcare maintenance Flu shot today HIV, Hep C Nonfasting labs Mammogram scheduled for Physical in 2-3 months  # Left shoulder pain Some signs of rotator cuff tendinopathy with positive Hawkins and empty can tests Strength maintained X-ray per her request, no signs of dislocation Has orthopedic follow-up as well  Tolerating Cymbalta well, using for musculoskeletal  pain Denies SI   Orders Placed This Encounter  Procedures  . DG Shoulder Left    Standing Status: Future     Number of Occurrences:      Standing Expiration Date: 10/13/2016    Order Specific Question:  Reason for Exam (SYMPTOM  OR DIAGNOSIS REQUIRED)    Answer:  pain in L shoulder    Order Specific Question:  Is the patient pregnant?    Answer:  No    Order Specific Question:  Preferred imaging location?    Answer:  Internal    Laroy Apple, MD [facility] 08/14/2015, 10:17 AM

## 2015-08-15 LAB — LIPID PANEL
CHOL/HDL RATIO: 2.2 ratio (ref 0.0–4.4)
Cholesterol, Total: 168 mg/dL (ref 100–199)
HDL: 75 mg/dL (ref >39)
LDL CALC: 77 mg/dL (ref 0–99)
TRIGLYCERIDES: 80 mg/dL (ref 0–149)
VLDL CHOLESTEROL CAL: 16 mg/dL (ref 5–40)

## 2015-08-15 LAB — CMP14+EGFR
ALT: 15 IU/L (ref 0–32)
AST: 17 IU/L (ref 0–40)
Albumin/Globulin Ratio: 1.8 (ref 1.1–2.5)
Albumin: 4.4 g/dL (ref 3.5–5.5)
Alkaline Phosphatase: 79 IU/L (ref 39–117)
BUN/Creatinine Ratio: 26 — ABNORMAL HIGH (ref 9–23)
BUN: 16 mg/dL (ref 6–24)
Bilirubin Total: <0.2 mg/dL (ref 0.0–1.2)
CALCIUM: 10.2 mg/dL (ref 8.7–10.2)
CO2: 24 mmol/L (ref 18–29)
CREATININE: 0.62 mg/dL (ref 0.57–1.00)
Chloride: 95 mmol/L — ABNORMAL LOW (ref 97–106)
GFR calc Af Amer: 119 mL/min/{1.73_m2} (ref >59)
GFR, EST NON AFRICAN AMERICAN: 103 mL/min/{1.73_m2} (ref >59)
GLOBULIN, TOTAL: 2.4 g/dL (ref 1.5–4.5)
Glucose: 137 mg/dL — ABNORMAL HIGH (ref 65–99)
Potassium: 4.9 mmol/L (ref 3.5–5.2)
SODIUM: 136 mmol/L (ref 136–144)
Total Protein: 6.8 g/dL (ref 6.0–8.5)

## 2015-08-15 LAB — CBC
HEMATOCRIT: 47.7 % — AB (ref 34.0–46.6)
Hemoglobin: 15.7 g/dL (ref 11.1–15.9)
MCH: 29.8 pg (ref 26.6–33.0)
MCHC: 32.9 g/dL (ref 31.5–35.7)
MCV: 91 fL (ref 79–97)
Platelets: 284 10*3/uL (ref 150–379)
RBC: 5.26 x10E6/uL (ref 3.77–5.28)
RDW: 14 % (ref 12.3–15.4)
WBC: 9.7 10*3/uL (ref 3.4–10.8)

## 2015-08-15 LAB — HEPATITIS C ANTIBODY: Hep C Virus Ab: <0.1 s/co ratio (ref 0.0–0.9)

## 2015-08-15 LAB — T4, FREE: Free T4: 1.09 ng/dL (ref 0.82–1.77)

## 2015-08-15 LAB — HIV ANTIBODY (ROUTINE TESTING W REFLEX): HIV Screen 4th Generation wRfx: NONREACTIVE

## 2015-08-15 LAB — TSH: TSH: 0.627 u[IU]/mL (ref 0.450–4.500)

## 2015-08-19 ENCOUNTER — Telehealth: Payer: Self-pay | Admitting: Family Medicine

## 2015-08-19 NOTE — Telephone Encounter (Signed)
Patient aware of results and copy sent to Dr. Berenice Primas

## 2015-08-20 ENCOUNTER — Ambulatory Visit: Payer: Medicaid Other | Admitting: Pulmonary Disease

## 2015-08-20 ENCOUNTER — Encounter (HOSPITAL_COMMUNITY): Payer: Medicaid Other

## 2015-08-21 ENCOUNTER — Ambulatory Visit (HOSPITAL_COMMUNITY)
Admission: RE | Admit: 2015-08-21 | Discharge: 2015-08-21 | Disposition: A | Discharge status: Home / Self Care | Payer: Medicaid Other | Source: Ambulatory Visit | Attending: Pulmonary Disease | Admitting: Pulmonary Disease

## 2015-08-21 ENCOUNTER — Ambulatory Visit (INDEPENDENT_AMBULATORY_CARE_PROVIDER_SITE_OTHER): Payer: Medicaid Other | Admitting: Pulmonary Disease

## 2015-08-21 ENCOUNTER — Encounter: Payer: Self-pay | Admitting: Pulmonary Disease

## 2015-08-21 VITALS — BP 122/82 | HR 91 | Ht 59.0 in | Wt 144.0 lb

## 2015-08-21 DIAGNOSIS — J441 Chronic obstructive pulmonary disease with (acute) exacerbation: Secondary | ICD-10-CM | POA: Diagnosis not present

## 2015-08-21 DIAGNOSIS — J439 Emphysema, unspecified: Secondary | ICD-10-CM | POA: Diagnosis present

## 2015-08-21 LAB — PULMONARY FUNCTION TEST
DL/VA % PRED: 54 %
DL/VA: 2.24 ml/min/mmHg/L
DLCO COR: 9.03 ml/min/mmHg
DLCO UNC % PRED: 54 %
DLCO UNC: 9.61 ml/min/mmHg
DLCO cor % pred: 51 %
FEF 25-75 PRE: 0.34 L/s
FEF 25-75 Post: 0.41 L/sec
FEF2575-%CHANGE-POST: 20 %
FEF2575-%PRED-POST: 17 %
FEF2575-%PRED-PRE: 14 %
FEV1-%Change-Post: 6 %
FEV1-%PRED-POST: 48 %
FEV1-%PRED-PRE: 45 %
FEV1-PRE: 1.03 L
FEV1-Post: 1.1 L
FEV1FVC-%CHANGE-POST: 6 %
FEV1FVC-%PRED-PRE: 49 %
FEV6-%CHANGE-POST: 5 %
FEV6-%PRED-POST: 81 %
FEV6-%Pred-Pre: 77 %
FEV6-PRE: 2.17 L
FEV6-Post: 2.29 L
FEV6FVC-%CHANGE-POST: 5 %
FEV6FVC-%Pred-Post: 91 %
FEV6FVC-%Pred-Pre: 86 %
FVC-%Change-Post: 0 %
FVC-%Pred-Post: 89 %
FVC-%Pred-Pre: 89 %
FVC-Post: 2.58 L
FVC-Pre: 2.58 L
POST FEV1/FVC RATIO: 43 %
PRE FEV1/FVC RATIO: 40 %
Post FEV6/FVC ratio: 89 %
Pre FEV6/FVC Ratio: 84 %
RV % pred: 175 %
RV: 2.8 L
TLC % pred: 128 %
TLC: 5.54 L

## 2015-08-21 MED ORDER — ALBUTEROL SULFATE (2.5 MG/3ML) 0.083% IN NEBU
2.5000 mg | INHALATION_SOLUTION | Freq: Once | RESPIRATORY_TRACT | Status: AC
  Administered 2015-08-21: 2.5 mg via RESPIRATORY_TRACT

## 2015-08-21 NOTE — Patient Instructions (Signed)
Continue using the [facility] and Spiriva as prescribed. We will get in touch with orthopedic surgeon and primary care physician regarding the okay to go ahead with the shoulder surgery.  Return to clinic in 6 months.

## 2015-08-21 NOTE — Progress Notes (Signed)
Subjective:    Patient ID: Brenda Duran, female    DOB: 05/29/1961, 54 y.o.   MRN: [id number]   PROBLEM LIST: COPD Gold Class III Active smoker.  HPI  Brenda Duran is 54 year old with COPD, active smoker. PFTs at that time showed moderate COPD. She has been maintained on Spiriva. She had a scheduled surgery for shoulder rotator cuff repair in September. This was canceled since she was wheezing at that time. She was evaluated by her primary care physician Dr. Wendi Snipes who started her on a prednisone taper and added Symbicort to the Spiriva. She states that her wheezing and dyspnea has improved considerably but not back to baseline. She denies any cough, sputum production, fevers, chills or any other infectious complaints.  She has chronic dyspnea on exertion at baseline. This is worsened by exposure to heat humidity and smoking. His symptoms are accompanied by wheezing and productive cough.  She is on disability. She has a 35 PPD smoking history continues to smoke 1 pack per day. She is trying to quit. She uses nicotine patches sometimes.  Interim History: At last visit she was given another course of prednisone because she was still wheezing. She feels better now with improved dyspnea. She continues to use her Dulera and Spiriva. She has unrelated in the clinic today and his sats remained above 95%. She underwent an overnight oximetry this week the results of which are still pending.  Data: PFTs 05/09/07 FEV1 64% F/F 49% Diffusion capacity 58% No bronchodilator response.  08/21/15 FVC 2.58 [89%] FEV1 1.03 [45%] F/F 40 TLC 128% DLCO 54%. No broncho-dilator response.  CXR (06/17/14) Chronic scoliosis. Chronic inferior right posterior and lateral rib fractures appear stable, and are associated with mild chronic increased peripheral right lung base opacity. Mild irregularity also of left lateral fourth and fifth ribs appear stable. Advanced chronic degenerative changes at the left  shoulder. No acute osseous abnormality identified.  Sleep study (03/24/12) AHI-0, RDI-0, desats noted percent on percent unrelated to apnea events.  Past Medical History  Diagnosis Date  . Back pain   . Depression   . Hypertension   . COPD (chronic obstructive pulmonary disease) ([place])   . Osteopenia      Current outpatient prescriptions:  .  albuterol (PROVENTIL) (2.5 MG/3ML) 0.083% nebulizer solution, NEBULIZE 1 VIAL EVERY 4 HOURS AS NEEDED, Disp: 180 mL, Rfl: 2 .  albuterol-ipratropium (COMBIVENT) 18-103 MCG/ACT inhaler, Inhale 2 puffs into the lungs every 6 (six) hours as needed. wheezing, Disp: 14.7 g, Rfl: 6 .  alendronate (FOSAMAX) 70 MG tablet, Take 1 tablet (70 mg total) by mouth every 7 (seven) days. Take with a full glass of water on an empty stomach., Disp: 4 tablet, Rfl: 11 .  amLODipine (NORVASC) 10 MG tablet, Take 10 mg by mouth daily., Disp: , Rfl:  .  DULoxetine (CYMBALTA) 30 MG capsule, Take 1 capsule q day on week 1. Increase to 2 capsules q day on week 2., Disp: 60 capsule, Rfl: 3 .  HYDROcodone-acetaminophen (NORCO) 5-325 MG per tablet, Take 1-2 tablets by mouth every 6 (six) hours as needed for moderate pain., Disp: 60 tablet, Rfl: 0 .  ipratropium (ATROVENT) 0.02 % nebulizer solution, NEBULIZE 1 VIAL 4 TIMES A DAY, Disp: 312.5 mL, Rfl: 2 .  methocarbamol (ROBAXIN) 500 MG tablet, Take 1-2 tablets (500-1,000 mg total) by mouth 2 (two) times daily., Disp: 60 tablet, Rfl: 5 .  mometasone-formoterol (DULERA) 100-5 MCG/ACT AERO, Inhale 2 puffs into the lungs 2 (two)  times daily., Disp: 13 g, Rfl: 5 .  naproxen (NAPROSYN) 500 MG tablet, TAKE 1 TABLET TWICE DAILY WITH BREAKFAST & SUPPER, Disp: 60 tablet, Rfl: 2 .  naproxen (NAPROSYN) 500 MG tablet, TAKE (1) TABLET TWICE A DAY WITH MEALS (BREAKFAST AND SUPPER), Disp: 30 tablet, Rfl: 1 .  omeprazole (PRILOSEC) 20 MG capsule, Take 1 capsule (20 mg total) by mouth 2 (two) times daily before a meal., Disp: 60 capsule, Rfl: 5 .   predniSONE (DELTASONE) 10 MG tablet, Take 4 tabs for 2 days, then 3 tabs for 2 days, 2 tabs for 2 days, then 1 tab for 2 days, then stop., Disp: 20 tablet, Rfl: 0 .  tiotropium (SPIRIVA HANDIHALER) 18 MCG inhalation capsule, Place 1 capsule (18 mcg total) into inhaler and inhale daily., Disp: 30 capsule, Rfl: 12 .  traZODone (DESYREL) 100 MG tablet, Take 200 mg by mouth at bedtime. , Disp: , Rfl:   Review of Systems  Dyspnea on exertion, cough productive of white sputum, occasional chest pain. No palpitations She has nasal congestion, sneezing She complains of joint pain and stiffness. No nausea vomiting diarrhea constipation. All other review of systems are negative     Objective:   Physical Exam Blood pressure 122/82, pulse 91, height 4\' 11"  (1.499 m), weight 144 lb (65.318 kg), SpO2 94 %.  Gen.: No apparent distress Neuro: No gross focal deficits. Neck: No JVD, lymphadenopathy, thyromegaly. RS: Clear. No wheeze or crackles. CVS: S1-S2 heard, no murmurs rubs gallops. Abdomen: Soft, positive bowel sounds. Extremities: No edema.    Assessment & Plan:   #1 COPD Patient had a recent COPD exacerbation. She is doing better after prednisone. She continues to be on [place]. She did not desat on exertion in the office today. The results of the overnight oximetry still pending and will be followed up. PFTs show worsening of her lung function compared to 2008 with FEV1 of 45%.  It would be reasonable to go ahead with the surgery now as she appears under better control. She is at a higher risk given her severe COPD but this would not be a contraindication to surgery. Recommend early and ambulation, incentive spirometry and continued use of her inhalers in the perioperative period.  #2 Smoking I have encouraged her to quit smoking before the surgery. She'll continue to use the nicotine patches and is not interested in any other additional smoking cessation help.  #3 nocturnal  hypoxemia. Sleep study from 2013 noted. It shows desaturations at night but no OSA. She may need nocturnal oxygen. Overnight oximetry is pending.  Plan: - Continue Dulera and Spiriva. - Follow results of overnight oximetry.  Return to clinic in 6 months.  Marshell Garfinkel MD [facility] Pager [phone] If no answer or after 3pm call: [phone] 08/21/2015, 3:40 PM

## 2015-08-28 ENCOUNTER — Other Ambulatory Visit: Payer: Self-pay | Admitting: Orthopedic Surgery

## 2015-08-29 ENCOUNTER — Telehealth: Payer: Self-pay | Admitting: Family Medicine

## 2015-09-01 ENCOUNTER — Telehealth: Payer: Self-pay | Admitting: Pulmonary Disease

## 2015-09-01 ENCOUNTER — Ambulatory Visit: Payer: Medicaid Other | Admitting: Family Medicine

## 2015-09-01 DIAGNOSIS — J441 Chronic obstructive pulmonary disease with (acute) exacerbation: Secondary | ICD-10-CM

## 2015-09-01 NOTE — Addendum Note (Signed)
Addended by: Parke Poisson E on: 09/01/2015 03:20 PM   Modules accepted: Orders

## 2015-09-01 NOTE — Telephone Encounter (Signed)
Spoke with patient and let her know that Dr. Berenice Primas can access thru computer

## 2015-09-01 NOTE — Telephone Encounter (Signed)
11.2.16 ONO on room air Reviewed by PM: positive for desats, begin O2 2lpm at bedtime.  Called spoke with patient and discussed above results her.  Pt voiced her understanding and denied any questions regarding order.  She would like to know if she may proceed with her shoulder surgery on 11.23.16.  Advised pt will discuss with PM and call her back today.  Order placed for nocturnal O2.  Pt is not already established with a DME.  Discussed with PM: okay for shoulder surgery.  Nothing further needed; will sign off. ONO sent for scan.

## 2015-09-02 ENCOUNTER — Encounter: Payer: Self-pay | Admitting: Family Medicine

## 2015-09-02 ENCOUNTER — Ambulatory Visit (INDEPENDENT_AMBULATORY_CARE_PROVIDER_SITE_OTHER): Payer: Medicaid Other | Admitting: Family Medicine

## 2015-09-02 ENCOUNTER — Telehealth: Payer: Self-pay | Admitting: Pulmonary Disease

## 2015-09-02 VITALS — BP 137/98 | HR 109 | Temp 97.2°F | Ht 59.0 in | Wt 140.8 lb

## 2015-09-02 DIAGNOSIS — F32A Depression, unspecified: Secondary | ICD-10-CM

## 2015-09-02 DIAGNOSIS — F329 Major depressive disorder, single episode, unspecified: Secondary | ICD-10-CM

## 2015-09-02 DIAGNOSIS — G894 Chronic pain syndrome: Secondary | ICD-10-CM | POA: Insufficient documentation

## 2015-09-02 DIAGNOSIS — M25512 Pain in left shoulder: Secondary | ICD-10-CM | POA: Diagnosis not present

## 2015-09-02 MED ORDER — DULOXETINE HCL 60 MG PO CPEP: ORAL_CAPSULE | ORAL | Status: DC

## 2015-09-02 NOTE — Progress Notes (Signed)
   HPI  Patient presents today here for f/u of L shoulder pain  Reports 20 + years of L shoulder pain and frequent dislocation.  She has an orthopedic surgeon giving her hydrocodone but states it s not enough, using 2 5 mg hydrocodone daily.  No new injury Has decreased strength in L arn, difficulty using it in usual tasks like pouring coffee.  No fever, chills, sweats   PMH: Smoking status noted ROS: Per HPI  Objective: BP 137/98 mmHg  Pulse 109  Temp(Src) 97.2 F (36.2 C) (Oral)  Ht 4\' 11"  (1.499 m)  Wt 140 lb 12.8 oz (63.866 kg)  BMI 28.42 kg/m2 Gen: NAD, alert, cooperative with exam HEENT: NCAT CV: RRR, good S1/S2, no murmur Resp: CTABL, no wheezes, non-labored Ext: No edema, warm Neuro: Alert and oriented, No gross deficits  Assessment and plan:  # Chronic pain, L shoulder pain Refer to pain management She continues to follow with ortho which I encouraged Continue cymbalta - increase to 60 mg daily    Orders Placed This Encounter  Procedures  . Ambulatory referral to Pain Clinic    Referral Priority:  Routine    Referral Type:  Consultation    Referral Reason:  Specialty Services Required    Requested Specialty:  Pain Medicine    Number of Visits Requested:  [place], MD [place] 09/02/2015, 10:05 AM

## 2015-09-02 NOTE — Patient Instructions (Signed)
Great to see you!  Schedule a mammogram  We will arrange a pain clinic appointment for you.

## 2015-09-02 NOTE — Telephone Encounter (Signed)
Call transferred Spoke with Lucia Estelle w/ [facility] - she received the urgent order for new start O2 from the ONO (order sent yesterday 11.14).  Per Lucia Estelle, pt's insurance is requiring OV notes be faxed.  Please fax to her attn @ [phone]  The last 2 ov notes with PM have been printed and faxed to [facility] attn at the fax number listed above. Nothing further needed; will sign off.

## 2015-09-05 ENCOUNTER — Encounter (HOSPITAL_COMMUNITY): Payer: Self-pay

## 2015-09-05 ENCOUNTER — Encounter (HOSPITAL_COMMUNITY)
Admission: RE | Admit: 2015-09-05 | Discharge: 2015-09-05 | Disposition: A | Discharge status: Home / Self Care | Payer: Medicaid Other | Source: Ambulatory Visit | Attending: Orthopedic Surgery | Admitting: Orthopedic Surgery

## 2015-09-05 ENCOUNTER — Encounter (HOSPITAL_COMMUNITY)
Admission: RE | Admit: 2015-09-05 | Discharge: 2015-09-05 | Disposition: A | Discharge status: Home / Self Care | Payer: Medicaid Other | Source: Ambulatory Visit | Attending: Anesthesiology | Admitting: Anesthesiology

## 2015-09-05 DIAGNOSIS — K219 Gastro-esophageal reflux disease without esophagitis: Secondary | ICD-10-CM | POA: Diagnosis not present

## 2015-09-05 DIAGNOSIS — M75101 Unspecified rotator cuff tear or rupture of right shoulder, not specified as traumatic: Secondary | ICD-10-CM | POA: Diagnosis not present

## 2015-09-05 DIAGNOSIS — Z01818 Encounter for other preprocedural examination: Secondary | ICD-10-CM | POA: Diagnosis not present

## 2015-09-05 DIAGNOSIS — G4733 Obstructive sleep apnea (adult) (pediatric): Secondary | ICD-10-CM | POA: Diagnosis not present

## 2015-09-05 DIAGNOSIS — J449 Chronic obstructive pulmonary disease, unspecified: Secondary | ICD-10-CM | POA: Insufficient documentation

## 2015-09-05 DIAGNOSIS — F17200 Nicotine dependence, unspecified, uncomplicated: Secondary | ICD-10-CM | POA: Insufficient documentation

## 2015-09-05 DIAGNOSIS — Z79899 Other long term (current) drug therapy: Secondary | ICD-10-CM | POA: Diagnosis not present

## 2015-09-05 DIAGNOSIS — I10 Essential (primary) hypertension: Secondary | ICD-10-CM | POA: Insufficient documentation

## 2015-09-05 DIAGNOSIS — Z01812 Encounter for preprocedural laboratory examination: Secondary | ICD-10-CM | POA: Diagnosis not present

## 2015-09-05 HISTORY — DX: Gastro-esophageal reflux disease without esophagitis: K21.9

## 2015-09-05 HISTORY — DX: Pneumonia, unspecified organism: J18.9

## 2015-09-05 HISTORY — DX: Reserved for inherently not codable concepts without codable children: IMO0001

## 2015-09-05 HISTORY — DX: Unspecified osteoarthritis, unspecified site: M19.90

## 2015-09-05 HISTORY — DX: Anxiety disorder, unspecified: F41.9

## 2015-09-05 HISTORY — DX: Unspecified convulsions: R56.9

## 2015-09-05 HISTORY — DX: Sleep apnea, unspecified: G47.30

## 2015-09-05 HISTORY — DX: Myoneural disorder, unspecified: G70.9

## 2015-09-05 LAB — COMPREHENSIVE METABOLIC PANEL
ALK PHOS: 73 U/L (ref 38–126)
ALT: 14 U/L (ref 14–54)
AST: 20 U/L (ref 15–41)
Albumin: 4.1 g/dL (ref 3.5–5.0)
Anion gap: 9 (ref 5–15)
BUN: 15 mg/dL (ref 6–20)
CALCIUM: 9.7 mg/dL (ref 8.9–10.3)
CO2: 25 mmol/L (ref 22–32)
CREATININE: 0.79 mg/dL (ref 0.44–1.00)
Chloride: 104 mmol/L (ref 101–111)
Glucose, Bld: 96 mg/dL (ref 65–99)
Potassium: 4.4 mmol/L (ref 3.5–5.1)
Sodium: 138 mmol/L (ref 135–145)
Total Protein: 6.9 g/dL (ref 6.5–8.1)

## 2015-09-05 LAB — CBC
HCT: 47.7 % — ABNORMAL HIGH (ref 36.0–46.0)
Hemoglobin: 15.8 g/dL — ABNORMAL HIGH (ref 12.0–15.0)
MCH: 30.6 pg (ref 26.0–34.0)
MCHC: 33.1 g/dL (ref 30.0–36.0)
MCV: 92.4 fL (ref 78.0–100.0)
PLATELETS: 272 10*3/uL (ref 150–400)
RBC: 5.16 MIL/uL — AB (ref 3.87–5.11)
RDW: 14.2 % (ref 11.5–15.5)
WBC: 9 10*3/uL (ref 4.0–10.5)

## 2015-09-05 NOTE — Pre-Procedure Instructions (Signed)
AMII AMBROSI  09/05/2015      [facility] [place] [address] [place] [address] Phone: [phone] Fax: [phone]    Your procedure is scheduled on 09/10/2015.  Report to [facility] Admitting at 6:30 A.M.  Call this number if you have problems the morning of surgery:  [phone]   Remember:  Do not eat food or drink liquids after midnight.  On TUESDAY  Take these medicines the morning of surgery with A SIP OF WATER :    use inhalers according to schedule, take Amlodipine, Prilosec, Spirivia and  Pain medicine is OK the a.m. Of surgery                            DO NOT TAKE ANYMORE NAPROXEN or any ASPIRIN or ANY IBUPROFEN, ADVIL, ALEVE or HERBAL MEDICINES    Do not wear jewelry, make-up or nail polish.   Do not wear lotions, powders, or perfumes.     Do not shave 48 hours prior to surgery.     Do not bring valuables to the hospital.   [facility] is not responsible for any belongings or valuables.  Contacts, dentures or bridgework may not be worn into surgery.  Leave your suitcase in the car.  After surgery it may be brought to your room.  For patients admitted to the hospital, discharge time will be determined by your treatment team.  Patients discharged the day of surgery will not be allowed to drive home.   Name and phone number of your driver:   With Son  Special instructions:  Special Instructions: [facility] - Preparing for Surgery  Before surgery, you can play an important role.  Because skin is not sterile, your skin needs to be as free of germs as possible.  You can reduce the number of germs on you skin by washing with CHG (chlorahexidine gluconate) soap before surgery.  CHG is an antiseptic cleaner which kills germs and bonds with the skin to continue killing germs even after washing.  Please DO NOT use if you have an allergy to CHG or antibacterial soaps.  If your skin becomes  reddened/irritated stop using the CHG and inform your nurse when you arrive at Short Stay.  Do not shave (including legs and underarms) for at least 48 hours prior to the first CHG shower.  You may shave your face.  Please follow these instructions carefully:   1.  Shower with CHG Soap the night before surgery and the  morning of Surgery.  2.  If you choose to wash your hair, wash your hair first as usual with your  normal shampoo.  3.  After you shampoo, rinse your hair and body thoroughly to remove the  Shampoo.  4.  Use CHG as you would any other liquid soap.  You can apply chg directly to the skin and wash gently with scrungie or a clean washcloth.  5.  Apply the CHG Soap to your body ONLY FROM THE NECK DOWN.    Do not use on open wounds or open sores.  Avoid contact with your eyes, ears, mouth and genitals (private parts).  Wash genitals (private parts)   with your normal soap.  6.  Wash thoroughly, paying special attention to the area where your surgery will be performed.  7.  Thoroughly rinse your body with warm water from the neck  down.  8.  DO NOT shower/wash with your normal soap after using and rinsing off   the CHG Soap.  9.  Pat yourself dry with a clean towel.            10.  Wear clean pajamas.            11.  Place clean sheets on your bed the night of your first shower and do not sleep with pets.  Day of Surgery  Do not apply any lotions/deodorants the morning of surgery.  Please wear clean clothes to the hospital/surgery center.  Please read over the following fact sheets that you were given. Pain Booklet, Coughing and Deep Breathing and Surgical Site Infection Prevention

## 2015-09-05 NOTE — Progress Notes (Signed)
Pt. Reports that her breathing is at baseline relative to when she had her last pulmonary visit. Call to A. Kabbe,NP, made her aware that pt. Has used recreational drugs in past couple of days. She is schedule to get O2 delivered to her home on 09/08/2015, she thinks.

## 2015-09-08 ENCOUNTER — Other Ambulatory Visit: Payer: Self-pay | Admitting: Orthopedic Surgery

## 2015-09-08 ENCOUNTER — Encounter: Payer: Medicaid Other | Admitting: Family Medicine

## 2015-09-08 ENCOUNTER — Encounter (HOSPITAL_COMMUNITY): Payer: Self-pay

## 2015-09-08 NOTE — Progress Notes (Signed)
Anesthesia Chart Review:  Pt is 54 year old female scheduled for R shoulder arthroscopy, L shoulder injection on 09/10/2015 with Dr. Berenice Primas.   PMH includes:  HTN, severe COPD, OSA, seizures (last in 2012), GERD, polysubstance abuse (last cocaine use 09/03/15), binge alcohol use. Current smoker. BMI 29.   Medications include: albuterol, combivent, amlodipine, atrovent, dulera, prilosec, spiriva  Preoperative labs reviewed.    Chest x-ray 09/05/15 reviewed. No active cardiopulmonary disease  EKG 09/05/15: NSR. Possible Left atrial enlargement.  PFTs 08/21/15:  FVC 2.58 [89%] FEV1 1.03 [45%] F/F 40 TLC 128% DLCO 54%. No broncho-dilator response.  Pulmonologist is Dr. Vaughan Browner, last office visit 08/21/15. Pt having issues with nocturnal hypoexemia, is supposed to be started on home oxygen soon. Dr. Vaughan Browner gives pulmonary clearance for surgery at higher risk given severe COPD. "Recommend early and ambulation, incentive spirometry and continued use of her inhalers in the perioperative period".   Notified Elmyra Ricks in Dr. Berenice Primas' office of pt's recent cocaine use. Will defer decision to drug screen DOS to assigned anesthesiologist.   If no changes, I anticipate pt can proceed with surgery as scheduled.   Willeen Cass, FNP-BC [facility]/Anesthesiology Phone: [phone] 09/08/2015 1:36 PM

## 2015-09-09 MED ORDER — CEFAZOLIN SODIUM-DEXTROSE 2-3 GM-% IV SOLR
2.0000 g | INTRAVENOUS | Status: AC
  Administered 2015-09-10: 2 g via INTRAVENOUS
  Filled 2015-09-09: qty 50

## 2015-09-09 MED ORDER — CHLORHEXIDINE GLUCONATE 4 % EX LIQD: 60.0000 mL | Freq: Once | CUTANEOUS | Status: DC

## 2015-09-10 ENCOUNTER — Ambulatory Visit (HOSPITAL_COMMUNITY): Payer: Medicaid Other | Admitting: Certified Registered Nurse Anesthetist

## 2015-09-10 ENCOUNTER — Ambulatory Visit (HOSPITAL_COMMUNITY): Payer: Medicaid Other | Admitting: Emergency Medicine

## 2015-09-10 ENCOUNTER — Ambulatory Visit (HOSPITAL_COMMUNITY)
Admission: RE | Admit: 2015-09-10 | Discharge: 2015-09-10 | Disposition: A | Discharge status: Home / Self Care | Payer: Medicaid Other | Source: Ambulatory Visit | Attending: Orthopedic Surgery | Admitting: Orthopedic Surgery

## 2015-09-10 ENCOUNTER — Encounter (HOSPITAL_COMMUNITY)
Admission: RE | Disposition: A | Discharge status: Home / Self Care | Payer: Self-pay | Source: Ambulatory Visit | Attending: Orthopedic Surgery

## 2015-09-10 ENCOUNTER — Encounter (HOSPITAL_COMMUNITY): Payer: Self-pay | Admitting: *Deleted

## 2015-09-10 DIAGNOSIS — G4730 Sleep apnea, unspecified: Secondary | ICD-10-CM | POA: Insufficient documentation

## 2015-09-10 DIAGNOSIS — F419 Anxiety disorder, unspecified: Secondary | ICD-10-CM | POA: Insufficient documentation

## 2015-09-10 DIAGNOSIS — F1490 Cocaine use, unspecified, uncomplicated: Secondary | ICD-10-CM | POA: Insufficient documentation

## 2015-09-10 DIAGNOSIS — M1389 Other specified arthritis, multiple sites: Secondary | ICD-10-CM | POA: Diagnosis not present

## 2015-09-10 DIAGNOSIS — M19012 Primary osteoarthritis, left shoulder: Secondary | ICD-10-CM | POA: Diagnosis not present

## 2015-09-10 DIAGNOSIS — M75101 Unspecified rotator cuff tear or rupture of right shoulder, not specified as traumatic: Secondary | ICD-10-CM

## 2015-09-10 DIAGNOSIS — M25511 Pain in right shoulder: Secondary | ICD-10-CM | POA: Insufficient documentation

## 2015-09-10 DIAGNOSIS — F17210 Nicotine dependence, cigarettes, uncomplicated: Secondary | ICD-10-CM | POA: Insufficient documentation

## 2015-09-10 DIAGNOSIS — F329 Major depressive disorder, single episode, unspecified: Secondary | ICD-10-CM | POA: Insufficient documentation

## 2015-09-10 DIAGNOSIS — F1290 Cannabis use, unspecified, uncomplicated: Secondary | ICD-10-CM | POA: Insufficient documentation

## 2015-09-10 DIAGNOSIS — M12811 Other specific arthropathies, not elsewhere classified, right shoulder: Secondary | ICD-10-CM

## 2015-09-10 DIAGNOSIS — M25512 Pain in left shoulder: Secondary | ICD-10-CM | POA: Insufficient documentation

## 2015-09-10 DIAGNOSIS — J449 Chronic obstructive pulmonary disease, unspecified: Secondary | ICD-10-CM | POA: Diagnosis not present

## 2015-09-10 DIAGNOSIS — I10 Essential (primary) hypertension: Secondary | ICD-10-CM | POA: Diagnosis not present

## 2015-09-10 DIAGNOSIS — K219 Gastro-esophageal reflux disease without esophagitis: Secondary | ICD-10-CM | POA: Diagnosis not present

## 2015-09-10 DIAGNOSIS — M19011 Primary osteoarthritis, right shoulder: Secondary | ICD-10-CM | POA: Insufficient documentation

## 2015-09-10 DIAGNOSIS — M8580 Other specified disorders of bone density and structure, unspecified site: Secondary | ICD-10-CM | POA: Insufficient documentation

## 2015-09-10 HISTORY — PX: SHOULDER ARTHROSCOPY: SHX128

## 2015-09-10 HISTORY — PX: SHOULDER INJECTION: SHX5048

## 2015-09-10 SURGERY — ARTHROSCOPY, SHOULDER
Anesthesia: Regional | Site: Shoulder | Laterality: Right

## 2015-09-10 MED ORDER — ARTIFICIAL TEARS OP OINT
TOPICAL_OINTMENT | OPHTHALMIC | Status: AC
  Filled 2015-09-10: qty 3.5

## 2015-09-10 MED ORDER — ROCURONIUM BROMIDE 50 MG/5ML IV SOLN
INTRAVENOUS | Status: AC
  Filled 2015-09-10: qty 1

## 2015-09-10 MED ORDER — MIDAZOLAM HCL 2 MG/2ML IJ SOLN
INTRAMUSCULAR | Status: AC
  Filled 2015-09-10: qty 2

## 2015-09-10 MED ORDER — ONDANSETRON HCL 4 MG/2ML IJ SOLN
INTRAMUSCULAR | Status: AC
  Filled 2015-09-10: qty 2

## 2015-09-10 MED ORDER — IPRATROPIUM-ALBUTEROL 0.5-2.5 (3) MG/3ML IN SOLN
3.0000 mL | RESPIRATORY_TRACT | Status: DC
  Administered 2015-09-10: 3 mL via RESPIRATORY_TRACT

## 2015-09-10 MED ORDER — BUPIVACAINE HCL (PF) 0.25 % IJ SOLN
INTRAMUSCULAR | Status: DC | PRN
  Administered 2015-09-10: 20 mL via INTRA_ARTICULAR

## 2015-09-10 MED ORDER — FENTANYL CITRATE (PF) 250 MCG/5ML IJ SOLN
INTRAMUSCULAR | Status: AC
  Filled 2015-09-10: qty 5

## 2015-09-10 MED ORDER — ROCURONIUM BROMIDE 100 MG/10ML IV SOLN
INTRAVENOUS | Status: DC | PRN
  Administered 2015-09-10: 40 mg via INTRAVENOUS

## 2015-09-10 MED ORDER — FENTANYL CITRATE (PF) 100 MCG/2ML IJ SOLN: 25.0000 ug | INTRAMUSCULAR | Status: DC | PRN

## 2015-09-10 MED ORDER — GLYCOPYRROLATE 0.2 MG/ML IJ SOLN
INTRAMUSCULAR | Status: DC | PRN
  Administered 2015-09-10 (×2): 0.1 mg via INTRAVENOUS
  Administered 2015-09-10: 0.4 mg via INTRAVENOUS

## 2015-09-10 MED ORDER — BUPIVACAINE HCL (PF) 0.25 % IJ SOLN
INTRAMUSCULAR | Status: AC
  Filled 2015-09-10: qty 30

## 2015-09-10 MED ORDER — GLYCOPYRROLATE 0.2 MG/ML IJ SOLN
INTRAMUSCULAR | Status: AC
  Filled 2015-09-10: qty 1

## 2015-09-10 MED ORDER — PROPOFOL 10 MG/ML IV BOLUS
INTRAVENOUS | Status: DC | PRN
  Administered 2015-09-10: 110 mg via INTRAVENOUS
  Administered 2015-09-10: 40 mg via INTRAVENOUS
  Administered 2015-09-10: 50 mg via INTRAVENOUS

## 2015-09-10 MED ORDER — NEOSTIGMINE METHYLSULFATE 10 MG/10ML IV SOLN
INTRAVENOUS | Status: AC
  Filled 2015-09-10: qty 1

## 2015-09-10 MED ORDER — OXYCODONE-ACETAMINOPHEN 5-325 MG PO TABS: 1.0000 | ORAL_TABLET | Freq: Four times a day (QID) | ORAL | Status: DC | PRN

## 2015-09-10 MED ORDER — OXYCODONE HCL 5 MG PO TABS
5.0000 mg | ORAL_TABLET | Freq: Once | ORAL | Status: DC | PRN
Start: 2015-09-10 — End: 2015-09-10

## 2015-09-10 MED ORDER — MIDAZOLAM HCL 2 MG/2ML IJ SOLN
INTRAMUSCULAR | Status: DC | PRN
  Administered 2015-09-10: 2 mg via INTRAVENOUS

## 2015-09-10 MED ORDER — LIDOCAINE HCL (CARDIAC) 20 MG/ML IV SOLN
INTRAVENOUS | Status: DC | PRN
  Administered 2015-09-10: 60 mg via INTRAVENOUS

## 2015-09-10 MED ORDER — DEXAMETHASONE SODIUM PHOSPHATE 10 MG/ML IJ SOLN
INTRAMUSCULAR | Status: DC | PRN
  Administered 2015-09-10: 10 mg via INTRAVENOUS

## 2015-09-10 MED ORDER — NEOSTIGMINE METHYLSULFATE 10 MG/10ML IV SOLN
INTRAVENOUS | Status: DC | PRN
  Administered 2015-09-10: 3 mg via INTRAVENOUS

## 2015-09-10 MED ORDER — FENTANYL CITRATE (PF) 100 MCG/2ML IJ SOLN
INTRAMUSCULAR | Status: AC
  Filled 2015-09-10: qty 2

## 2015-09-10 MED ORDER — PROPOFOL 10 MG/ML IV BOLUS
INTRAVENOUS | Status: AC
  Filled 2015-09-10: qty 20

## 2015-09-10 MED ORDER — ACETAMINOPHEN 325 MG PO TABS: 325.0000 mg | ORAL_TABLET | ORAL | Status: DC | PRN

## 2015-09-10 MED ORDER — PHENYLEPHRINE HCL 10 MG/ML IJ SOLN
INTRAMUSCULAR | Status: DC | PRN
  Administered 2015-09-10: 120 ug via INTRAVENOUS
  Administered 2015-09-10: 80 ug via INTRAVENOUS
  Administered 2015-09-10: 120 ug via INTRAVENOUS
  Administered 2015-09-10: 80 ug via INTRAVENOUS
  Administered 2015-09-10: 120 ug via INTRAVENOUS

## 2015-09-10 MED ORDER — LACTATED RINGERS IV SOLN
INTRAVENOUS | Status: DC | PRN
  Administered 2015-09-10: 09:00:00 via INTRAVENOUS

## 2015-09-10 MED ORDER — FENTANYL CITRATE (PF) 250 MCG/5ML IJ SOLN
INTRAMUSCULAR | Status: DC | PRN
  Administered 2015-09-10 (×3): 50 ug via INTRAVENOUS

## 2015-09-10 MED ORDER — ACETAMINOPHEN 160 MG/5ML PO SOLN
325.0000 mg | ORAL | Status: DC | PRN
  Filled 2015-09-10: qty 20.3

## 2015-09-10 MED ORDER — ARTIFICIAL TEARS OP OINT
TOPICAL_OINTMENT | OPHTHALMIC | Status: DC | PRN
  Administered 2015-09-10: 1 via OPHTHALMIC

## 2015-09-10 MED ORDER — METHYLPREDNISOLONE ACETATE 80 MG/ML IJ SUSP
80.0000 mg | Freq: Once | INTRAMUSCULAR | Status: AC
  Administered 2015-09-10: 120 mg via INTRA_ARTICULAR
  Filled 2015-09-10: qty 1

## 2015-09-10 MED ORDER — SODIUM CHLORIDE 0.9 % IR SOLN
Status: DC | PRN
  Administered 2015-09-10: 1000 mL

## 2015-09-10 MED ORDER — IPRATROPIUM-ALBUTEROL 0.5-2.5 (3) MG/3ML IN SOLN
RESPIRATORY_TRACT | Status: AC
  Filled 2015-09-10: qty 3

## 2015-09-10 MED ORDER — OXYCODONE HCL 5 MG/5ML PO SOLN: 5.0000 mg | Freq: Once | ORAL | Status: DC | PRN

## 2015-09-10 MED ORDER — BUPIVACAINE-EPINEPHRINE (PF) 0.5% -1:200000 IJ SOLN
INTRAMUSCULAR | Status: DC | PRN
  Administered 2015-09-10: 20 mL via PERINEURAL

## 2015-09-10 MED ORDER — METOPROLOL TARTRATE 1 MG/ML IV SOLN
INTRAVENOUS | Status: DC | PRN
  Administered 2015-09-10: 1 mg via INTRAVENOUS

## 2015-09-10 MED ORDER — ONDANSETRON HCL 4 MG/2ML IJ SOLN
INTRAMUSCULAR | Status: DC | PRN
  Administered 2015-09-10: 4 mg via INTRAVENOUS

## 2015-09-10 MED ORDER — EPINEPHRINE HCL 1 MG/ML IJ SOLN
INTRAMUSCULAR | Status: AC
  Filled 2015-09-10: qty 1

## 2015-09-10 MED ORDER — DEXAMETHASONE SODIUM PHOSPHATE 10 MG/ML IJ SOLN
INTRAMUSCULAR | Status: AC
  Filled 2015-09-10: qty 1

## 2015-09-10 MED ORDER — LIDOCAINE HCL (CARDIAC) 20 MG/ML IV SOLN
INTRAVENOUS | Status: AC
  Filled 2015-09-10: qty 5

## 2015-09-10 SURGICAL SUPPLY — 53 items
BENZOIN TINCTURE PRP APPL 2/3 (GAUZE/BANDAGES/DRESSINGS) ×4 IMPLANT
BLADE CUTTER GATOR 3.5 (BLADE) ×4 IMPLANT
BLADE GREAT WHITE 4.2 (BLADE) ×3 IMPLANT
BLADE GREAT WHITE 4.2MM (BLADE) ×1
BLADE SURG 11 STRL SS (BLADE) ×4 IMPLANT
BOOTCOVER CLEANROOM LRG (PROTECTIVE WEAR) ×16 IMPLANT
BUR OVAL 4.0 (BURR) ×4 IMPLANT
BUR VERTEX HOODED 4.5 (BURR) IMPLANT
CLOSURE WOUND 1/2 X4 (GAUZE/BANDAGES/DRESSINGS) ×1
COVER SURGICAL LIGHT HANDLE (MISCELLANEOUS) ×4 IMPLANT
DRAPE IMP U-DRAPE 54X76 (DRAPES) ×8 IMPLANT
DRAPE STERI 35X30 U-POUCH (DRAPES) ×4 IMPLANT
DRAPE SURG 17X23 STRL (DRAPES) ×4 IMPLANT
DRAPE U-SHAPE 47X51 STRL (DRAPES) ×12 IMPLANT
DRSG PAD ABDOMINAL 8X10 ST (GAUZE/BANDAGES/DRESSINGS) ×12 IMPLANT
DURAPREP 26ML APPLICATOR (WOUND CARE) ×4 IMPLANT
ELECT MENISCUS 165MM 90D (ELECTRODE) IMPLANT
ELECT REM PT RETURN 9FT ADLT (ELECTROSURGICAL)
ELECTRODE REM PT RTRN 9FT ADLT (ELECTROSURGICAL) IMPLANT
FILTER STRAW FLUID ASPIR (MISCELLANEOUS) ×4 IMPLANT
GAUZE SPONGE 4X4 12PLY STRL (GAUZE/BANDAGES/DRESSINGS) ×4 IMPLANT
GAUZE XEROFORM 1X8 LF (GAUZE/BANDAGES/DRESSINGS) ×4 IMPLANT
GLOVE BIOGEL PI IND STRL 8 (GLOVE) ×4 IMPLANT
GLOVE BIOGEL PI INDICATOR 8 (GLOVE) ×4
GLOVE ECLIPSE 7.5 STRL STRAW (GLOVE) ×8 IMPLANT
GOWN STRL REUS W/ TWL LRG LVL3 (GOWN DISPOSABLE) ×2 IMPLANT
GOWN STRL REUS W/ TWL XL LVL3 (GOWN DISPOSABLE) ×2 IMPLANT
GOWN STRL REUS W/TWL LRG LVL3 (GOWN DISPOSABLE) ×2
GOWN STRL REUS W/TWL XL LVL3 (GOWN DISPOSABLE) ×2
KIT BASIN OR (CUSTOM PROCEDURE TRAY) ×4 IMPLANT
KIT ROOM TURNOVER OR (KITS) ×4 IMPLANT
MANIFOLD NEPTUNE II (INSTRUMENTS) ×4 IMPLANT
NEEDLE 18GX1X1/2 (RX/OR ONLY) (NEEDLE) ×4 IMPLANT
NEEDLE 22X1 1/2 (OR ONLY) (NEEDLE) ×4 IMPLANT
NEEDLE SPNL 18GX3.5 QUINCKE PK (NEEDLE) ×4 IMPLANT
PACK SHOULDER (CUSTOM PROCEDURE TRAY) ×4 IMPLANT
PACK UNIVERSAL I (CUSTOM PROCEDURE TRAY) ×4 IMPLANT
PAD ARMBOARD 7.5X6 YLW CONV (MISCELLANEOUS) ×8 IMPLANT
PENCIL BUTTON HOLSTER BLD 10FT (ELECTRODE) IMPLANT
SET ARTHROSCOPY TUBING (MISCELLANEOUS) ×2
SET ARTHROSCOPY TUBING LN (MISCELLANEOUS) ×2 IMPLANT
SLING ARM FOAM STRAP LRG (SOFTGOODS) ×4 IMPLANT
SPONGE LAP 4X18 X RAY DECT (DISPOSABLE) ×4 IMPLANT
STRIP CLOSURE SKIN 1/2X4 (GAUZE/BANDAGES/DRESSINGS) ×3 IMPLANT
SUT ETHILON 4 0 PS 2 18 (SUTURE) ×4 IMPLANT
SYR 5ML LL (SYRINGE) ×4 IMPLANT
SYR CONTROL 10ML LL (SYRINGE) ×4 IMPLANT
TOWEL OR 17X24 6PK STRL BLUE (TOWEL DISPOSABLE) ×4 IMPLANT
TOWEL OR 17X26 10 PK STRL BLUE (TOWEL DISPOSABLE) ×4 IMPLANT
TUBE CONNECTING 12'X1/4 (SUCTIONS) ×1
TUBE CONNECTING 12X1/4 (SUCTIONS) ×3 IMPLANT
WAND HAND CNTRL MULTIVAC 90 (MISCELLANEOUS) ×4 IMPLANT
WATER STERILE IRR 1000ML POUR (IV SOLUTION) ×4 IMPLANT

## 2015-09-10 NOTE — Discharge Instructions (Signed)
Wear sling. You may remove it for shoulder range of motion. Change the dressing in 3 days. Keep incisions dry.

## 2015-09-10 NOTE — Op Note (Signed)
Brenda Duran, Brenda Duran                 ACCOUNT NO.:  [id number]  MEDICAL RECORD NO.:  [id number]  LOCATION:  [facility]                         FACILITY:  [facility]  PHYSICIAN:  Alta Corning, Brenda.D.   DATE OF BIRTH:  12-01-1960  DATE OF PROCEDURE:  09/10/2015 DATE OF DISCHARGE:  09/10/2015                              OPERATIVE REPORT   PREOPERATIVE DIAGNOSES:  Severe bilateral shoulder pain, right greater than left.  She is status post rotator cuff repair bilaterally with ultimately failed rotator cuff repair and significant degenerative change.  POSTOPERATIVE DIAGNOSES:  Severe bilateral shoulder pain, right greater than left.  She is status post rotator cuff repair bilaterally with ultimately failed rotator cuff repair and significant degenerative change.  PROCEDURE: 1. Arthroscopic debridement of glenoid articular cartilage, remnants     of rotator cuff, and bursal inflammation; right. 2. Arthroscopic distal clavicle resection, right. 3. Left subacromial injection with 8 mL Marcaine and 2 mL 80 mg/mL     Depo-Medrol.  SURGEON:  Alta Corning, Brenda.D.  ASSISTANT:  Gary Fleet, P.A.  ANESTHESIA:  General.  HISTORY:  Brenda Duran is a 54 year old female with a long history of significant complaints of bilateral shoulder pain.  She had previously undergone rotator cuff repairs and had done well initially with it but ultimately went on to have worsening pain and weakness.  X-ray showed high-riding humeral head bilaterally.  Left side showed severe arthritic change.  Right side showed no dramatic narrowing on the axillary view. After failure of conservative care and significant long-term pain, she was taken to the operating room for debridement in the right shoulder and injection in the left subacromial space.  DESCRIPTION OF PROCEDURE:  The patient was taken to the operating room, and after adequate anesthesia was obtained with general anesthetic, the patient was placed supine on the  operating table in the beach chair position.  All bony prominences were well padded.  Attention was then turned to the right shoulder after routine prep and drape.  Arthroscopic intervention was performed.  She was noted to have a massive rotator cuff tear, there really were not hardly any fibers identifiable on the rotator cuff, even back on the glenoid margin.  Laterally, we did find remnant of rotator cuff.  We debrided this thoroughly.  The glenoid unfortunately showed fairly dramatic articular cartilage where this was probably over the posterior two-thirds of the glenoid.  The transitional zones were debrided.  There was some labral fray which was debrided.  At this point, we looked up at the acromioplasty, there was really nothing much left to take near the acromion.  The distal clavicle did have one area of significant prominence and the distal clavicle resection was performed opening up this space further.  At this point, the shoulder was copiously and thoroughly irrigated, suctioned dry, and the portals were closed with single nylon interrupted stitches.  Sterile compressive dressing was applied, and attention was then turned towards the left shoulder where she was injected in the subacromial space with 8 mL Marcaine and 2 mL of 80 mg/mL Depo- Medrol under sterile technique.  At this point, the patient was taken to recovery room, she was  noted to be in satisfactory condition, with the estimated blood loss for procedure being minimal.     Alta Corning, Brenda.D.     [name]  D:  09/10/2015  T:  09/10/2015  Job:  [id number]  cc:   Alta Corning, Brenda.D.

## 2015-09-10 NOTE — Anesthesia Preprocedure Evaluation (Signed)
Anesthesia Evaluation  Patient identified by MRN, date of birth, ID band Patient awake    Reviewed: Allergy & Precautions, NPO status , Patient's Chart, lab work & pertinent test results  History of Anesthesia Complications Negative for: history of anesthetic complications  Airway Mallampati: II  TM Distance: >3 FB Neck ROM: Full    Dental  (+) Edentulous Upper, Edentulous Lower   Pulmonary shortness of breath, at rest and Long-Term Oxygen Therapy, sleep apnea , COPD,  COPD inhaler, Current Smoker,     + decreased breath sounds      Cardiovascular hypertension, Pt. on medications  Rhythm:Regular Rate:Tachycardia     Neuro/Psych Seizures -, Well Controlled,  PSYCHIATRIC DISORDERS Anxiety Depression  Neuromuscular disease    GI/Hepatic Neg liver ROS, GERD  Medicated and Controlled,  Endo/Other  negative endocrine ROS  Renal/GU negative Renal ROS     Musculoskeletal  (+) Arthritis ,   Abdominal   Peds  Hematology negative hematology ROS (+)   Anesthesia Other Findings   Reproductive/Obstetrics                             Anesthesia Physical Anesthesia Plan  ASA: III  Anesthesia Plan: General and Regional   Post-op Pain Management:    Induction: Intravenous  Airway Management Planned: Oral ETT  Additional Equipment: None  Intra-op Plan:   Post-operative Plan: Extubation in OR  Informed Consent: I have reviewed the patients History and Physical, chart, labs and discussed the procedure including the risks, benefits and alternatives for the proposed anesthesia with the patient or authorized representative who has indicated his/her understanding and acceptance.   Dental advisory given  Plan Discussed with: CRNA and Surgeon  Anesthesia Plan Comments:         Anesthesia Quick Evaluation

## 2015-09-10 NOTE — Anesthesia Procedure Notes (Addendum)
Anesthesia Regional Block:  Interscalene brachial plexus block  Pre-Anesthetic Checklist: ,, timeout performed, Correct Patient, Correct Site, Correct Laterality, Correct Procedure, Correct Position, site marked, Risks and benefits discussed,  Surgical consent,  Pre-op evaluation,  At surgeon's request and post-op pain management  Laterality: Upper and Right  Prep: chloraprep       Needles:  Injection technique: Single-shot  Needle Type: Echogenic Stimulator Needle          Additional Needles:  Procedures: ultrasound guided (picture in chart) and nerve stimulator Interscalene brachial plexus block  Nerve Stimulator or Paresthesia:  Response: deltoid, 0.5 mA,   Additional Responses:   Narrative:  Injection made incrementally with aspirations every 5 mL.  Performed by: Personally  Anesthesiologist: Chrishonda Hesch  Additional Notes: H+P and labs reviewed, risks and benefits discussed with patient, procedure tolerated well without complications      

## 2015-09-10 NOTE — Anesthesia Postprocedure Evaluation (Signed)
Anesthesia Post Note  Patient: Brenda Duran  Procedure(s) Performed: Procedure(s) (LRB): RIGHT SHOULDER ARTHROSCOPY  (Right) LEFT SHOULDER INJECTION (Left)  Patient location during evaluation: PACU Anesthesia Type: Regional and General Level of consciousness: awake and alert Pain management: pain level controlled Vital Signs Assessment: post-procedure vital signs reviewed and stable Respiratory status: patient connected to nasal cannula oxygen, respiratory function stable and spontaneous breathing Cardiovascular status: stable Postop Assessment: No signs of nausea or vomiting Anesthetic complications: no    Last Vitals:  Filed Vitals:   09/10/15 1145 09/10/15 1201  BP:  122/88  Pulse: 92 87  Temp:    Resp: 16 14    Last Pain:  Filed Vitals:   09/10/15 1212  PainSc: 0-No pain                 Rawlin Reaume

## 2015-09-10 NOTE — Transfer of Care (Signed)
Immediate Anesthesia Transfer of Care Note  Patient: Brenda Duran  Procedure(s) Performed: Procedure(s): RIGHT SHOULDER ARTHROSCOPY  (Right) LEFT SHOULDER INJECTION (Left)  Patient Location: PACU  Anesthesia Type:General and Regional  Level of Consciousness: alert , sedated and patient cooperative  Airway & Oxygen Therapy: Patient Spontanous Breathing and Patient connected to face mask oxygen  Post-op Assessment: Report given to RN, Post -op Vital signs reviewed and stable and Patient moving all extremities X 4  Post vital signs: Reviewed and stable  Last Vitals:  Filed Vitals:   09/10/15 0700  BP: 136/88  Pulse: 91  Temp: 36.3 C  Resp: 20    Complications: No apparent anesthesia complications

## 2015-09-10 NOTE — H&P (Signed)
PREOPERATIVE H&P  Chief Complaint: bilateral shoulder pain  HPI: Brenda Duran is a 54 y.o. female who presents for evaluation of bilateral shoulder pain. It has been present for many months and has been worsening. She has failed conservative measures. Pain is rated as severe.the patient has had previous rotator cuff surgery on both shoulders.  Both shoulders and ultimately gone on to fail this surgery.  She now has right shoulder pain with high riding humeral head and suspected arthropathy but no x-ray evidence of this.  Left shoulder has significant x-ray evidence of rotator cuff arthropathy.  Past Medical History  Diagnosis Date  . Back pain   . Depression   . Hypertension   . COPD (chronic obstructive pulmonary disease) ([place])   . Osteopenia   . Sleep apnea 2013    unsure of results  . Shortness of breath dyspnea   . Pneumonia     [place]- 01/2015  . GERD (gastroesophageal reflux disease)   . Seizures ([place])     6 seizures since 1995, last one 2012, seen in [place], stopeed medicine 3-4 yrs. ago, no seizure since   . 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 Anxiety   . Arthritis     "head to toes"    Past Surgical History  Procedure Laterality Date  . Foot surgery    . Knee surgery    . Shoulder surgery    . Ankle surgery    . Breast surgery Left 2012    lumpectomy  . Tonsillectomy    . Dilation and curettage of uterus     Social History   Social History  . Marital Status: Single    Spouse Name: N/A  . Number of Children: N/A  . Years of Education: N/A   Social History Main Topics  . Smoking status: Current Every Day Smoker -- 0.50 packs/day for 35 years    Types: Cigarettes  . Smokeless tobacco: Never Used  . Alcohol Use: 7.2 oz/week    12 Cans of beer per week     Comment: occ, 4-6 beeers at one time, ususally once per week   . Drug Use: Yes     Comment: marijuana, crack cocaine- (last use on marijuana - 09/04/2015), last cocaine use  09/03/2015  . Sexual Activity: Not Asked   Other Topics Concern  . None   Social History Narrative   Lives with son   Disabled, mostly restaurant work   Family History  Problem Relation Age of Onset  . Cancer Mother     leukemia  . Cancer Father     lung  . Colon cancer Neg Hx   . Esophageal cancer Neg Hx   . Rectal cancer Neg Hx   . Stomach cancer Neg Hx    No Known Allergies Prior to Admission medications   Medication Sig Start Date End Date Taking? Authorizing Provider  albuterol (PROVENTIL) (2.5 MG/3ML) 0.083% nebulizer solution NEBULIZE 1 VIAL EVERY 4 HOURS AS NEEDED Patient taking differently: NEBULIZE 1 VIAL MIXED WITH IPRATROPIUM TWICE DAILY,  USE EITHER ALBUTEROL OR IPRATROPIUM FOR MID DAY DOSE 07/01/15  Yes Tiffany A Gann, PA-C  amLODipine (NORVASC) 5 MG tablet Take 5 mg by mouth daily before breakfast.    Yes Historical Provider, MD  DULoxetine (CYMBALTA) 30 MG capsule Take 60 mg by mouth daily.   Yes Historical Provider, MD  DULoxetine (CYMBALTA) 60 MG capsule 1 capsule daily 09/02/15  Yes Timmothy Euler, MD  HYDROcodone-acetaminophen (NORCO) 5-325 MG per tablet Take 1-2 tablets by mouth every 6 (six) hours as needed for moderate pain. Patient taking differently: Take 0.5 tablets by mouth every 4 (four) hours as needed for moderate pain.  04/15/15  Yes Tiffany A Gann, PA-C  ipratropium (ATROVENT) 0.02 % nebulizer solution NEBULIZE 1 VIAL 4 TIMES A DAY Patient taking differently: NEBULIZE 1 VIAL MIXED WITH ALBUTEROL TWICE DAILY, USE EITHER ALBUTEROL OR IPRATROPIUM FOR MID DAY DOSE 02/26/15  Yes Chipper Herb, MD  methocarbamol (ROBAXIN) 500 MG tablet Take 1-2 tablets (500-1,000 mg total) by mouth 2 (two) times daily. Patient taking differently: Take 500-1,000 mg by mouth daily.  05/28/14  Yes Lysbeth Penner, FNP  mometasone-formoterol (DULERA) 100-5 MCG/ACT AERO Inhale 2 puffs into the lungs 2 (two) times daily. 07/17/15  Yes Timmothy Euler, MD  naproxen (NAPROSYN)  500 MG tablet TAKE (1) TABLET TWICE A DAY WITH MEALS (BREAKFAST AND SUPPER) 08/06/15  Yes Timmothy Euler, MD  nicotine (NICODERM CQ - DOSED IN MG/24 HOURS) 21 mg/24hr patch Place 21 mg onto the skin daily.   Yes Historical Provider, MD  omeprazole (PRILOSEC) 20 MG capsule Take 1 capsule (20 mg total) by mouth 2 (two) times daily before a meal. Patient taking differently: Take 40 mg by mouth daily.  07/02/15  Yes Tiffany A Gann, PA-C  tiotropium (SPIRIVA HANDIHALER) 18 MCG inhalation capsule Place 1 capsule (18 mcg total) into inhaler and inhale daily. Patient taking differently: Place 18 mcg into inhaler and inhale daily before breakfast.  07/02/15  Yes Tiffany A Gann, PA-C  albuterol-ipratropium (COMBIVENT) 18-103 MCG/ACT inhaler Inhale 2 puffs into the lungs every 6 (six) hours as needed. wheezing 05/28/14   Lysbeth Penner, FNP  alendronate (FOSAMAX) 70 MG tablet Take 1 tablet (70 mg total) by mouth every 7 (seven) days. Take with a full glass of water on an empty stomach. 08/14/14   Lysbeth Penner, FNP  naproxen (NAPROSYN) 500 MG tablet TAKE 1 TABLET TWICE DAILY WITH BREAKFAST & SUPPER 09/30/14   Lysbeth Penner, FNP  traZODone (DESYREL) 100 MG tablet Take 100 mg by mouth at bedtime as needed for sleep.     Historical Provider, MD     Positive ROS: none  All other systems have been reviewed and were otherwise negative with the exception of those mentioned in the HPI and as above.  Physical Exam: Filed Vitals:   09/10/15 0700  BP: 136/88  Pulse: 91  Temp: 97.3 F (36.3 C)  Resp: 20    General: Alert, no acute distress Cardiovascular: No pedal edema Respiratory: No cyanosis, no use of accessory musculature GI: No organomegaly, abdomen is soft and non-tender Skin: No lesions in the area of chief complaint Neurologic: Sensation intact distally Psychiatric: Patient is competent for consent with normal mood and affect Lymphatic: No axillary or cervical  lymphadenopathy  MUSCULOSKELETAL: right shoulder has weakness of external rotation.  There is a drop arm test.there's pain with all range of motion.  There is positive impingement findings. Left shoulder: Weakness of external rotation.  Pain with range of motion.  X-ray: Right side shows high riding humeral head with no evidence of glenohumeral arthritis.  Left side shows significant glenohumeral arthritis and high riding humeral head.  Assessment/Plan: Right shoulder rotator cuff tearwith significant pain on range of motion.  Left shoulder pain on all range of motion with obvious rotator cuff tear arthropathy. Plan for Procedure(s): RIGHT SHOULDER ARTHROSCOPY with debridement LEFT SHOULDER INJECTION  The risks benefits and alternatives were discussed with the patient including but not limited to the risks of nonoperative treatment, versus surgical intervention including infection, bleeding, nerve injury, malunion, nonunion, hardware prominence, hardware failure, need for hardware removal, blood clots, cardiopulmonary complications, morbidity, mortality, among others, and they were willing to proceed.  Predicted outcome is good, although there will be at least a six to nine month expected recovery.the patient is fully aware that the right side is likely to go on to need further surgery in the future.  Left side we'll likely go on to need reverse total shoulder arthroplasty.  We are hopeful that we can provide her some reasonable pain relief with rotator cuff debridement on the right side.  Bonni Neuser L, MD 09/10/2015 7:47 AM

## 2015-09-10 NOTE — Brief Op Note (Signed)
09/10/2015  3:06 PM  PATIENT:  Brenda Duran  54 y.o. female  PRE-OPERATIVE DIAGNOSIS:  Right shoulder rotator cuff tear  POST-OPERATIVE DIAGNOSIS:  Right shoulder rotator cuff tear  PROCEDURE:  Procedure(s): RIGHT SHOULDER ARTHROSCOPY  (Right) LEFT SHOULDER INJECTION (Left)  SURGEON:  Surgeon(s) and Role:    * Dorna Leitz, MD - Primary  PHYSICIAN ASSISTANT:   ASSISTANTS: bethune   ANESTHESIA:   general  EBL:  Total I/O In: 800 [I.V.:800] Out: 5 [Blood:5]  BLOOD ADMINISTERED:none  DRAINS: none   LOCAL MEDICATIONS USED:  MARCAINE     SPECIMEN:  No Specimen  DISPOSITION OF SPECIMEN:  N/A  COUNTS:  YES  TOURNIQUET:  * No tourniquets in log *  DICTATION: .Other Dictation: Dictation Number [id number]  PLAN OF CARE: Discharge to home after PACU  PATIENT DISPOSITION:  PACU - hemodynamically stable.   Delay start of Pharmacological VTE agent (>24hrs) due to surgical blood loss or risk of bleeding: no

## 2015-09-15 ENCOUNTER — Ambulatory Visit (INDEPENDENT_AMBULATORY_CARE_PROVIDER_SITE_OTHER): Payer: Medicaid Other | Admitting: Family Medicine

## 2015-09-15 ENCOUNTER — Encounter: Payer: Self-pay | Admitting: Family Medicine

## 2015-09-15 ENCOUNTER — Telehealth: Payer: Self-pay | Admitting: Family Medicine

## 2015-09-15 VITALS — BP 109/73 | HR 90 | Temp 97.6°F | Ht 59.0 in | Wt 142.8 lb

## 2015-09-15 DIAGNOSIS — Z0000 Encounter for general adult medical examination without abnormal findings: Secondary | ICD-10-CM

## 2015-09-15 MED ORDER — ALENDRONATE SODIUM 70 MG PO TABS: 70.0000 mg | ORAL_TABLET | ORAL | Status: AC

## 2015-09-15 NOTE — Telephone Encounter (Signed)
From what I can tell this is the most inexpensive osteoporosis med, I will ask the clinical pharmacist for assistance.   Laroy Apple, MD [facility] 09/15/2015, 3:39 PM

## 2015-09-15 NOTE — Progress Notes (Signed)
   HPI  Patient presents today here today for annual physical.  She's had a recent right shoulder surgery and is doing well from that. She is currently on narcotic pain medications which are helping her with her right shoulder pain as well as her left shoulder pain. She states that she needs a left shoulder replacement which her orthopedic surgeon is trying to arrange for after she heals from the surgery. She would like a pain management referral.  She has started Cymbalta is tolerating easily.  She has no complaints today She is breathing per her baseline status. She denies any chest pain, leg edema, or palpitations.  She states she's been started on nighttime oxygen for oxygen desats while she's sleeping  PMH: Smoking status noted Past medical, surgical, social, family history reviewed and updated in EMR ROS: Per HPI  Objective: BP 109/73 mmHg  Pulse 90  Temp(Src) 97.6 F (36.4 C) (Oral)  Ht 4\' 11"  (1.499 m)  Wt 142 lb 12.8 oz (64.774 kg)  BMI 28.83 kg/m2 Gen: NAD, alert, cooperative with exam HEENT: NCAT, EOMI, PERRL, TMs normal bilaterally CV: RRR, good S1/S2, no murmur Resp: CTABL, no wheezes, non-labored Abd: SNTND, BS present, no guarding or organomegaly Ext: No edema, warm Neuro: Alert and oriented, No gross deficits MSK : R shoulder in a sling  Assessment and plan:  # Annual physical Encouraged mammogram, this is scheduled for early January. Labs up-to-date She is doing well postoperatively  # Drug abuse Cocaine and marijuana use, not a candidate for long-term narcotic use  # COPD Doing well, now on nocturnal oxygen   Laroy Apple, MD [facility] 09/15/2015, 10:57 AM

## 2015-09-15 NOTE — Patient Instructions (Addendum)
Great to see you!  Lets see you back in 3 months  We will follow up about your pain referral  I am glad your surgery went well!  Be sure to get your mammogram

## 2015-09-15 NOTE — Telephone Encounter (Signed)
I spoke with Suanne Marker at [facility] said that patient is not Medicaid and that alendronate is only $3.  I called patient and she was told by anesthesiologist told her to restart alendronate.  She can afford the alendronate - I have called [facility] and they will fill for her - patient aware.

## 2015-09-19 ENCOUNTER — Encounter: Payer: Self-pay | Admitting: Pulmonary Disease

## 2015-09-22 ENCOUNTER — Other Ambulatory Visit: Payer: Self-pay | Admitting: Family Medicine

## 2015-09-22 ENCOUNTER — Other Ambulatory Visit: Payer: Self-pay | Admitting: Physician Assistant

## 2015-09-23 ENCOUNTER — Other Ambulatory Visit: Payer: Self-pay

## 2015-09-23 MED ORDER — NICOTINE 21 MG/24HR TD PT24: 21.0000 mg | MEDICATED_PATCH | Freq: Every day | TRANSDERMAL | Status: DC

## 2015-09-30 ENCOUNTER — Other Ambulatory Visit: Payer: Self-pay | Admitting: Family Medicine

## 2015-09-30 DIAGNOSIS — J411 Mucopurulent chronic bronchitis: Secondary | ICD-10-CM

## 2015-09-30 MED ORDER — AMLODIPINE BESYLATE 5 MG PO TABS: 5.0000 mg | ORAL_TABLET | Freq: Every day | ORAL | Status: DC

## 2015-09-30 MED ORDER — IPRATROPIUM-ALBUTEROL 18-103 MCG/ACT IN AERO: 2.0000 | INHALATION_SPRAY | Freq: Four times a day (QID) | RESPIRATORY_TRACT | Status: DC | PRN

## 2015-09-30 NOTE — Telephone Encounter (Signed)
Called pt back and she needed a refill on albuterol and amlodipine. Rx sent in

## 2015-10-22 ENCOUNTER — Telehealth: Payer: Self-pay | Admitting: Family Medicine

## 2015-10-22 MED ORDER — DICLOFENAC SODIUM 75 MG PO TBEC
75.0000 mg | DELAYED_RELEASE_TABLET | Freq: Two times a day (BID) | ORAL | Status: DC
Start: 2015-10-22 — End: 2015-11-04

## 2015-10-22 NOTE — Telephone Encounter (Signed)
Short course of diclofenac sent.   Normal renal function and no hx of PUD.   Laroy Apple, MD [facility] 10/22/2015, 11:47 AM

## 2015-10-22 NOTE — Telephone Encounter (Signed)
Please advise on medication refill.  

## 2015-10-22 NOTE — Telephone Encounter (Signed)
Aware, script sent in. 

## 2015-10-29 ENCOUNTER — Telehealth: Payer: Self-pay | Admitting: Family Medicine

## 2015-11-04 ENCOUNTER — Telehealth: Payer: Self-pay

## 2015-11-04 MED ORDER — INDOMETHACIN 25 MG PO CAPS: 25.0000 mg | ORAL_CAPSULE | Freq: Three times a day (TID) | ORAL | Status: DC | PRN

## 2015-11-04 NOTE — Telephone Encounter (Signed)
Medicaid non preferred Diclofenac Preferred are Ibuprofen tablet, indomethacine capsule, ketorolac tablet meloxicam tablet naproxen EC tablet, naproxen sodium tablet naprosen tblet, and sulindac tablet

## 2015-11-04 NOTE — Telephone Encounter (Signed)
Patient aware of change in medications due to insurance.

## 2015-11-04 NOTE — Telephone Encounter (Signed)
Changing to indomethacin from Voltaren due to formulary preference.  Note that this request comes 13 days after I prescribed Voltaren for phone call.  Laroy Apple, MD [facility] 11/04/2015, 11:37 AM

## 2015-11-14 ENCOUNTER — Telehealth: Payer: Self-pay | Admitting: Family Medicine

## 2015-11-14 DIAGNOSIS — M19012 Primary osteoarthritis, left shoulder: Secondary | ICD-10-CM

## 2015-11-14 DIAGNOSIS — M19011 Primary osteoarthritis, right shoulder: Secondary | ICD-10-CM

## 2015-11-14 DIAGNOSIS — J441 Chronic obstructive pulmonary disease with (acute) exacerbation: Secondary | ICD-10-CM

## 2015-11-14 NOTE — Telephone Encounter (Signed)
I am ok with orders, will ask nursing to send.   Laroy Apple, MD [facility] 11/14/2015, 4:42 PM

## 2015-11-17 NOTE — Telephone Encounter (Signed)
RX wrote but not sure who to send them to. I have called patient but no answer.

## 2015-11-17 NOTE — Addendum Note (Signed)
Addended by: Thana Ates on: 11/17/2015 09:04 AM   Modules accepted: Orders

## 2015-11-20 ENCOUNTER — Other Ambulatory Visit: Payer: Self-pay | Admitting: Orthopedic Surgery

## 2015-11-21 ENCOUNTER — Other Ambulatory Visit: Payer: Self-pay | Admitting: Orthopedic Surgery

## 2015-11-21 DIAGNOSIS — M25512 Pain in left shoulder: Secondary | ICD-10-CM

## 2015-11-24 NOTE — Telephone Encounter (Signed)
Spoke with Kingsley Callander Caps case manager and rx for nebulizer and cane were faxed to her at [phone].

## 2015-11-27 ENCOUNTER — Other Ambulatory Visit: Payer: Medicaid Other

## 2015-12-01 ENCOUNTER — Ambulatory Visit
Admission: RE | Admit: 2015-12-01 | Discharge: 2015-12-01 | Disposition: A | Discharge status: Home / Self Care | Payer: Medicaid Other | Source: Ambulatory Visit | Attending: Orthopedic Surgery | Admitting: Orthopedic Surgery

## 2015-12-01 DIAGNOSIS — M25512 Pain in left shoulder: Secondary | ICD-10-CM

## 2015-12-03 ENCOUNTER — Telehealth: Payer: Self-pay | Admitting: Family Medicine

## 2015-12-05 NOTE — Telephone Encounter (Signed)
Wrote for patient, on bradshaws desk

## 2015-12-15 ENCOUNTER — Other Ambulatory Visit: Payer: Self-pay | Admitting: Family Medicine

## 2015-12-16 ENCOUNTER — Encounter: Payer: Self-pay | Admitting: Family Medicine

## 2015-12-16 ENCOUNTER — Encounter: Payer: Medicaid Other | Admitting: *Deleted

## 2015-12-16 ENCOUNTER — Ambulatory Visit (INDEPENDENT_AMBULATORY_CARE_PROVIDER_SITE_OTHER): Payer: Medicaid Other | Admitting: Family Medicine

## 2015-12-16 VITALS — BP 135/92 | HR 106 | Temp 99.1°F | Ht 59.0 in | Wt 142.2 lb

## 2015-12-16 DIAGNOSIS — I10 Essential (primary) hypertension: Secondary | ICD-10-CM

## 2015-12-16 DIAGNOSIS — Z23 Encounter for immunization: Secondary | ICD-10-CM | POA: Diagnosis not present

## 2015-12-16 DIAGNOSIS — G894 Chronic pain syndrome: Secondary | ICD-10-CM

## 2015-12-16 DIAGNOSIS — Z0000 Encounter for general adult medical examination without abnormal findings: Secondary | ICD-10-CM

## 2015-12-16 LAB — HM MAMMOGRAPHY: HM MAMMO: NEGATIVE

## 2015-12-16 NOTE — Addendum Note (Signed)
Addended by: Karle Plumber on: 12/16/2015 09:48 AM   Modules accepted: Orders, SmartSet

## 2015-12-16 NOTE — Patient Instructions (Signed)
Great to see you!  Come back in 4 months unless you need [place] sooner.   I hope all of your shoulder surgeries go well!

## 2015-12-16 NOTE — Progress Notes (Signed)
   HPI  Patient presents today Here for follow-up hypertension, smoking, shoulder pain.  Shoulder pain, chronic pain She's had a recent right shoulder surgery, she is due for left shoulder replacement in the coming months. She is on hydrocodone by her orthopedic surgeon. She takes approximately 3 pills daily. She does admit to taking an occasional Xanax from a friend to help her sleep  ealthcare maintenance She has a mammogram scheduled today She is willing to get a tetanus shot.  Hypertension Not checking at home Compliant with medications, No chest pains or palpitations  Smoking Wanting and trying to quit  PMH: Smoking status noted ROS: Per HPI  Objective: BP 135/92 mmHg  Pulse 106  Temp(Src) 99.1 F (37.3 C) (Oral)  Ht 4\' 11"  (1.499 m)  Wt 142 lb 3.2 oz (64.501 kg)  BMI 28.71 kg/m2 Gen: NAD, alert, cooperative with exam HEENT: NCAT CV: RRR, good S1/S2, no murmur Resp: CTABL, no wheezes, non-labored Ext: No edema, warm Neuro: Alert and oriented, No gross deficits Denies SI  Assessment and plan:  # tobacco oabuse Trying to quit Recommended 1800 quit now  # HTN Slightly elevated today Continue current meds  # Chronic pain recvieving from ortho, not a candidate from [place] considering previous releases from pain clinics  Unable to get her established at a pain clinic - see below  Quote from message from referral dept "This pt has been referred to Pain Clinic multiple times since 2015. She is dismissed for non-compliance or she doesn't like what they suggest. She has a history of not responding when they call to schedule, therfore has to come back to [place] for her pain meds because she "hasn't been scheduled'. I am not sure that there is a pain clinic left that will accept her"   # HCM Tetanus shot today Having mammo today  Laroy Apple, MD [facility] 12/16/2015, 9:00 AM

## 2015-12-24 ENCOUNTER — Other Ambulatory Visit: Payer: Self-pay | Admitting: Orthopedic Surgery

## 2016-01-08 ENCOUNTER — Encounter: Payer: Self-pay | Admitting: *Deleted

## 2016-01-12 NOTE — Pre-Procedure Instructions (Signed)
TAKHIA CARSON  01/12/2016      [facility] [place] [address] [place] [address] Phone: [phone] Fax: [phone]    Your procedure is scheduled on Thurs, April 6 @ 7:30 AM  Report to [facility] Admitting at 5:30 AM  Call this number if you have problems the morning of surgery:  [phone]   Remember:  Do not eat food or drink liquids after midnight.  Take these medicines the morning of surgery with A SIP OF WATER Albuterol<Bring Your Inhaler With You>,Norvasc(Amlodipine),Cymbalta(Duloxetine),Atrovent,Dulera,Omeprazole(Prilosec),Pain Pill(if needed),and Spiriva.              No Goody's,BC's,Aleve,Aspirin,Ibuprofen,Motrin,Advil,Fish Oil,or any Herbal Medications.                 Do not wear jewelry, make-up or nail polish.  Do not wear lotions, powders, or perfumes.    Do not shave 48 hours prior to surgery.    Do not bring valuables to the hospital.  [facility] is not responsible for any belongings or valuables.  Contacts, dentures or bridgework may not be worn into surgery.  Leave your suitcase in the car.  After surgery it may be brought to your room.  For patients admitted to the hospital, discharge time will be determined by your treatment team.  Patients discharged the day of surgery will not be allowed to drive home.    Special instructions:  [facility] - Preparing for Surgery  Before surgery, you can play an important role.  Because skin is not sterile, your skin needs to be as free of germs as possible.  You can reduce the number of germs on you skin by washing with CHG (chlorahexidine gluconate) soap before surgery.  CHG is an antiseptic cleaner which kills germs and bonds with the skin to continue killing germs even after washing.  Please DO NOT use if you have an allergy to CHG or antibacterial soaps.  If your skin becomes reddened/irritated stop using the CHG and inform your nurse when you  arrive at Short Stay.  Do not shave (including legs and underarms) for at least 48 hours prior to the first CHG shower.  You may shave your face.  Please follow these instructions carefully:   1.  Shower with CHG Soap the night before surgery and the                                morning of Surgery.  2.  If you choose to wash your hair, wash your hair first as usual with your       normal shampoo.  3.  After you shampoo, rinse your hair and body thoroughly to remove the                      Shampoo.  4.  Use CHG as you would any other liquid soap.  You can apply chg directly       to the skin and wash gently with scrungie or a clean washcloth.  5.  Apply the CHG Soap to your body ONLY FROM THE NECK DOWN.        Do not use on open wounds or open sores.  Avoid contact with your eyes,       ears, mouth and genitals (private parts).  Wash genitals (private parts)  with your normal soap.  6.  Wash thoroughly, paying special attention to the area where your surgery        will be performed.  7.  Thoroughly rinse your body with warm water from the neck down.  8.  DO NOT shower/wash with your normal soap after using and rinsing off       the CHG Soap.  9.  Pat yourself dry with a clean towel.            10.  Wear clean pajamas.            11.  Place clean sheets on your bed the night of your first shower and do not        sleep with pets.  Day of Surgery  Do not apply any lotions/deoderants the morning of surgery.  Please wear clean clothes to the hospital/surgery center.    Please read over the following fact sheets that you were given. Pain Booklet, Coughing and Deep Breathing, MRSA Information and Surgical Site Infection Prevention

## 2016-01-13 ENCOUNTER — Encounter (HOSPITAL_COMMUNITY)
Admission: RE | Admit: 2016-01-13 | Discharge: 2016-01-13 | Disposition: A | Discharge status: Home / Self Care | Payer: Medicaid Other | Source: Ambulatory Visit | Attending: Orthopedic Surgery | Admitting: Orthopedic Surgery

## 2016-01-13 ENCOUNTER — Encounter (HOSPITAL_COMMUNITY): Payer: Self-pay

## 2016-01-13 DIAGNOSIS — Z01812 Encounter for preprocedural laboratory examination: Secondary | ICD-10-CM | POA: Insufficient documentation

## 2016-01-13 DIAGNOSIS — M75102 Unspecified rotator cuff tear or rupture of left shoulder, not specified as traumatic: Secondary | ICD-10-CM | POA: Diagnosis not present

## 2016-01-13 LAB — CBC WITH DIFFERENTIAL/PLATELET
BASOS ABS: 0.1 10*3/uL (ref 0.0–0.1)
BASOS PCT: 1 %
EOS ABS: 0.2 10*3/uL (ref 0.0–0.7)
Eosinophils Relative: 2 %
HEMATOCRIT: 46.8 % — AB (ref 36.0–46.0)
HEMOGLOBIN: 14.8 g/dL (ref 12.0–15.0)
Lymphocytes Relative: 29 %
Lymphs Abs: 2.9 10*3/uL (ref 0.7–4.0)
MCH: 30.1 pg (ref 26.0–34.0)
MCHC: 31.6 g/dL (ref 30.0–36.0)
MCV: 95.1 fL (ref 78.0–100.0)
MONOS PCT: 5 %
Monocytes Absolute: 0.5 10*3/uL (ref 0.1–1.0)
NEUTROS ABS: 6.5 10*3/uL (ref 1.7–7.7)
NEUTROS PCT: 63 %
Platelets: 262 10*3/uL (ref 150–400)
RBC: 4.92 MIL/uL (ref 3.87–5.11)
RDW: 13.7 % (ref 11.5–15.5)
WBC: 10.3 10*3/uL (ref 4.0–10.5)

## 2016-01-13 LAB — COMPREHENSIVE METABOLIC PANEL
ALBUMIN: 3.9 g/dL (ref 3.5–5.0)
ALK PHOS: 72 U/L (ref 38–126)
ALT: 20 U/L (ref 14–54)
ANION GAP: 11 (ref 5–15)
AST: 26 U/L (ref 15–41)
BILIRUBIN TOTAL: 0.4 mg/dL (ref 0.3–1.2)
BUN: 19 mg/dL (ref 6–20)
CALCIUM: 9.5 mg/dL (ref 8.9–10.3)
CO2: 26 mmol/L (ref 22–32)
Chloride: 103 mmol/L (ref 101–111)
Creatinine, Ser: 0.52 mg/dL (ref 0.44–1.00)
GFR calc Af Amer: >60 mL/min (ref >60)
GFR calc non Af Amer: >60 mL/min (ref >60)
GLUCOSE: 95 mg/dL (ref 65–99)
Potassium: 4.6 mmol/L (ref 3.5–5.1)
SODIUM: 140 mmol/L (ref 135–145)
TOTAL PROTEIN: 6.6 g/dL (ref 6.5–8.1)

## 2016-01-13 LAB — SURGICAL PCR SCREEN
MRSA, PCR: NEGATIVE
STAPHYLOCOCCUS AUREUS: NEGATIVE

## 2016-01-13 LAB — URINALYSIS, ROUTINE W REFLEX MICROSCOPIC
Bilirubin Urine: NEGATIVE
Glucose, UA: NEGATIVE mg/dL
HGB URINE DIPSTICK: NEGATIVE
Ketones, ur: NEGATIVE mg/dL
Leukocytes, UA: NEGATIVE
NITRITE: NEGATIVE
PH: 7.5 (ref 5.0–8.0)
Protein, ur: NEGATIVE mg/dL
SPECIFIC GRAVITY, URINE: 1.019 (ref 1.005–1.030)

## 2016-01-13 LAB — APTT: APTT: 27 s (ref 24–37)

## 2016-01-13 LAB — PROTIME-INR
INR: 0.98 (ref 0.00–1.49)
Prothrombin Time: 13.2 seconds (ref 11.6–15.2)

## 2016-01-17 DIAGNOSIS — S62102A Fracture of unspecified carpal bone, left wrist, initial encounter for closed fracture: Secondary | ICD-10-CM

## 2016-01-17 HISTORY — DX: Fracture of unspecified carpal bone, left wrist, initial encounter for closed fracture: S62.102A

## 2016-01-22 ENCOUNTER — Encounter (HOSPITAL_COMMUNITY): Admission: RE | Payer: Self-pay | Source: Ambulatory Visit

## 2016-01-22 ENCOUNTER — Inpatient Hospital Stay (HOSPITAL_COMMUNITY): Admission: RE | Admit: 2016-01-22 | Payer: Medicaid Other | Source: Ambulatory Visit | Admitting: Orthopedic Surgery

## 2016-01-22 SURGERY — ARTHROPLASTY, SHOULDER, TOTAL, REVERSE
Anesthesia: Choice | Laterality: Left

## 2016-01-28 ENCOUNTER — Other Ambulatory Visit: Payer: Self-pay | Admitting: Orthopedic Surgery

## 2016-02-06 ENCOUNTER — Telehealth: Payer: Self-pay | Admitting: Family Medicine

## 2016-02-12 ENCOUNTER — Ambulatory Visit: Payer: Medicaid Other | Admitting: Family Medicine

## 2016-02-13 ENCOUNTER — Encounter: Payer: Self-pay | Admitting: Family Medicine

## 2016-02-17 ENCOUNTER — Ambulatory Visit: Payer: Medicaid Other | Admitting: Pulmonary Disease

## 2016-02-17 ENCOUNTER — Other Ambulatory Visit (HOSPITAL_COMMUNITY): Payer: Self-pay | Admitting: *Deleted

## 2016-02-17 ENCOUNTER — Other Ambulatory Visit: Payer: Self-pay | Admitting: Family Medicine

## 2016-02-17 ENCOUNTER — Other Ambulatory Visit: Payer: Self-pay | Admitting: Physician Assistant

## 2016-02-17 NOTE — Pre-Procedure Instructions (Signed)
    Brenda Duran  02/17/2016      [facility] - [place], [place] [address] [place] [address] Phone: [phone] Fax: [phone]    Your procedure is scheduled on Thursday, Feb 26, 2016 at 9:40 AM.   Report to [facility] Entrance "A" Admitting Office at 8:40 AM.   Call this number if you have problems the morning of surgery: [phone]   Any questions prior to day of surgery, please call [phone] between 8 & 4 PM.   Remember:  Do not eat food or drink liquids after midnight Wednesday, 02/25/16.  Take these medicines the morning of surgery with A SIP OF WATER: Amlodipine (Norvasc), Duloxetine (Cymbalta), Omeprazole (Prilosec), Spiriva Handihaler,  Dulera Inhaler, Atrovent nebulizer, Hydrocodone - if needed, Albuterol nebulizer - if needed, Combivent inhaler - if needed  Stop NSAIDS (Indocin, Ibuprofen, Aleve, etc.) 7 days prior to surgery. Do not use any Aspirin products 7 days prior to surgery.   Do not wear jewelry, make-up or nail polish.  Do not wear lotions, powders, or perfumes.  You may NOT wear deodorant.  Do not shave 48 hours prior to surgery.   Do not bring valuables to the hospital.  [facility] is not responsible for any belongings or valuables.  Contacts, dentures or bridgework may not be worn into surgery.  Leave your suitcase in the car.  After surgery it may be brought to your room.  For patients admitted to the hospital, discharge time will be determined by your treatment team.  Special instructions:  See "Preparing for Surgery" Instruction sheet.  Please read over the following fact sheets that you were given. Pain Booklet, Coughing and Deep Breathing, MRSA Information and Surgical Site Infection Prevention

## 2016-02-18 ENCOUNTER — Encounter (HOSPITAL_COMMUNITY)
Admission: RE | Admit: 2016-02-18 | Discharge: 2016-02-18 | Disposition: A | Discharge status: Home / Self Care | Payer: Medicaid Other | Source: Ambulatory Visit | Attending: Orthopedic Surgery | Admitting: Orthopedic Surgery

## 2016-02-18 ENCOUNTER — Encounter (HOSPITAL_COMMUNITY): Payer: Self-pay

## 2016-02-18 ENCOUNTER — Other Ambulatory Visit: Payer: Self-pay | Admitting: Orthopedic Surgery

## 2016-02-18 DIAGNOSIS — Z01818 Encounter for other preprocedural examination: Secondary | ICD-10-CM | POA: Diagnosis not present

## 2016-02-18 DIAGNOSIS — F17200 Nicotine dependence, unspecified, uncomplicated: Secondary | ICD-10-CM | POA: Diagnosis not present

## 2016-02-18 DIAGNOSIS — Z79899 Other long term (current) drug therapy: Secondary | ICD-10-CM | POA: Diagnosis not present

## 2016-02-18 DIAGNOSIS — K219 Gastro-esophageal reflux disease without esophagitis: Secondary | ICD-10-CM | POA: Diagnosis not present

## 2016-02-18 DIAGNOSIS — M19012 Primary osteoarthritis, left shoulder: Secondary | ICD-10-CM | POA: Insufficient documentation

## 2016-02-18 DIAGNOSIS — J449 Chronic obstructive pulmonary disease, unspecified: Secondary | ICD-10-CM | POA: Diagnosis not present

## 2016-02-18 DIAGNOSIS — I10 Essential (primary) hypertension: Secondary | ICD-10-CM | POA: Diagnosis not present

## 2016-02-18 DIAGNOSIS — Z01812 Encounter for preprocedural laboratory examination: Secondary | ICD-10-CM | POA: Insufficient documentation

## 2016-02-18 DIAGNOSIS — G4733 Obstructive sleep apnea (adult) (pediatric): Secondary | ICD-10-CM | POA: Diagnosis not present

## 2016-02-18 HISTORY — DX: Fracture of unspecified carpal bone, left wrist, initial encounter for closed fracture: S62.102A

## 2016-02-18 LAB — CBC WITH DIFFERENTIAL/PLATELET
BASOS ABS: 0 10*3/uL (ref 0.0–0.1)
Basophils Relative: 0 %
EOS ABS: 0.2 10*3/uL (ref 0.0–0.7)
EOS PCT: 2 %
HCT: 50.8 % — ABNORMAL HIGH (ref 36.0–46.0)
HEMOGLOBIN: 16.3 g/dL — AB (ref 12.0–15.0)
LYMPHS ABS: 2.3 10*3/uL (ref 0.7–4.0)
LYMPHS PCT: 26 %
MCH: 30 pg (ref 26.0–34.0)
MCHC: 32.1 g/dL (ref 30.0–36.0)
MCV: 93.4 fL (ref 78.0–100.0)
Monocytes Absolute: 0.4 10*3/uL (ref 0.1–1.0)
Monocytes Relative: 5 %
NEUTROS PCT: 67 %
Neutro Abs: 5.8 10*3/uL (ref 1.7–7.7)
PLATELETS: 297 10*3/uL (ref 150–400)
RBC: 5.44 MIL/uL — AB (ref 3.87–5.11)
RDW: 13.6 % (ref 11.5–15.5)
WBC: 8.8 10*3/uL (ref 4.0–10.5)

## 2016-02-18 LAB — URINALYSIS, ROUTINE W REFLEX MICROSCOPIC
Bilirubin Urine: NEGATIVE
GLUCOSE, UA: NEGATIVE mg/dL
HGB URINE DIPSTICK: NEGATIVE
Ketones, ur: NEGATIVE mg/dL
LEUKOCYTES UA: NEGATIVE
Nitrite: NEGATIVE
PH: 5.5 (ref 5.0–8.0)
PROTEIN: NEGATIVE mg/dL
SPECIFIC GRAVITY, URINE: 1.022 (ref 1.005–1.030)

## 2016-02-18 LAB — COMPREHENSIVE METABOLIC PANEL
ALK PHOS: 91 U/L (ref 38–126)
ALT: 16 U/L (ref 14–54)
AST: 22 U/L (ref 15–41)
Albumin: 4.3 g/dL (ref 3.5–5.0)
Anion gap: 9 (ref 5–15)
BUN: 18 mg/dL (ref 6–20)
CALCIUM: 9.8 mg/dL (ref 8.9–10.3)
CO2: 27 mmol/L (ref 22–32)
CREATININE: 0.6 mg/dL (ref 0.44–1.00)
Chloride: 105 mmol/L (ref 101–111)
GFR calc non Af Amer: >60 mL/min (ref >60)
GLUCOSE: 105 mg/dL — AB (ref 65–99)
Potassium: 4.5 mmol/L (ref 3.5–5.1)
SODIUM: 141 mmol/L (ref 135–145)
Total Bilirubin: 0.4 mg/dL (ref 0.3–1.2)
Total Protein: 7.3 g/dL (ref 6.5–8.1)

## 2016-02-18 LAB — PROTIME-INR
INR: 1.03 (ref 0.00–1.49)
Prothrombin Time: 13.7 seconds (ref 11.6–15.2)

## 2016-02-18 LAB — SURGICAL PCR SCREEN
MRSA, PCR: NEGATIVE
Staphylococcus aureus: NEGATIVE

## 2016-02-18 LAB — APTT: APTT: 29 s (ref 24–37)

## 2016-02-18 NOTE — Pre-Procedure Instructions (Signed)
    Brenda Duran  02/18/2016      [facility] - [place], [place] - [place] [address] Phone: [phone] Fax: [phone]    Your procedure is scheduled on Thursday, Feb 26, 2016 at 9:40 AM.   Report to [facility] Entrance "A" Admitting Office at 7:40 AM.   Call this number if you have problems the morning of surgery: [phone]   Any questions prior to day of surgery, please call [phone] between 8 & 4 PM.   Remember:  Do not eat food or drink liquids after midnight Wednesday, 02/25/16.   Take these medicines the morning of surgery with A SIP OF WATER: Amlodipine (Norvasc), Duloxetine (Cymbalta), Omeprazole (Prilosec), Spiriva Handihaler,  Dulera Inhaler, Atrovent nebulizer, Hydrocodone - if needed, Albuterol nebulizer - if needed, Combivent inhaler - if needed  Stop NSAIDS (Indocin, Ibuprofen, Aleve, etc.) 7 days prior to surgery. Do not use any Aspirin products 7 days prior to surgery.   Do not wear jewelry, make-up or nail polish.  Do not wear lotions, powders, or perfumes.  You may NOT wear deodorant.  Do not shave 48 hours prior to surgery.   Do not bring valuables to the hospital.  [facility] is not responsible for any belongings or valuables.  Contacts, dentures or bridgework may not be worn into surgery.  Leave your suitcase in the car.  After surgery it may be brought to your room.  For patients admitted to the hospital, discharge time will be determined by your treatment team.  Special instructions:  See "Preparing for Surgery" Instruction sheet.  Please read over the following fact sheets that you were given. Pain Booklet, Coughing and Deep Breathing, MRSA Information and Surgical Site Infection Prevention

## 2016-02-18 NOTE — Progress Notes (Signed)
PCP:Dr.Bradshaw @ [facility] Pulmonary:Dr. Mannam @ [facility]. Pt. Stated she uses oxygen at night prn.  Pt. States she uses marijuana-last use yesterday and cocaine last week. Discusses with pt. Not using prior to surgery. Royann Shivers aware.  Last seizures were 4-5 yrs. ago

## 2016-02-18 NOTE — Progress Notes (Signed)
Anesthesia Chart Review:   Pt is a 55 year old female scheduled for L reverse shoulder arthroplasty on 02/26/2016 with Dr. Tamera Punt.   PCP is Dr. Laroy Apple, last visit 12/16/15. Pulmonologist is Dr. Vaughan Browner, last office visit 08/21/15.  PMH includes: HTN, severe COPD (uses home O2 at night prn), OSA, seizures (last in 2012), GERD, polysubstance abuse (last cocaine use was last week), binge alcohol use. Current smoker. BMI 29.   Medications include: albuterol, combivent, amlodipine, atrovent, dulera, prilosec, spiriva  Preoperative labs reviewed.   Chest x-ray 09/05/15 reviewed. No active cardiopulmonary disease  EKG 09/05/15: NSR. Possible Left atrial enlargement.  PFTs 08/21/15:  FVC 2.58 [89%] FEV1 1.03 [45%] F/F 40 TLC 128% DLCO 54%. No broncho-dilator response.  PAT RN notified Dr. Bettina Gavia office of pt's recent cocaine use. Dr. Tamera Punt would like a drug screen done DOS.   If lab results acceptable DOS, I anticipate pt can proceed as scheduled.   Willeen Cass, FNP-BC [facility]/Anesthesiology Phone: [phone] 02/18/2016 4:05 PM

## 2016-02-19 ENCOUNTER — Other Ambulatory Visit: Payer: Self-pay | Admitting: Orthopedic Surgery

## 2016-02-26 ENCOUNTER — Inpatient Hospital Stay (HOSPITAL_COMMUNITY): Payer: Medicaid Other | Admitting: Certified Registered Nurse Anesthetist

## 2016-02-26 ENCOUNTER — Inpatient Hospital Stay (HOSPITAL_COMMUNITY)
Admission: RE | Admit: 2016-02-26 | Discharge: 2016-02-27 | DRG: 483 | Disposition: A | Discharge status: Home / Self Care | Payer: Medicaid Other | Source: Ambulatory Visit | Attending: Orthopedic Surgery | Admitting: Orthopedic Surgery

## 2016-02-26 ENCOUNTER — Inpatient Hospital Stay (HOSPITAL_COMMUNITY): Payer: Medicaid Other

## 2016-02-26 ENCOUNTER — Encounter (HOSPITAL_COMMUNITY): Payer: Self-pay | Admitting: *Deleted

## 2016-02-26 ENCOUNTER — Encounter (HOSPITAL_COMMUNITY)
Admission: RE | Disposition: A | Discharge status: Home / Self Care | Payer: Self-pay | Source: Ambulatory Visit | Attending: Orthopedic Surgery

## 2016-02-26 ENCOUNTER — Inpatient Hospital Stay (HOSPITAL_COMMUNITY): Payer: Medicaid Other | Admitting: Vascular Surgery

## 2016-02-26 DIAGNOSIS — M8580 Other specified disorders of bone density and structure, unspecified site: Secondary | ICD-10-CM | POA: Diagnosis present

## 2016-02-26 DIAGNOSIS — I10 Essential (primary) hypertension: Secondary | ICD-10-CM | POA: Diagnosis present

## 2016-02-26 DIAGNOSIS — Z7951 Long term (current) use of inhaled steroids: Secondary | ICD-10-CM | POA: Diagnosis not present

## 2016-02-26 DIAGNOSIS — M75102 Unspecified rotator cuff tear or rupture of left shoulder, not specified as traumatic: Secondary | ICD-10-CM | POA: Diagnosis present

## 2016-02-26 DIAGNOSIS — J449 Chronic obstructive pulmonary disease, unspecified: Secondary | ICD-10-CM | POA: Diagnosis present

## 2016-02-26 DIAGNOSIS — M19012 Primary osteoarthritis, left shoulder: Principal | ICD-10-CM | POA: Diagnosis present

## 2016-02-26 DIAGNOSIS — F329 Major depressive disorder, single episode, unspecified: Secondary | ICD-10-CM | POA: Diagnosis present

## 2016-02-26 DIAGNOSIS — Z7983 Long term (current) use of bisphosphonates: Secondary | ICD-10-CM | POA: Diagnosis not present

## 2016-02-26 DIAGNOSIS — F17210 Nicotine dependence, cigarettes, uncomplicated: Secondary | ICD-10-CM | POA: Diagnosis present

## 2016-02-26 DIAGNOSIS — K219 Gastro-esophageal reflux disease without esophagitis: Secondary | ICD-10-CM | POA: Diagnosis present

## 2016-02-26 DIAGNOSIS — Z79899 Other long term (current) drug therapy: Secondary | ICD-10-CM

## 2016-02-26 DIAGNOSIS — M25512 Pain in left shoulder: Secondary | ICD-10-CM | POA: Diagnosis present

## 2016-02-26 DIAGNOSIS — Z96612 Presence of left artificial shoulder joint: Secondary | ICD-10-CM

## 2016-02-26 DIAGNOSIS — Z96619 Presence of unspecified artificial shoulder joint: Secondary | ICD-10-CM

## 2016-02-26 HISTORY — PX: REVERSE SHOULDER ARTHROPLASTY: SHX5054

## 2016-02-26 LAB — RAPID URINE DRUG SCREEN, HOSP PERFORMED
Amphetamines: NOT DETECTED
Barbiturates: NOT DETECTED
Benzodiazepines: POSITIVE — AB
COCAINE: NOT DETECTED
OPIATES: POSITIVE — AB
TETRAHYDROCANNABINOL: POSITIVE — AB

## 2016-02-26 SURGERY — ARTHROPLASTY, SHOULDER, TOTAL, REVERSE
Anesthesia: General | Laterality: Left

## 2016-02-26 MED ORDER — FENTANYL CITRATE (PF) 250 MCG/5ML IJ SOLN
INTRAMUSCULAR | Status: AC
  Filled 2016-02-26: qty 5

## 2016-02-26 MED ORDER — LIDOCAINE HCL (CARDIAC) 20 MG/ML IV SOLN
INTRAVENOUS | Status: DC | PRN
  Administered 2016-02-26: 50 mg via INTRAVENOUS

## 2016-02-26 MED ORDER — METOCLOPRAMIDE HCL 5 MG/ML IJ SOLN: 5.0000 mg | Freq: Three times a day (TID) | INTRAMUSCULAR | Status: DC | PRN

## 2016-02-26 MED ORDER — NICOTINE 21 MG/24HR TD PT24
21.0000 mg | MEDICATED_PATCH | Freq: Every day | TRANSDERMAL | Status: DC
  Administered 2016-02-26 – 2016-02-27 (×2): 21 mg via TRANSDERMAL
  Filled 2016-02-26 (×2): qty 1

## 2016-02-26 MED ORDER — TRAZODONE HCL 100 MG PO TABS
100.0000 mg | ORAL_TABLET | Freq: Every evening | ORAL | Status: DC | PRN
Start: 2016-02-26 — End: 2016-02-27

## 2016-02-26 MED ORDER — POLYETHYLENE GLYCOL 3350 17 G PO PACK
17.0000 g | PACK | Freq: Every day | ORAL | Status: DC | PRN
  Filled 2016-02-26: qty 1

## 2016-02-26 MED ORDER — HYDROCODONE-ACETAMINOPHEN 7.5-325 MG PO TABS
1.0000 | ORAL_TABLET | ORAL | Status: DC | PRN
  Administered 2016-02-26 (×2): 1 via ORAL
  Administered 2016-02-27 (×3): 2 via ORAL
  Filled 2016-02-26 (×2): qty 2
  Filled 2016-02-26: qty 1
  Filled 2016-02-26: qty 2
  Filled 2016-02-26: qty 1

## 2016-02-26 MED ORDER — LIDOCAINE 2% (20 MG/ML) 5 ML SYRINGE
INTRAMUSCULAR | Status: AC
  Filled 2016-02-26: qty 5

## 2016-02-26 MED ORDER — ROCURONIUM BROMIDE 100 MG/10ML IV SOLN
INTRAVENOUS | Status: DC | PRN
  Administered 2016-02-26: 10 mg via INTRAVENOUS
  Administered 2016-02-26: 50 mg via INTRAVENOUS

## 2016-02-26 MED ORDER — HYDROMORPHONE HCL 1 MG/ML IJ SOLN
0.2500 mg | INTRAMUSCULAR | Status: DC | PRN
  Administered 2016-02-26 (×2): 0.5 mg via INTRAVENOUS

## 2016-02-26 MED ORDER — ALBUTEROL SULFATE (2.5 MG/3ML) 0.083% IN NEBU
2.5000 mg | INHALATION_SOLUTION | Freq: Once | RESPIRATORY_TRACT | Status: AC
  Administered 2016-02-26: 2.5 mg via RESPIRATORY_TRACT
  Filled 2016-02-26: qty 3

## 2016-02-26 MED ORDER — ONDANSETRON HCL 4 MG/2ML IJ SOLN
INTRAMUSCULAR | Status: DC | PRN
  Administered 2016-02-26: 4 mg via INTRAVENOUS

## 2016-02-26 MED ORDER — BISACODYL 5 MG PO TBEC: 5.0000 mg | DELAYED_RELEASE_TABLET | Freq: Every day | ORAL | Status: DC | PRN

## 2016-02-26 MED ORDER — MEPERIDINE HCL 25 MG/ML IJ SOLN: 6.2500 mg | INTRAMUSCULAR | Status: DC | PRN

## 2016-02-26 MED ORDER — SUGAMMADEX SODIUM 200 MG/2ML IV SOLN
INTRAVENOUS | Status: AC
  Filled 2016-02-26: qty 2

## 2016-02-26 MED ORDER — CEFAZOLIN SODIUM 1-5 GM-% IV SOLN
1.0000 g | Freq: Four times a day (QID) | INTRAVENOUS | Status: AC
  Administered 2016-02-26 – 2016-02-27 (×3): 1 g via INTRAVENOUS
  Filled 2016-02-26 (×4): qty 50

## 2016-02-26 MED ORDER — MENTHOL 3 MG MT LOZG: 1.0000 | LOZENGE | OROMUCOSAL | Status: DC | PRN

## 2016-02-26 MED ORDER — ROCURONIUM BROMIDE 50 MG/5ML IV SOLN
INTRAVENOUS | Status: AC
  Filled 2016-02-26: qty 2

## 2016-02-26 MED ORDER — ONDANSETRON HCL 4 MG PO TABS: 4.0000 mg | ORAL_TABLET | Freq: Four times a day (QID) | ORAL | Status: DC | PRN

## 2016-02-26 MED ORDER — MIDAZOLAM HCL 2 MG/2ML IJ SOLN
INTRAMUSCULAR | Status: AC
  Filled 2016-02-26: qty 2

## 2016-02-26 MED ORDER — PHENYLEPHRINE 40 MCG/ML (10ML) SYRINGE FOR IV PUSH (FOR BLOOD PRESSURE SUPPORT)
PREFILLED_SYRINGE | INTRAVENOUS | Status: AC
  Filled 2016-02-26: qty 10

## 2016-02-26 MED ORDER — PHENYLEPHRINE HCL 10 MG/ML IJ SOLN
10.0000 mg | INTRAVENOUS | Status: DC | PRN
  Administered 2016-02-26: 20 ug/min via INTRAVENOUS

## 2016-02-26 MED ORDER — ROCURONIUM BROMIDE 50 MG/5ML IV SOLN
INTRAVENOUS | Status: AC
  Filled 2016-02-26: qty 1

## 2016-02-26 MED ORDER — BUPIVACAINE LIPOSOME 1.3 % IJ SUSP
20.0000 mL | INTRAMUSCULAR | Status: DC
  Filled 2016-02-26: qty 20

## 2016-02-26 MED ORDER — IPRATROPIUM BROMIDE 0.02 % IN SOLN
0.2500 mg | Freq: Four times a day (QID) | RESPIRATORY_TRACT | Status: DC
  Administered 2016-02-26 – 2016-02-27 (×2): 0.25 mg via RESPIRATORY_TRACT
  Filled 2016-02-26 (×4): qty 2.5

## 2016-02-26 MED ORDER — IPRATROPIUM BROMIDE 0.02 % IN SOLN
RESPIRATORY_TRACT | Status: AC
  Administered 2016-02-26: 0.25 mg via RESPIRATORY_TRACT
  Filled 2016-02-26: qty 2.5

## 2016-02-26 MED ORDER — CEFAZOLIN SODIUM-DEXTROSE 2-4 GM/100ML-% IV SOLN
2.0000 g | INTRAVENOUS | Status: AC
  Administered 2016-02-26: 2 g via INTRAVENOUS
  Filled 2016-02-26: qty 100

## 2016-02-26 MED ORDER — PROPOFOL 10 MG/ML IV BOLUS
INTRAVENOUS | Status: AC
  Filled 2016-02-26: qty 20

## 2016-02-26 MED ORDER — FLEET ENEMA 7-19 GM/118ML RE ENEM: 1.0000 | ENEMA | Freq: Once | RECTAL | Status: DC | PRN

## 2016-02-26 MED ORDER — PHENOL 1.4 % MT LIQD: 1.0000 | OROMUCOSAL | Status: DC | PRN

## 2016-02-26 MED ORDER — IPRATROPIUM-ALBUTEROL 0.5-2.5 (3) MG/3ML IN SOLN
3.0000 mL | Freq: Four times a day (QID) | RESPIRATORY_TRACT | Status: DC | PRN
  Administered 2016-02-26 – 2016-02-27 (×2): 3 mL via RESPIRATORY_TRACT
  Filled 2016-02-26 (×2): qty 3

## 2016-02-26 MED ORDER — ATROPINE SULFATE 0.4 MG/ML IV SOSY
PREFILLED_SYRINGE | INTRAVENOUS | Status: AC
  Filled 2016-02-26: qty 2.5

## 2016-02-26 MED ORDER — SUGAMMADEX SODIUM 200 MG/2ML IV SOLN
INTRAVENOUS | Status: DC | PRN
  Administered 2016-02-26: 150 mg via INTRAVENOUS

## 2016-02-26 MED ORDER — FENTANYL CITRATE (PF) 100 MCG/2ML IJ SOLN
INTRAMUSCULAR | Status: AC
  Filled 2016-02-26: qty 2

## 2016-02-26 MED ORDER — AMLODIPINE BESYLATE 5 MG PO TABS
5.0000 mg | ORAL_TABLET | Freq: Every day | ORAL | Status: DC
  Administered 2016-02-27: 5 mg via ORAL
  Filled 2016-02-26: qty 1

## 2016-02-26 MED ORDER — ONDANSETRON HCL 4 MG/2ML IJ SOLN: 4.0000 mg | Freq: Four times a day (QID) | INTRAMUSCULAR | Status: DC | PRN

## 2016-02-26 MED ORDER — BUPIVACAINE LIPOSOME 1.3 % IJ SUSP
INTRAMUSCULAR | Status: DC | PRN
  Administered 2016-02-26: 20 mL

## 2016-02-26 MED ORDER — ALBUTEROL SULFATE (2.5 MG/3ML) 0.083% IN NEBU
INHALATION_SOLUTION | RESPIRATORY_TRACT | Status: AC
  Filled 2016-02-26: qty 3

## 2016-02-26 MED ORDER — METOCLOPRAMIDE HCL 5 MG PO TABS: 5.0000 mg | ORAL_TABLET | Freq: Three times a day (TID) | ORAL | Status: DC | PRN

## 2016-02-26 MED ORDER — 0.9 % SODIUM CHLORIDE (POUR BTL) OPTIME
TOPICAL | Status: DC | PRN
  Administered 2016-02-26: 1000 mL

## 2016-02-26 MED ORDER — LACTATED RINGERS IV SOLN: INTRAVENOUS | Status: DC

## 2016-02-26 MED ORDER — ACETAMINOPHEN 10 MG/ML IV SOLN
INTRAVENOUS | Status: AC
  Filled 2016-02-26: qty 100

## 2016-02-26 MED ORDER — DIPHENHYDRAMINE HCL 12.5 MG/5ML PO ELIX: 12.5000 mg | ORAL_SOLUTION | ORAL | Status: DC | PRN

## 2016-02-26 MED ORDER — POVIDONE-IODINE 7.5 % EX SOLN
Freq: Once | CUTANEOUS | Status: DC
  Filled 2016-02-26: qty 118

## 2016-02-26 MED ORDER — PROPOFOL 10 MG/ML IV BOLUS
INTRAVENOUS | Status: DC | PRN
  Administered 2016-02-26: 130 mg via INTRAVENOUS

## 2016-02-26 MED ORDER — ACETAMINOPHEN 10 MG/ML IV SOLN
INTRAVENOUS | Status: DC | PRN
  Administered 2016-02-26: 1000 mg via INTRAVENOUS

## 2016-02-26 MED ORDER — BUPIVACAINE-EPINEPHRINE (PF) 0.25% -1:200000 IJ SOLN
INTRAMUSCULAR | Status: AC
  Filled 2016-02-26: qty 30

## 2016-02-26 MED ORDER — MOMETASONE FURO-FORMOTEROL FUM 100-5 MCG/ACT IN AERO
2.0000 | INHALATION_SPRAY | Freq: Two times a day (BID) | RESPIRATORY_TRACT | Status: DC
  Administered 2016-02-26 – 2016-02-27 (×2): 2 via RESPIRATORY_TRACT
  Filled 2016-02-26: qty 8.8

## 2016-02-26 MED ORDER — FENTANYL CITRATE (PF) 100 MCG/2ML IJ SOLN
INTRAMUSCULAR | Status: DC | PRN
  Administered 2016-02-26 (×5): 50 ug via INTRAVENOUS

## 2016-02-26 MED ORDER — EPHEDRINE 5 MG/ML INJ
INTRAVENOUS | Status: AC
  Filled 2016-02-26: qty 10

## 2016-02-26 MED ORDER — DOCUSATE SODIUM 100 MG PO CAPS
100.0000 mg | ORAL_CAPSULE | Freq: Two times a day (BID) | ORAL | Status: DC
  Administered 2016-02-26 – 2016-02-27 (×3): 100 mg via ORAL
  Filled 2016-02-26 (×3): qty 1

## 2016-02-26 MED ORDER — ASPIRIN EC 325 MG PO TBEC
325.0000 mg | DELAYED_RELEASE_TABLET | Freq: Two times a day (BID) | ORAL | Status: DC
  Administered 2016-02-26 – 2016-02-27 (×3): 325 mg via ORAL
  Filled 2016-02-26 (×3): qty 1

## 2016-02-26 MED ORDER — PROCHLORPERAZINE EDISYLATE 5 MG/ML IJ SOLN: 10.0000 mg | INTRAMUSCULAR | Status: DC | PRN

## 2016-02-26 MED ORDER — ALUM & MAG HYDROXIDE-SIMETH 200-200-20 MG/5ML PO SUSP: 30.0000 mL | ORAL | Status: DC | PRN

## 2016-02-26 MED ORDER — METHOCARBAMOL 500 MG PO TABS
500.0000 mg | ORAL_TABLET | Freq: Every day | ORAL | Status: DC
  Administered 2016-02-27: 1000 mg via ORAL
  Filled 2016-02-26: qty 2

## 2016-02-26 MED ORDER — POTASSIUM CHLORIDE IN NACL 20-0.45 MEQ/L-% IV SOLN
INTRAVENOUS | Status: DC
  Administered 2016-02-26: 17:00:00 via INTRAVENOUS
  Filled 2016-02-26 (×5): qty 1000

## 2016-02-26 MED ORDER — HYDROMORPHONE HCL 1 MG/ML IJ SOLN
INTRAMUSCULAR | Status: AC
  Filled 2016-02-26: qty 1

## 2016-02-26 MED ORDER — ESMOLOL HCL 100 MG/10ML IV SOLN
INTRAVENOUS | Status: DC | PRN
  Administered 2016-02-26 (×3): 20 mg via INTRAVENOUS

## 2016-02-26 MED ORDER — BUPIVACAINE-EPINEPHRINE 0.25% -1:200000 IJ SOLN
INTRAMUSCULAR | Status: DC | PRN
  Administered 2016-02-26: 20 mL

## 2016-02-26 MED ORDER — PANTOPRAZOLE SODIUM 40 MG PO TBEC
40.0000 mg | DELAYED_RELEASE_TABLET | Freq: Every day | ORAL | Status: DC
  Administered 2016-02-27: 40 mg via ORAL
  Filled 2016-02-26: qty 1

## 2016-02-26 MED ORDER — MORPHINE SULFATE (PF) 2 MG/ML IV SOLN
2.0000 mg | INTRAVENOUS | Status: DC | PRN
  Administered 2016-02-26 – 2016-02-27 (×2): 2 mg via INTRAVENOUS
  Filled 2016-02-26 (×2): qty 1

## 2016-02-26 MED ORDER — LACTATED RINGERS IV SOLN
INTRAVENOUS | Status: DC
  Administered 2016-02-26 (×2): via INTRAVENOUS

## 2016-02-26 MED ORDER — SUCCINYLCHOLINE CHLORIDE 200 MG/10ML IV SOSY
PREFILLED_SYRINGE | INTRAVENOUS | Status: AC
  Filled 2016-02-26: qty 10

## 2016-02-26 MED ORDER — TIOTROPIUM BROMIDE MONOHYDRATE 18 MCG IN CAPS
18.0000 ug | ORAL_CAPSULE | Freq: Every day | RESPIRATORY_TRACT | Status: DC
  Administered 2016-02-27: 18 ug via RESPIRATORY_TRACT
  Filled 2016-02-26: qty 5

## 2016-02-26 MED ORDER — DULOXETINE HCL 60 MG PO CPEP
60.0000 mg | ORAL_CAPSULE | Freq: Every day | ORAL | Status: DC
  Administered 2016-02-27: 60 mg via ORAL
  Filled 2016-02-26: qty 1

## 2016-02-26 SURGICAL SUPPLY — 80 items
BASEPLATE P2 COATD GLND 6.5X30 (Shoulder) ×1 IMPLANT
BIT DRILL 5/64X5 DISP (BIT) ×3 IMPLANT
BLADE SAW SAG 73X25 THK (BLADE) ×2
BLADE SAW SGTL 73X25 THK (BLADE) ×1 IMPLANT
BLADE SURG 15 STRL LF DISP TIS (BLADE) ×1 IMPLANT
BLADE SURG 15 STRL SS (BLADE) ×2
BOWL SMART MIX CTS (DISPOSABLE) IMPLANT
CHLORAPREP W/TINT 26ML (MISCELLANEOUS) ×3 IMPLANT
CLOSURE WOUND 1/2 X4 (GAUZE/BANDAGES/DRESSINGS) ×1
COVER MAYO STAND STRL (DRAPES) IMPLANT
COVER SURGICAL LIGHT HANDLE (MISCELLANEOUS) ×3 IMPLANT
DRAPE INCISE IOBAN 66X45 STRL (DRAPES) ×3 IMPLANT
DRAPE ORTHO SPLIT 77X108 STRL (DRAPES) ×4
DRAPE SURG ORHT 6 SPLT 77X108 (DRAPES) ×2 IMPLANT
DRSG AQUACEL AG ADV 3.5X10 (GAUZE/BANDAGES/DRESSINGS) ×3 IMPLANT
ELECT BLADE 4.0 EZ CLEAN MEGAD (MISCELLANEOUS) ×3
ELECT REM PT RETURN 9FT ADLT (ELECTROSURGICAL) ×3
ELECTRODE BLDE 4.0 EZ CLN MEGD (MISCELLANEOUS) ×1 IMPLANT
ELECTRODE REM PT RTRN 9FT ADLT (ELECTROSURGICAL) ×1 IMPLANT
EVACUATOR 1/8 PVC DRAIN (DRAIN) IMPLANT
GLOVE BIO SURGEON STRL SZ7 (GLOVE) ×3 IMPLANT
GLOVE BIO SURGEON STRL SZ7.5 (GLOVE) ×3 IMPLANT
GLOVE BIOGEL PI IND STRL 7.0 (GLOVE) ×1 IMPLANT
GLOVE BIOGEL PI IND STRL 8 (GLOVE) ×1 IMPLANT
GLOVE BIOGEL PI INDICATOR 7.0 (GLOVE) ×2
GLOVE BIOGEL PI INDICATOR 8 (GLOVE) ×2
GOWN STRL REUS W/ TWL LRG LVL3 (GOWN DISPOSABLE) ×3 IMPLANT
GOWN STRL REUS W/ TWL XL LVL3 (GOWN DISPOSABLE) ×1 IMPLANT
GOWN STRL REUS W/TWL LRG LVL3 (GOWN DISPOSABLE) ×6
GOWN STRL REUS W/TWL XL LVL3 (GOWN DISPOSABLE) ×2
HANDPIECE INTERPULSE COAX TIP (DISPOSABLE) ×2
HOOD PEEL AWAY FLYTE STAYCOOL (MISCELLANEOUS) ×6 IMPLANT
INSERT EPOLY STND HUMERUS 32MM (Shoulder) ×3 IMPLANT
INSERT EPOLYSTD HUMERUS 32MM (Shoulder) ×1 IMPLANT
KIT BASIN OR (CUSTOM PROCEDURE TRAY) ×3 IMPLANT
KIT ROOM TURNOVER OR (KITS) ×3 IMPLANT
MANIFOLD NEPTUNE II (INSTRUMENTS) ×3 IMPLANT
NEEDLE HYPO 25GX1X1/2 BEV (NEEDLE) IMPLANT
NEEDLE MAYO TROCAR (NEEDLE) IMPLANT
NOZZLE PRISM 8.5MM (MISCELLANEOUS) IMPLANT
NS IRRIG 1000ML POUR BTL (IV SOLUTION) ×3 IMPLANT
P2 COATDE GLNOID BSEPLT 6.5X30 (Shoulder) ×3 IMPLANT
PACK SHOULDER (CUSTOM PROCEDURE TRAY) ×3 IMPLANT
PAD ARMBOARD 7.5X6 YLW CONV (MISCELLANEOUS) ×6 IMPLANT
RESTRAINT HEAD UNIVERSAL NS (MISCELLANEOUS) ×3 IMPLANT
RETRIEVER SUT HEWSON (MISCELLANEOUS) ×3 IMPLANT
SCREW BONE LOCKING RSP 5.0X14 (Screw) ×3 IMPLANT
SCREW BONE LOCKING RSP 5.0X30 (Screw) ×3 IMPLANT
SCREW BONE RSP LOCK 5X14 (Screw) ×1 IMPLANT
SCREW BONE RSP LOCK 5X22 (Screw) ×1 IMPLANT
SCREW BONE RSP LOCK 5X26 (Screw) ×1 IMPLANT
SCREW BONE RSP LOCK 5X30 (Screw) ×1 IMPLANT
SCREW BONE RSP LOCKING 5.0X26 (Screw) ×3 IMPLANT
SCREW BONE RSP LOCKING 5.0X32 (Screw) ×3 IMPLANT
SCREW RETAIN W/HEAD 4MM OFFSET (Shoulder) ×3 IMPLANT
SET HNDPC FAN SPRY TIP SCT (DISPOSABLE) ×1 IMPLANT
SLING ARM IMMOBILIZER LRG (SOFTGOODS) ×3 IMPLANT
SLING ARM IMMOBILIZER MED (SOFTGOODS) IMPLANT
SPONGE LAP 18X18 X RAY DECT (DISPOSABLE) ×3 IMPLANT
SPONGE LAP 4X18 X RAY DECT (DISPOSABLE) IMPLANT
STEM HUMERAL REV 14X108 (Shoulder) ×1 IMPLANT
STEM REV PRIMARY 14X108 (Shoulder) ×2 IMPLANT
STRIP CLOSURE SKIN 1/2X4 (GAUZE/BANDAGES/DRESSINGS) ×2 IMPLANT
SUCTION FRAZIER HANDLE 10FR (MISCELLANEOUS) ×2
SUCTION TUBE FRAZIER 10FR DISP (MISCELLANEOUS) ×1 IMPLANT
SUPPORT WRAP ARM LG (MISCELLANEOUS) ×3 IMPLANT
SUT ETHIBOND NAB CT1 #1 30IN (SUTURE) ×6 IMPLANT
SUT MNCRL AB 4-0 PS2 18 (SUTURE) ×3 IMPLANT
SUT SILK 2 0 TIES 17X18 (SUTURE)
SUT SILK 2-0 18XBRD TIE BLK (SUTURE) IMPLANT
SUT VIC AB 0 CTB1 27 (SUTURE) ×3 IMPLANT
SUT VIC AB 2-0 CT1 27 (SUTURE) ×2
SUT VIC AB 2-0 CT1 TAPERPNT 27 (SUTURE) ×1 IMPLANT
SYR CONTROL 10ML LL (SYRINGE) IMPLANT
SYRINGE TOOMEY DISP (SYRINGE) IMPLANT
TAPE FIBER 2MM 7IN #2 BLUE (SUTURE) IMPLANT
TOWEL OR 17X24 6PK STRL BLUE (TOWEL DISPOSABLE) ×3 IMPLANT
TOWEL OR 17X26 10 PK STRL BLUE (TOWEL DISPOSABLE) ×3 IMPLANT
WATER STERILE IRR 1000ML POUR (IV SOLUTION) ×3 IMPLANT
YANKAUER SUCT BULB TIP NO VENT (SUCTIONS) IMPLANT

## 2016-02-26 NOTE — Op Note (Signed)
Procedure(s): REVERSE SHOULDER ARTHROPLASTY Procedure Note  Brenda Duran female 55 y.o. 02/26/2016  Procedure(s) and Anesthesia Type:    *LEFT  REVERSE SHOULDER ARTHROPLASTY - Choice   Indications:  55 y.o. female  With endstage left shoulder arthritis with irrepairable rotator cuff tear. Pain and dysfunction interfered with quality of life and nonoperative treatment with activity modification, NSAIDS and injections failed.     Surgeon: Nita Sells   Assistants: Jeanmarie Hubert PA-C [facility] was present and scrubbed throughout the procedure and was essential in positioning, retraction, exposure, and closure)  Anesthesia: General endotracheal anesthesia     Procedure Detail  REVERSE SHOULDER ARTHROPLASTY   Estimated Blood Loss:  200 mL         Drains: none  Blood Given: none          Specimens: none        Complications:  * No complications entered in OR log *         Disposition: PACU - hemodynamically stable.         Condition: stable      OPERATIVE FINDINGS:  A DJO Altivate pressfit reverse total shoulder arthroplasty was placed with a  size 14 stem, a 32-4 glenosphere, and a standard poly insert. The base plate  fixation was excellent.  PROCEDURE: The patient was identified in the preoperative holding area  where I personally marked the operative site after verifying site, side,  and procedure with the patient. An interscalene block given by  the attending anesthesiologist in the holding area and the patient was taken back to the operating room where all extremities were  carefully padded in position after general anesthesia was induced. She  was placed in a beach-chair position and the operative upper extremity was  prepped and draped in a standard sterile fashion. An approximately 10-  cm incision was made from the tip of the coracoid process to the center  point of the humerus at the level of the axilla. Dissection was carried  down  through subcutaneous tissues to the level of the cephalic vein  which was taken laterally with the deltoid. The pectoralis major was  retracted medially. The subdeltoid space was developed and the lateral  edge of the conjoined tendon was identified. The undersurface of  conjoined tendon was palpated and the musculocutaneous nerve was not in  the field. Retractor was placed underneath the conjoined and second  retractor was placed lateral into the deltoid. The circumflex humeral  artery and vessels were identified and clamped and coagulated. The  biceps tendon was tenotomized.  The subscapularis was deficient.  The  joint was then gently externally rotated while the capsule was released  from the humeral neck around to just beyond the 6 o'clock position. At  this point, the joint was dislocated and the humeral head was presented  into the wound. The excessive osteophyte formation was removed with a  large rongeur.  The cutting guide was used to make the appropriate  head cut and the head was saved for potentially bone grafting.  The glenoid was exposed with the arm in an  abducted extended position. The anterior and posterior labrum were  completely excised and the capsule was released circumferentially to  allow for exposure of the glenoid for preparation. The 2.5 mm drill was  placed using the guide in 5-10 inferior angulation and the tap was then advanced in the same hole. Small and large reamers were then used. The tap was then removed and the  Metaglene was then screwed in with excellent purchase.  The peripheral guide was then used to drilled measured and filled peripheral locking screws. The size  32-4  glenosphere was then impacted on the [facility] taper and the central screw was placed. The humerus was then again exposed and the diaphyseal reamers were used followed by the metaphyseal reamers. The final broach was left in place in the proximal trial was placed. The joint was reduced and with  this implant it was felt that soft tissue tensioning was appropriate with excellent stability and excellent range of motion. Therefore, final humeral stem was placed press-fit with bone grafting.  And then the trial polyethylene inserts were tested again and the above implant was felt to be the most appropriate for final insertion. The joint was reduced taken through full range of motion and felt to be stable. Soft tissue tension was appropriate.  The joint was then copiously irrigated with pulse  lavage and the wound was then closed. The subscapularis was not repaired.  Skin was closed with 2-0 Vicryl in a deep dermal layer and 4-0  Monocryl for skin closure. Steri-Strips were applied. Sterile  dressings were then applied as well as a sling. The patient was allowed  to awaken from general anesthesia, transferred to stretcher, and taken  to recovery room in stable condition.   POSTOPERATIVE PLAN: The patient will be kept in the hospital postoperatively  for pain control and therapy.

## 2016-02-26 NOTE — Anesthesia Preprocedure Evaluation (Addendum)
Anesthesia Evaluation  Patient identified by MRN, date of birth, ID band Patient awake    Reviewed: Allergy & Precautions, NPO status , Patient's Chart, lab work & pertinent test results  Airway Mallampati: II  TM Distance: >3 FB Neck ROM: Full    Dental  (+) Edentulous Upper, Edentulous Lower   Pulmonary sleep apnea , COPD,  COPD inhaler, Current Smoker,     + decreased breath sounds+ wheezing      Cardiovascular hypertension, Pt. on medications  Rhythm:Regular Rate:Normal     Neuro/Psych Seizures -,  PSYCHIATRIC DISORDERS Anxiety Depression  Neuromuscular disease    GI/Hepatic Neg liver ROS, GERD  Medicated,  Endo/Other  negative endocrine ROS  Renal/GU negative Renal ROS  negative genitourinary   Musculoskeletal  (+) Arthritis ,   Abdominal   Peds negative pediatric ROS (+)  Hematology negative hematology ROS (+)   Anesthesia Other Findings   Reproductive/Obstetrics negative OB ROS                           Lab Results  Component Value Date   WBC 8.8 02/18/2016   HGB 16.3* 02/18/2016   HCT 50.8* 02/18/2016   MCV 93.4 02/18/2016   PLT 297 02/18/2016   Lab Results  Component Value Date   CREATININE 0.60 02/18/2016   BUN 18 02/18/2016   NA 141 02/18/2016   K 4.5 02/18/2016   CL 105 02/18/2016   CO2 27 02/18/2016   Lab Results  Component Value Date   INR 1.03 02/18/2016   INR 0.98 01/13/2016   08/2015 EKG: normal sinus rhythm.  Anesthesia Physical Anesthesia Plan  ASA: III  Anesthesia Plan: General   Post-op Pain Management:    Induction: Intravenous  Airway Management Planned: Oral ETT  Additional Equipment:   Intra-op Plan:   Post-operative Plan: Possible Post-op intubation/ventilation  Informed Consent: I have reviewed the patients History and Physical, chart, labs and discussed the procedure including the risks, benefits and alternatives for the proposed  anesthesia with the patient or authorized representative who has indicated his/her understanding and acceptance.   Dental advisory given  Plan Discussed with: CRNA  Anesthesia Plan Comments: (Will not place interscalene block 2/2 significant change in pulmonary status since last procedure per patient. She has not been compliant with home oxygen nor daily albuterol treatments per patient. Patient will receive preop breathing treatment and multimodal therapy for pain relief. Surgeon with supplement with Exparel and local at incision site. )     Anesthesia Quick Evaluation

## 2016-02-26 NOTE — Anesthesia Procedure Notes (Signed)
Procedure Name: Intubation Date/Time: 02/26/2016 8:54 AM Performed by: Shirlyn Goltz Pre-anesthesia Checklist: Patient identified, Emergency Drugs available, Suction available and Patient being monitored Patient Re-evaluated:Patient Re-evaluated prior to inductionOxygen Delivery Method: Circle system utilized Preoxygenation: Pre-oxygenation with 100% oxygen Intubation Type: IV induction Ventilation: Mask ventilation without difficulty and Oral airway inserted - appropriate to patient size Laryngoscope Size: Mac and 3 Grade View: Grade I Tube type: Oral Tube size: 7.0 mm Number of attempts: 1 Airway Equipment and Method: Stylet Placement Confirmation: ETT inserted through vocal cords under direct vision,  positive ETCO2 and breath sounds checked- equal and bilateral Secured at: 20 cm Tube secured with: Tape Dental Injury: Teeth and Oropharynx as per pre-operative assessment

## 2016-02-26 NOTE — Transfer of Care (Signed)
Immediate Anesthesia Transfer of Care Note  Patient: Brenda Duran  Procedure(s) Performed: Procedure(s) with comments: REVERSE SHOULDER ARTHROPLASTY (Left) - Reverse left shoulder arthroplasty  Patient Location: PACU  Anesthesia Type:General  Level of Consciousness: awake, alert , oriented and patient cooperative  Airway & Oxygen Therapy: Patient Spontanous Breathing and Patient connected to face mask oxygen  Post-op Assessment: Report given to RN, Post -op Vital signs reviewed and stable and Patient moving all extremities  Post vital signs: Reviewed and stable  Last Vitals:  Filed Vitals:   02/26/16 0648 02/26/16 1112  BP: 130/92   Pulse: 92   Temp: 36.8 C 37 C  Resp: 20     Last Pain: There were no vitals filed for this visit.    Patients Stated Pain Goal: 5 ([id number])  Complications: No apparent anesthesia complications

## 2016-02-26 NOTE — H&P (Signed)
Brenda Duran is an 55 y.o. female.   Chief Complaint: L shoulder pain and dysfunction HPI: L shoudler severe rotator cuff tear arthropathy.  Failed conservative management with activity modification, injections, medications.   Past Medical History  Diagnosis Date  . Back pain   . Depression   . Hypertension   . COPD (chronic obstructive pulmonary disease) ([place])   . Osteopenia   . Sleep apnea 2013    unsure of results  . Shortness of breath dyspnea   . Pneumonia     [place]- 01/2015  . GERD (gastroesophageal reflux disease)   . Seizures ([place])     6 seizures since 1995, last one 2012, seen in [place], stopeed medicine 3-4 yrs. ago, no seizure since   . Neuromuscular disorder ([place])     shouler RCT's  . Anxiety   . Arthritis     "head to toes"   . Wrist fracture, left 01/2016    Past Surgical History  Procedure Laterality Date  . Foot surgery    . Knee surgery    . Shoulder surgery    . Ankle surgery    . Breast surgery Left 2012    lumpectomy  . Tonsillectomy    . Dilation and curettage of uterus    . Shoulder arthroscopy Right 09/10/2015    Procedure: RIGHT SHOULDER ARTHROSCOPY ;  Surgeon: Dorna Leitz, MD;  Location: [facility];  Service: Orthopedics;  Laterality: Right;  . Shoulder injection Left 09/10/2015    Procedure: LEFT SHOULDER INJECTION;  Surgeon: Dorna Leitz, MD;  Location: [facility];  Service: Orthopedics;  Laterality: Left;    Family History  Problem Relation Age of Onset  . Cancer Mother     leukemia  . Cancer Father     lung  . Colon cancer Neg Hx   . Esophageal cancer Neg Hx   . Rectal cancer Neg Hx   . Stomach cancer Neg Hx    Social History:  reports that she has been smoking Cigarettes.  She has a 17.5 pack-year smoking history. She has never used smokeless tobacco. She reports that she drinks about 1.8 oz of alcohol per week. She reports that she uses illicit drugs (Marijuana and Cocaine).  Allergies: No Known Allergies  Medications Prior to  Admission  Medication Sig Dispense Refill  . albuterol (PROVENTIL) (2.5 MG/3ML) 0.083% nebulizer solution NEBULIZE 1 VIAL EVERY 4 HOURS AS NEEDED 180 mL 5  . albuterol-ipratropium (COMBIVENT) 18-103 MCG/ACT inhaler Inhale 2 puffs into the lungs every 6 (six) hours as needed. wheezing 14.7 g 6  . alendronate (FOSAMAX) 70 MG tablet Take 1 tablet (70 mg total) by mouth every 7 (seven) days. Take with a full glass of water on an empty stomach. 4 tablet 11  . amLODipine (NORVASC) 5 MG tablet Take 1 tablet (5 mg total) by mouth daily before breakfast. 30 tablet 2  . DULoxetine (CYMBALTA) 60 MG capsule 1 capsule daily (Patient taking differently: Take 60 mg by mouth daily. ) 30 capsule 5  . HYDROcodone-acetaminophen (NORCO) 7.5-325 MG tablet Take 1 tablet by mouth every 6 (six) hours as needed for moderate pain.    . indomethacin (INDOCIN) 25 MG capsule TAKE 1 CAPSULE THREE TIMES DAILY AS NEEDED FOR PAIN WITH MEALS 30 capsule 2  . ipratropium (ATROVENT) 0.02 % nebulizer solution NEBULIZE 1 VIAL 4 TIMES A DAY 312.5 mL 5  . methocarbamol (ROBAXIN) 500 MG tablet Take 1-2 tablets (500-1,000 mg total) by mouth 2 (two) times daily. (Patient  taking differently: Take 500-1,000 mg by mouth daily. ) 60 tablet 5  . mometasone-formoterol (DULERA) 100-5 MCG/ACT AERO Inhale 2 puffs into the lungs 2 (two) times daily. 13 g 5  . nicotine (NICODERM CQ - DOSED IN MG/24 HOURS) 21 mg/24hr patch APPLY 1 PATCH ONTO SKIN ONCE DAILY 28 patch 2  . omeprazole (PRILOSEC) 20 MG capsule TAKE  (1)  CAPSULE  TWICE DAILY (TAKE ON AN EMPTY STOMACH AT LEAST 30MIN- UTES BEFORE MEALS). 60 capsule 2  . tiotropium (SPIRIVA HANDIHALER) 18 MCG inhalation capsule Place 1 capsule (18 mcg total) into inhaler and inhale daily. (Patient taking differently: Place 18 mcg into inhaler and inhale daily before breakfast. ) 30 capsule 12  . traZODone (DESYREL) 100 MG tablet Take 100 mg by mouth at bedtime as needed for sleep.       No results found for  this or any previous visit (from the past 48 hour(s)). No results found.  Review of Systems  All other systems reviewed and are negative.   Blood pressure 130/92, pulse 92, temperature 98.2 F (36.8 C), temperature source Oral, resp. rate 20, height 4\' 11"  (1.499 m), weight 65.687 kg (144 lb 13 oz), SpO2 92 %. Physical Exam  Constitutional: She is oriented to person, place, and time. She appears well-developed and well-nourished.  HENT:  Head: Atraumatic.  Eyes: EOM are normal.  Cardiovascular: Intact distal pulses.   Respiratory: Effort normal.  Musculoskeletal:  L shoulder pain with limited ROM.  Neurological: She is alert and oriented to person, place, and time.  Skin: Skin is warm and dry.  Psychiatric: She has a normal mood and affect.     Assessment/Plan: L endstage rotator cuff tear arthropathy Plan L reverse TSA Risks / benefits of surgery discussed Consent on chart  NPO for OR Preop antibiotics   Nita Sells, MD 02/26/2016, 7:25 AM

## 2016-02-27 ENCOUNTER — Encounter (HOSPITAL_COMMUNITY): Payer: Self-pay | Admitting: Orthopedic Surgery

## 2016-02-27 LAB — BASIC METABOLIC PANEL
Anion gap: 9 (ref 5–15)
BUN: 16 mg/dL (ref 6–20)
CHLORIDE: 103 mmol/L (ref 101–111)
CO2: 31 mmol/L (ref 22–32)
CREATININE: 0.59 mg/dL (ref 0.44–1.00)
Calcium: 8.5 mg/dL — ABNORMAL LOW (ref 8.9–10.3)
GFR calc non Af Amer: >60 mL/min (ref >60)
Glucose, Bld: 114 mg/dL — ABNORMAL HIGH (ref 65–99)
POTASSIUM: 4.4 mmol/L (ref 3.5–5.1)
SODIUM: 143 mmol/L (ref 135–145)

## 2016-02-27 LAB — CBC
HEMATOCRIT: 39.3 % (ref 36.0–46.0)
HEMOGLOBIN: 12.1 g/dL (ref 12.0–15.0)
MCH: 30.2 pg (ref 26.0–34.0)
MCHC: 30.8 g/dL (ref 30.0–36.0)
MCV: 98 fL (ref 78.0–100.0)
Platelets: 229 10*3/uL (ref 150–400)
RBC: 4.01 MIL/uL (ref 3.87–5.11)
RDW: 13.8 % (ref 11.5–15.5)
WBC: 9.9 10*3/uL (ref 4.0–10.5)

## 2016-02-27 NOTE — Discharge Summary (Signed)
Patient ID: ANESSA GALANIS MRN: [id number] DOB/AGE: 1961-08-24 55 y.o.  Admit date: 02/26/2016 Discharge date: 02/27/2016  Admission Diagnoses:  Active Problems:   S/p reverse total shoulder arthroplasty   Discharge Diagnoses:  Same  Past Medical History  Diagnosis Date  . Back pain   . Depression   . Hypertension   . COPD (chronic obstructive pulmonary disease) ([place])   . Osteopenia   . Sleep apnea 2013    unsure of results  . Shortness of breath dyspnea   . Pneumonia     [place]- 01/2015  . GERD (gastroesophageal reflux disease)   . Seizures ([place])     6 seizures since 1995, last one 2012, seen in [place], stopeed medicine 3-4 yrs. ago, no seizure since   . 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 Anxiety   . Arthritis     "head to toes"   . Wrist fracture, left 01/2016    Surgeries: Procedure(s): REVERSE SHOULDER ARTHROPLASTY on 02/26/2016   Consultants:    Discharged Condition: Improved  Hospital Course: ARISBEL KEITA is an 55 y.o. female who was admitted 02/26/2016 for operative treatment of left rotator cuff tear arthropathy. Patient has severe unremitting pain that affects sleep, daily activities, and work/hobbies. After pre-op clearance the patient was taken to the operating room on 02/26/2016 and underwent  Procedure(s): [place].    Patient was given perioperative antibiotics: Anti-infectives    Start     Dose/Rate Route Frequency Ordered Stop   02/26/16 1430  ceFAZolin (ANCEF) IVPB 1 g/50 mL premix     1 g 100 mL/hr over 30 Minutes Intravenous Every 6 hours 02/26/16 1324 02/27/16 0246   02/26/16 0638  ceFAZolin (ANCEF) IVPB 2g/100 mL premix     2 g 200 mL/hr over 30 Minutes Intravenous On call to O.R. 02/26/16 [id number] 02/26/16 0859       Patient was given sequential compression devices, early ambulation, and ASA 325 BID to prevent DVT.  Patient benefited maximally from hospital stay and there were no complications.     Recent vital signs: Patient Vitals for the past 24 hrs:  BP Temp Temp src Pulse Resp SpO2  02/27/16 0801 - - - - - 96 %  02/27/16 0500 110/65 mmHg 98.6 F (37 C) Oral 95 16 95 %  02/27/16 0210 - - - - - 94 %  02/27/16 0100 130/80 mmHg 98.1 F (36.7 C) Oral 100 16 90 %  02/26/16 2100 125/82 mmHg 98.1 F (36.7 C) Oral (!) 115 16 (!) 86 %  02/26/16 1548 117/78 mmHg 98.1 F (36.7 C) Oral 88 16 94 %  02/26/16 1248 - - - 89 15 92 %  02/26/16 1245 - - - 88 15 92 %  02/26/16 1230 - 98.5 F (36.9 C) - 87 15 95 %  02/26/16 1226 121/80 mmHg - - 94 14 (!) 88 %  02/26/16 1215 - - - 94 15 93 %  02/26/16 1200 122/88 mmHg - - (!) 102 15 92 %  02/26/16 1156 122/88 mmHg - - 99 17 91 %  02/26/16 1145 132/90 mmHg - - (!) 105 17 90 %  02/26/16 1141 132/90 mmHg - - 96 17 93 %  02/26/16 1130 (!) 137/100 mmHg - - 96 17 97 %  02/26/16 1126 (!) 137/100 mmHg - - 98 18 96 %  02/26/16 1117 (!) 141/90 mmHg - - 98 19 95 %  02/26/16 1115 - - - (!) 101  18 97 %  02/26/16 1112 - 98.6 F (37 C) - - - -  02/26/16 1111 (!) 148/113 mmHg - - (!) 104 15 96 %     Recent laboratory studies:  Recent Labs  02/27/16 0601  WBC 9.9  HGB 12.1  HCT 39.3  PLT 229  NA 143  K 4.4  CL 103  CO2 31  BUN 16  CREATININE 0.59  GLUCOSE 114*  CALCIUM 8.5*     Discharge Medications:     Medication List    STOP taking these medications        indomethacin 25 MG capsule  Commonly known as:  INDOCIN      TAKE these medications        albuterol (2.5 MG/3ML) 0.083% nebulizer solution  Commonly known as:  PROVENTIL  NEBULIZE 1 VIAL EVERY 4 HOURS AS NEEDED     albuterol-ipratropium 18-103 MCG/ACT inhaler  Commonly known as:  COMBIVENT  Inhale 2 puffs into the lungs every 6 (six) hours as needed. wheezing     alendronate 70 MG tablet  Commonly known as:  FOSAMAX  Take 1 tablet (70 mg total) by mouth every 7 (seven) days. Take with a full glass of water on an empty stomach.     amLODipine 5 MG tablet   Commonly known as:  NORVASC  Take 1 tablet (5 mg total) by mouth daily before breakfast.     DULoxetine 60 MG capsule  Commonly known as:  CYMBALTA  1 capsule daily     HYDROcodone-acetaminophen 7.5-325 MG tablet  Commonly known as:  NORCO  Take 1 tablet by mouth every 6 (six) hours as needed for moderate pain.     ipratropium 0.02 % nebulizer solution  Commonly known as:  ATROVENT  NEBULIZE 1 VIAL 4 TIMES A DAY     methocarbamol 500 MG tablet  Commonly known as:  ROBAXIN  Take 1-2 tablets (500-1,000 mg total) by mouth 2 (two) times daily.     mometasone-formoterol 100-5 MCG/ACT Aero  Commonly known as:  DULERA  Inhale 2 puffs into the lungs 2 (two) times daily.     nicotine 21 mg/24hr patch  Commonly known as:  NICODERM CQ - dosed in mg/24 hours  APPLY 1 PATCH ONTO SKIN ONCE DAILY     omeprazole 20 MG capsule  Commonly known as:  PRILOSEC  TAKE  (1)  CAPSULE  TWICE DAILY (TAKE ON AN EMPTY STOMACH AT LEAST 30MIN- UTES BEFORE MEALS).     tiotropium 18 MCG inhalation capsule  Commonly known as:  SPIRIVA HANDIHALER  Place 1 capsule (18 mcg total) into inhaler and inhale daily.     traZODone 100 MG tablet  Commonly known as:  DESYREL  Take 100 mg by mouth at bedtime as needed for sleep.        Diagnostic Studies: Dg Shoulder Left Port  02/26/2016  CLINICAL DATA:  Status post left total shoulder arthroplasty EXAM: LEFT SHOULDER - 1 VIEW COMPARISON:  CT left shoulder dated 12/01/2015 FINDINGS: Status post reverse left shoulder arthroplasty. Associated soft tissue gas. Left lung is clear. IMPRESSION: Status post reverse left shoulder arthroplasty. Electronically Signed   By: Julian Hy M.D.   On: 02/26/2016 12:24    Disposition: 01-Home or Self Care      Discharge Instructions    Call MD / Call 911    Complete by:  As directed   If you experience chest pain or shortness of breath, CALL 911 and be  transported to the hospital emergency room.  If you develope a  fever above 101 F, pus (white drainage) or increased drainage or redness at the wound, or calf pain, call your surgeon's office.     Constipation Prevention    Complete by:  As directed   Drink plenty of fluids.  Prune juice may be helpful.  You may use a stool softener, such as Colace (over the counter) 100 mg twice a day.  Use MiraLax (over the counter) for constipation as needed.     Diet - low sodium heart healthy    Complete by:  As directed      Increase activity slowly as tolerated    Complete by:  As directed               Signed: Grier Mitts 02/27/2016, 8:48 AM

## 2016-02-27 NOTE — Anesthesia Postprocedure Evaluation (Signed)
Anesthesia Post Note  Patient: Brenda Duran  Procedure(s) Performed: Procedure(s) (LRB): REVERSE SHOULDER ARTHROPLASTY (Left)  Patient location during evaluation: PACU Anesthesia Type: General Level of consciousness: awake and alert and patient cooperative Pain management: pain level controlled Vital Signs Assessment: post-procedure vital signs reviewed and stable Respiratory status: spontaneous breathing and respiratory function stable Cardiovascular status: stable Anesthetic complications: no    Last Vitals:  Filed Vitals:   02/27/16 0100 02/27/16 0500  BP: 130/80 110/65  Pulse: 100 95  Temp: 36.7 C 37 C  Resp: 16 16    Last Pain:  Filed Vitals:   02/27/16 0653  PainSc: [place]

## 2016-02-27 NOTE — Progress Notes (Signed)
Occupational Therapy Evaluation Patient Details Name: Brenda Duran MRN: [id number] DOB: 10-08-1961 Today's Date: 02/27/2016    History of Present Illness s/p L reverse TSA   Clinical Impression   PTA, pt had PCA 5 days/wk 6 hrs/day to assist with ADL and IADL tasks. Began education on HEP and compensatory techniques for ADL. Will return to complete education prior to D/C.     Follow Up Recommendations  Supervision - Intermittent;Other (comment) (progress per Dr. Tamera Punt)    Equipment Recommendations  None recommended by OT    Recommendations for Other Services       Precautions / Restrictions Precautions Precautions: Shoulder Type of Shoulder Precautions: conservative Shoulder Interventions: Shoulder sling/immobilizer;Off for dressing/bathing/exercises;At all times Precaution Booklet Issued: Yes (comment) Required Braces or Orthoses: Sling Restrictions Weight Bearing Restrictions: Yes LUE Weight Bearing: Non weight bearing      Mobility Bed Mobility Overal bed mobility: Modified Independent                Transfers Overall transfer level: Modified independent                    Balance Overall balance assessment: No apparent balance deficits (not formally assessed)                                          ADL Overall ADL's : Needs assistance/impaired                                     Functional mobility during ADLs: Modified independent General ADL Comments: see shoulder  section     Vision     Perception     Praxis      Pertinent Vitals/Pain Pain Assessment: 0-10 Pain Score: 7  Pain Location: L shoulder Pain Descriptors / Indicators: Aching Pain Intervention(s): Limited activity within patient's tolerance;Repositioned;Ice applied     Hand Dominance Left   Extremity/Trunk Assessment Upper Extremity Assessment Upper Extremity Assessment: RUE deficits/detail;LUE deficits/detail RUE Deficits /  Details: Apparent RTC dysfunction. @ 20 degrees shoudler ROM actively; painful RUE Coordination: decreased gross motor LUE Deficits / Details: s/p reverse TSA. no A/PROM allowed; elbow/wrist/hand ROM WFL   Lower Extremity Assessment Lower Extremity Assessment:  (c/o B hip pain)       Communication     Cognition Arousal/Alertness: Awake/alert Behavior During Therapy: WFL for tasks assessed/performed Overall Cognitive Status: Within Functional Limits for tasks assessed                     General Comments       Exercises Exercises: Shoulder     Shoulder Instructions Shoulder Instructions Donning/doffing shirt without moving shoulder: Maximal assistance Method for sponge bathing under operated UE: Maximal assistance Donning/doffing sling/immobilizer: Maximal assistance Correct positioning of sling/immobilizer: Maximal assistance ROM for elbow, wrist and digits of operated UE: Minimal assistance Sling wearing schedule (on at all times/off for ADL's): Maximal assistance Proper positioning of operated UE when showering: Maximal assistance Positioning of UE while sleeping: Maximal assistance    Home Living Family/patient expects to be discharged to:: Private residence Living Arrangements: Children Available Help at Discharge: Family Wynonia Lawman - 54 yo special needs son) Type of Home: House Home Access: Stairs to enter Entrance Stairs-Number of Steps: 1   Home Layout: One level  Bathroom Shower/Tub: Tub/shower unit;Curtain Shower/tub characteristics: Architectural technologist: Standard Bathroom Accessibility: Yes How Accessible: Accessible via walker Home Equipment: Bedside commode;Shower seat;Cane - single point;Walker - 2 wheels          Prior Functioning/Environment Level of Independence: Independent;Independent with assistive device(s);Needs assistance (uses a can outside)    ADL's / [place] Needed: Aid helps 5 days/wk 6 hrs/day. Helped with  bathing/dressing        OT Diagnosis: Generalized weakness;Acute pain   OT Problem List: Decreased strength;Decreased range of motion;Decreased coordination;Decreased knowledge of use of DME or AE;Decreased knowledge of precautions;Obesity;Impaired UE functional use;Pain   OT Treatment/Interventions: Self-care/ADL training;Therapeutic exercise;Therapeutic activities;Patient/family education    OT Goals(Current goals can be found in the care plan section) Acute Rehab OT Goals Patient Stated Goal: to use my arm better OT Goal Formulation: With patient Time For Goal Achievement: 03/05/16 Potential to Achieve Goals: Good ADL Goals Pt/caregiver will Perform Home Exercise Program: Left upper extremity;With written HEP provided;Independently (elbow/wrist/hand ROM only) Additional ADL Goal #1: Pt will verbalize understanding of compensatory techniques for ADL and sling management Additional ADL Goal #2: Pt will verbalize understanding of positioning of LUE and edema control  OT Frequency: Min 2X/week   Barriers to D/C: Decreased caregiver support  Lives with special needs son. Has PCA 5 days/wk       Co-evaluation              End of Session Equipment Utilized During Treatment: Gait belt Nurse Communication: Mobility status  Activity Tolerance: Patient tolerated treatment well Patient left: in chair;with call bell/phone within reach;with family/visitor present   Time: [id number] OT Time Calculation (min): 42 min Charges:  OT General Charges $OT Visit: 1 Procedure OT Evaluation $OT Eval Moderate Complexity: 1 Procedure OT Treatments $Self Care/Home Management : 8-22 mins $Therapeutic Exercise: 8-22 mins G-Codes:    Meilech Virts,HILLARY 03/16/2016, 10:31 AM   Maurie Boettcher, OTR/L  [phone] 03-16-2016

## 2016-02-27 NOTE — Progress Notes (Signed)
OT Treatment Note   Ready for D/C. Completed Education as noted below. Pt to continue twith rehab as indicated by Dr. Tamera Punt.     02/27/16 1105  OT Visit Information  Last OT Received On 02/27/16  Assistance Needed +1  History of Present Illness s/p L reverse TSA  Precautions  Precautions Shoulder  Type of Shoulder Precautions conservative  Shoulder Interventions Shoulder sling/immobilizer;Off for dressing/bathing/exercises;At all times  Precaution Booklet Issued Yes (comment)  Required Braces or Orthoses Sling  Pain Assessment  Pain Assessment 0-10  Pain Score 5  Pain Location L shoulder  Pain Descriptors / Indicators Aching  Pain Intervention(s) Limited activity within patient's tolerance;Repositioned;Ice applied  Cognition  Arousal/Alertness Awake/alert  Behavior During Therapy WFL for tasks assessed/performed  Overall Cognitive Status Within Functional Limits for tasks assessed  ADL  General ADL Comments completed education regarding compensatory techniques for ADL and maintaing NWB. Educated pt on how she needs to instruct caregiver to help her with ADL. Written information given. Pt issued reacher and long handled sponge to assist wtih ADL. See belowEducated pt on proper positioning of LUE in sitting and in supine. discussed recommendation of pt sleeping in Optician, dispensing. Pt verbalzied understanding.   Restrictions  LUE Weight Bearing NWB  Shoulder Instructions  Donning/doffing shirt without moving shoulder Patient able to independently direct caregiver  Method for sponge bathing under operated UE Patient able to independently direct caregiver  Donning/doffing sling/immobilizer Patient able to independently direct caregiver  Correct positioning of sling/immobilizer Patient able to independently direct caregiver  ROM for elbow, wrist and digits of operated UE Independent  Sling wearing schedule (on at all times/off for ADL's) Independent  Proper positioning of  operated UE when showering Independent  Positioning of UE while sleeping Independent  OT - End of Session  Activity Tolerance Patient tolerated treatment well  Patient left in chair;with call bell/phone within reach;with family/visitor present  Nurse Communication Other (comment) (ready for D/C)  OT Assessment/Plan  OT Plan Discharge plan remains appropriate;All goals met and education completed, patient discharged from OT services (acute ot)  Follow Up Recommendations Supervision - Intermittent  OT Equipment None recommended by OT  OT Goal Progression  Progress towards OT goals Goals met/education completed, patient discharged from OT  Acute Rehab OT Goals  Patient Stated Goal to use my arm better  OT Goal Formulation All assessment and education complete, DC therapy (acute )  ADL Goals  Pt/caregiver will Perform Home Exercise Program Left upper extremity;With written HEP provided;Independently  Additional ADL Goal #1 Pt will verbalize understanding of compensatory techniques for ADL and sling management  Additional ADL Goal #2 Pt will verbalize understanding of positioning of LUE and edema control  OT Time Calculation  OT Start Time (ACUTE ONLY) 1046  OT Stop Time (ACUTE ONLY) 1105  OT Time Calculation (min) 19 min  OT General Charges  $OT Visit 1 Procedure  OT Treatments  $Self Care/Home Management  8-22 mins  [facility], OTR/L  [phone] 02/27/2016

## 2016-02-27 NOTE — Progress Notes (Signed)
   PATIENT ID: Brenda Duran   1 Day Post-Op Procedure(s) (LRB): REVERSE SHOULDER ARTHROPLASTY (Left)  Subjective: Reports doing well. Minimal pain. Ready to go home today.   Objective:  Filed Vitals:   02/27/16 0100 02/27/16 0500  BP: 130/80 110/65  Pulse: 100 95  Temp: 98.1 F (36.7 C) 98.6 F (37 C)  Resp: 16 16     L UE dressing c/d/i Wiggles fingers, distally NVI  Labs:   Recent Labs  02/27/16 0601  HGB 12.1   Recent Labs  02/27/16 0601  WBC 9.9  RBC 4.01  HCT 39.3  PLT 229   Recent Labs  02/27/16 0601  NA 143  K 4.4  CL 103  CO2 31  BUN 16  CREATININE 0.59  GLUCOSE 114*  CALCIUM 8.5*    Assessment and Plan: 1 day s/p left reverse TSA OT- hand wrist elbow only D/c home today Scripts already given  Fu with Dr. Tamera Punt in 2 weeks  VTE proph: ASA 325mg  BID, SCDs

## 2016-02-27 NOTE — Progress Notes (Signed)
Discharge instructions gave to pt and all questions answered. Pt is  ready to go. 

## 2016-02-27 NOTE — Discharge Instructions (Signed)

## 2016-03-24 ENCOUNTER — Other Ambulatory Visit: Payer: Self-pay | Admitting: Family Medicine

## 2016-03-25 ENCOUNTER — Ambulatory Visit (INDEPENDENT_AMBULATORY_CARE_PROVIDER_SITE_OTHER): Payer: Medicaid Other | Admitting: Pulmonary Disease

## 2016-03-25 ENCOUNTER — Encounter: Payer: Self-pay | Admitting: Pulmonary Disease

## 2016-03-25 VITALS — BP 130/92 | HR 100 | Ht 59.0 in | Wt 141.0 lb

## 2016-03-25 DIAGNOSIS — J439 Emphysema, unspecified: Secondary | ICD-10-CM | POA: Diagnosis not present

## 2016-03-25 MED ORDER — VARENICLINE TARTRATE 0.5 MG X 11 & 1 MG X 42 PO MISC: ORAL | Status: DC

## 2016-03-25 NOTE — Patient Instructions (Signed)
We will start you on chantix for smoking cessation Continue using the dulera and symbicort.  Return in 6 months.

## 2016-03-25 NOTE — Progress Notes (Signed)
Subjective:    Patient ID: Brenda Duran, female    DOB: 06/28/1961, 55 y.o.   MRN: [id number]   PROBLEM LIST: COPD Gold Class III Active smoker.  HPI  Brenda Duran is 55 year old with moderate COPD, active smoker.  She has chronic dyspnea on exertion at baseline. This is worsened by exposure to heat humidity and smoking. His symptoms are accompanied by wheezing and productive cough.  She is on disability. She has a 35 PPD smoking history continues to smoke 1 pack per day. She uses nicotine patches sometimes but is not successful in quitting.   Data: PFTs 05/09/07 FEV1 64% F/F 49% Diffusion capacity 58% No bronchodilator response.  08/21/15 FVC 2.58 [89%] FEV1 1.03 [45%] F/F 40 TLC 128% DLCO 54%. No broncho-dilator response.  CXR (06/17/14) Chronic scoliosis. Chronic inferior right posterior and lateral rib fractures appear stable, and are associated with mild chronic increased peripheral right lung base opacity. Mild irregularity also of left lateral fourth and fifth ribs appear stable. Advanced chronic degenerative changes at the left shoulder. No acute osseous abnormality identified.  Sleep study (03/24/12) AHI-0, RDI-0, desats noted percent on percent unrelated to apnea events.  Past Medical History  Diagnosis Date  . Back pain   . Depression   . Hypertension   . COPD (chronic obstructive pulmonary disease) ([place])   . Osteopenia   . Sleep apnea 2013    unsure of results  . Shortness of breath dyspnea   . Pneumonia     [place]- 01/2015  . GERD (gastroesophageal reflux disease)   . Seizures ([place])     6 seizures since 1995, last one 2012, seen in [place], stopeed medicine 3-4 yrs. ago, no seizure since   . 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 Anxiety   . Arthritis     "head to toes"   . Wrist fracture, left 01/2016    Current outpatient prescriptions:  .  albuterol (PROVENTIL) (2.5 MG/3ML) 0.083% nebulizer solution, NEBULIZE 1 VIAL EVERY 4  HOURS AS NEEDED, Disp: 180 mL, Rfl: 1 .  albuterol-ipratropium (COMBIVENT) 18-103 MCG/ACT inhaler, Inhale 2 puffs into the lungs every 6 (six) hours as needed. wheezing, Disp: 14.7 g, Rfl: 6 .  alendronate (FOSAMAX) 70 MG tablet, Take 1 tablet (70 mg total) by mouth every 7 (seven) days. Take with a full glass of water on an empty stomach., Disp: 4 tablet, Rfl: 11 .  amLODipine (NORVASC) 5 MG tablet, Take 1 tablet (5 mg total) by mouth daily before breakfast., Disp: 30 tablet, Rfl: 2 .  DULoxetine (CYMBALTA) 60 MG capsule, 1 capsule daily (Patient taking differently: Take 60 mg by mouth daily. ), Disp: 30 capsule, Rfl: 5 .  HYDROcodone-acetaminophen (NORCO) 7.5-325 MG tablet, Take 1 tablet by mouth every 6 (six) hours as needed for moderate pain., Disp: , Rfl:  .  ipratropium (ATROVENT) 0.02 % nebulizer solution, NEBULIZE 1 VIAL 4 TIMES A DAY, Disp: 312.5 mL, Rfl: 5 .  methocarbamol (ROBAXIN) 500 MG tablet, Take 1-2 tablets (500-1,000 mg total) by mouth 2 (two) times daily. (Patient taking differently: Take 500-1,000 mg by mouth daily. ), Disp: 60 tablet, Rfl: 5 .  mometasone-formoterol (DULERA) 100-5 MCG/ACT AERO, Inhale 2 puffs into the lungs 2 (two) times daily., Disp: 13 g, Rfl: 5 .  nicotine (NICODERM CQ - DOSED IN MG/24 HOURS) 21 mg/24hr patch, APPLY 1 PATCH ONTO SKIN ONCE DAILY, Disp: 28 patch, Rfl: 2 .  omeprazole (PRILOSEC) 20 MG capsule, TAKE  (  1)  CAPSULE  TWICE DAILY (TAKE ON AN EMPTY STOMACH AT LEAST 30MIN- UTES BEFORE MEALS)., Disp: 60 capsule, Rfl: 2 .  tiotropium (SPIRIVA HANDIHALER) 18 MCG inhalation capsule, Place 1 capsule (18 mcg total) into inhaler and inhale daily. (Patient taking differently: Place 18 mcg into inhaler and inhale daily before breakfast. ), Disp: 30 capsule, Rfl: 12 .  traZODone (DESYREL) 100 MG tablet, Take 100 mg by mouth at bedtime as needed for sleep. , Disp: , Rfl:   Review of Systems  Dyspnea on exertion, cough productive of white sputum, occasional chest  pain. No palpitations She has nasal congestion, sneezing She complains of joint pain and stiffness. No nausea vomiting diarrhea constipation. All other review of systems are negative     Objective:   Physical Exam Blood pressure 130/92, pulse 100, height 4\' 11"  (1.499 m), weight 141 lb (63.957 kg), SpO2 91 %. Gen.: No apparent distress Neuro: No gross focal deficits. Neck: No JVD, lymphadenopathy, thyromegaly. RS: Clear. No wheeze or crackles. CVS: S1-S2 heard, no murmurs rubs gallops. Abdomen: Soft, positive bowel sounds. Extremities: No edema.    Assessment & Plan:   #1 COPD Multiple exacerbations last year. But she is doing better this year after addition of Dulera to Spiriva. She will continue the same.  #2 Smoking I have encouraged her to quit smoking. Nicotine patches are not effective. I will start her on Chantix  #3 nocturnal hypoxemia. Sleep study from 2013 noted. It shows desaturations at night but no OSA. Started on nocturnal oxygen.   Plan: - Continue Dulera and Spiriva. - Start Chantix - Nocturnal O2  Return to clinic in 6 months.  Marshell Garfinkel MD [facility] Pager [phone] If no answer or after 3pm call: [phone] 03/25/2016, 10:36 AM

## 2016-04-15 ENCOUNTER — Encounter: Payer: Self-pay | Admitting: Family Medicine

## 2016-04-15 ENCOUNTER — Ambulatory Visit (INDEPENDENT_AMBULATORY_CARE_PROVIDER_SITE_OTHER): Payer: Medicaid Other | Admitting: Family Medicine

## 2016-04-15 VITALS — BP 109/71 | HR 96 | Temp 97.5°F | Ht 59.0 in | Wt 143.2 lb

## 2016-04-15 DIAGNOSIS — I10 Essential (primary) hypertension: Secondary | ICD-10-CM | POA: Diagnosis not present

## 2016-04-15 DIAGNOSIS — Z720 Tobacco use: Secondary | ICD-10-CM | POA: Insufficient documentation

## 2016-04-15 DIAGNOSIS — J441 Chronic obstructive pulmonary disease with (acute) exacerbation: Secondary | ICD-10-CM

## 2016-04-15 NOTE — Progress Notes (Signed)
   HPI  Patient presents today here to follow-up for hypertension, COPD, tobacco abuse.  Hypertension Taking medication daily Blood pressure averages in the 130s No chest pain, palpitations, leg edema She has severe dyspnea due to COPD.  COPD Patient states symptoms are improved, however she has recently stopped taking Dulera. He is trying to quit smoking, she started Chantix. Actually started going up on her number of cigarettes per day, now three-quarter pack per day. Previously one half pack per day. However she feels that Chantix is helping quite a bit.  We had a long conversation discussing her shoulder pain.   PMH: Smoking status noted ROS: Per HPI  Objective: BP 109/71 mmHg  Pulse 104  Temp(Src) 97.5 F (36.4 C) (Oral)  Ht 4\' 11"  (1.499 m)  Wt 143 lb 3.2 oz (64.955 kg)  BMI 28.91 kg/m2 Gen: NAD, alert, cooperative with exam HEENT: NCAT CV: RRR, good S1/S2, no murmur Resp: Nonlabored, expiratory wheezes scattered, frequent cough Ext: No edema, warm Neuro: Alert and oriented, No gross deficits  Assessment and plan:  # Hypertension Well controlled Continue amlodipine   # COPD Discussed daily use of Dulera and Spiriva Overall I believe she is getting better control.  # Tobacco abuse, smoking Encouraged cessation Continue Chantix   Laroy Apple, MD [facility] 04/15/2016, 9:03 AM

## 2016-04-15 NOTE — Patient Instructions (Signed)
Great to see you!  Be sure to take dulera twice a day and spiriva once a day every day.   Lets plan on seeing you again in 4 months

## 2016-04-19 ENCOUNTER — Other Ambulatory Visit: Payer: Self-pay | Admitting: Orthopedic Surgery

## 2016-04-21 ENCOUNTER — Other Ambulatory Visit: Payer: Self-pay | Admitting: Family Medicine

## 2016-04-21 DIAGNOSIS — J441 Chronic obstructive pulmonary disease with (acute) exacerbation: Secondary | ICD-10-CM

## 2016-04-21 NOTE — Telephone Encounter (Signed)
RX printed and faxed into [facility] per pt request

## 2016-05-04 NOTE — Pre-Procedure Instructions (Signed)
Brenda Duran  05/04/2016      [facility] [place] [address] [place] [address] Phone: [phone] Fax: [phone]    Your procedure is scheduled on Thursday, May 13, 2016  Report to [facility] Admitting at 5:30 A.M.  Call this number if you have problems the morning of surgery:  [phone]   Remember:  Do not eat food or drink liquids after midnight Wednesday, May 12, 2016   Take these medicines the morning of surgery with A SIP OF WATER : amLODipine (NORVASC), DULoxetine (CYMBALTA), omeprazole (PRILOSEC), varenicline (CHANTIX), mometasone-formoterol (DULERA) inhaler, tiotropium (SPIRIVA HANDIHALER), if needed: hydrocodone for pain, Nebulizer treatment for wheezing, albuterol-ipratropium (COMBIVENT) inhaler for wheezing ( bring inhaler in with you on day of surgery)  Stop taking Aspirin, vitamins, fish oil and herbal medications. Do not take any NSAIDs ie: Ibuprofen, Advil, Naproxen, BC and Goody Powder or any medication containing Aspirin.such as Bufferin; stop now.   Do not wear jewelry, make-up or nail polish.  Do not wear lotions, powders, or perfumes.  You may not wear deoderant.  Do not shave 48 hours prior to surgery.    Do not bring valuables to the hospital.  [facility] is not responsible for any belongings or valuables.  Contacts, dentures or bridgework may not be worn into surgery.  Leave your suitcase in the car.  After surgery it may be brought to your room.  For patients admitted to the hospital, discharge time will be determined by your treatment team.  Patients discharged the day of surgery will not be allowed to drive home.   Name and phone number of your driver:  Special instructions:  [facility]- Preparing For Surgery  Before surgery, you can play an important role. Because skin is not sterile, your skin needs to be as free of germs as possible. You can reduce the number of germs on your  skin by washing with CHG (chlorahexidine gluconate) Soap before surgery.  CHG is an antiseptic cleaner which kills germs and bonds with the skin to continue killing germs even after washing.  Please do not use if you have an allergy to CHG or antibacterial soaps. If your skin becomes reddened/irritated stop using the CHG.  Do not shave (including legs and underarms) for at least 48 hours prior to first CHG shower. It is OK to shave your face.  Please follow these instructions carefully.   1. Shower the NIGHT BEFORE SURGERY and the MORNING OF SURGERY with CHG.   2. If you chose to wash your hair, wash your hair first as usual with your normal shampoo.  3. After you shampoo, rinse your hair and body thoroughly to remove the shampoo.  4. Use CHG as you would any other liquid soap. You can apply CHG directly to the skin and wash gently with a scrungie or a clean washcloth.   5. Apply the CHG Soap to your body ONLY FROM THE NECK DOWN.  Do not use on open wounds or open sores. Avoid contact with your eyes, ears, mouth and genitals (private parts). Wash genitals (private parts) with your normal soap.  6. Wash thoroughly, paying special attention to the area where your surgery will be performed.  7. Thoroughly rinse your body with warm water from the neck down.  8. DO NOT shower/wash with your normal soap after using and rinsing off the CHG Soap.  9. Pat yourself dry with a CLEAN  TOWEL.   10. Wear CLEAN PAJAMAS   11. Place CLEAN SHEETS on your bed the night of your first shower and DO NOT SLEEP WITH PETS.    Day of Surgery: Do not apply any deodorants/lotions. Please wear clean clothes to the hospital/surgery center.      Please read over the following fact sheets that you were given. Pain Booklet, Coughing and Deep Breathing, MRSA Information and Surgical Site Infection Prevention

## 2016-05-05 ENCOUNTER — Encounter (HOSPITAL_COMMUNITY)
Admission: RE | Admit: 2016-05-05 | Discharge: 2016-05-05 | Disposition: A | Discharge status: Home / Self Care | Payer: Medicaid Other | Source: Ambulatory Visit | Attending: Orthopedic Surgery | Admitting: Orthopedic Surgery

## 2016-05-05 ENCOUNTER — Encounter (HOSPITAL_COMMUNITY): Payer: Self-pay

## 2016-05-05 DIAGNOSIS — Z01812 Encounter for preprocedural laboratory examination: Secondary | ICD-10-CM | POA: Insufficient documentation

## 2016-05-05 DIAGNOSIS — M75101 Unspecified rotator cuff tear or rupture of right shoulder, not specified as traumatic: Secondary | ICD-10-CM | POA: Insufficient documentation

## 2016-05-05 LAB — COMPREHENSIVE METABOLIC PANEL
ALBUMIN: 3.9 g/dL (ref 3.5–5.0)
ALK PHOS: 89 U/L (ref 38–126)
ALT: 14 U/L (ref 14–54)
ANION GAP: 8 (ref 5–15)
AST: 21 U/L (ref 15–41)
BUN: 11 mg/dL (ref 6–20)
CALCIUM: 9.2 mg/dL (ref 8.9–10.3)
CO2: 27 mmol/L (ref 22–32)
Chloride: 104 mmol/L (ref 101–111)
Creatinine, Ser: 0.54 mg/dL (ref 0.44–1.00)
GFR calc non Af Amer: >60 mL/min (ref >60)
GLUCOSE: 99 mg/dL (ref 65–99)
POTASSIUM: 4 mmol/L (ref 3.5–5.1)
SODIUM: 139 mmol/L (ref 135–145)
TOTAL PROTEIN: 6.6 g/dL (ref 6.5–8.1)
Total Bilirubin: 0.3 mg/dL (ref 0.3–1.2)

## 2016-05-05 LAB — URINALYSIS, ROUTINE W REFLEX MICROSCOPIC
BILIRUBIN URINE: NEGATIVE
Glucose, UA: NEGATIVE mg/dL
HGB URINE DIPSTICK: NEGATIVE
Ketones, ur: NEGATIVE mg/dL
Leukocytes, UA: NEGATIVE
NITRITE: NEGATIVE
PROTEIN: NEGATIVE mg/dL
Specific Gravity, Urine: 1.014 (ref 1.005–1.030)
pH: 6.5 (ref 5.0–8.0)

## 2016-05-05 LAB — CBC WITH DIFFERENTIAL/PLATELET
BASOS PCT: 0 %
Basophils Absolute: 0 10*3/uL (ref 0.0–0.1)
EOS ABS: 0.3 10*3/uL (ref 0.0–0.7)
EOS PCT: 3 %
HCT: 50.3 % — ABNORMAL HIGH (ref 36.0–46.0)
HEMOGLOBIN: 16.1 g/dL — AB (ref 12.0–15.0)
LYMPHS ABS: 3.3 10*3/uL (ref 0.7–4.0)
Lymphocytes Relative: 31 %
MCH: 29.7 pg (ref 26.0–34.0)
MCHC: 32 g/dL (ref 30.0–36.0)
MCV: 92.8 fL (ref 78.0–100.0)
MONO ABS: 0.5 10*3/uL (ref 0.1–1.0)
MONOS PCT: 4 %
Neutro Abs: 6.4 10*3/uL (ref 1.7–7.7)
Neutrophils Relative %: 62 %
Platelets: 256 10*3/uL (ref 150–400)
RBC: 5.42 MIL/uL — ABNORMAL HIGH (ref 3.87–5.11)
RDW: 14.5 % (ref 11.5–15.5)
WBC: 10.5 10*3/uL (ref 4.0–10.5)

## 2016-05-05 LAB — SURGICAL PCR SCREEN
MRSA, PCR: NEGATIVE
STAPHYLOCOCCUS AUREUS: NEGATIVE

## 2016-05-05 LAB — APTT: aPTT: 27 seconds (ref 24–37)

## 2016-05-05 LAB — PROTIME-INR
INR: 1.03 (ref 0.00–1.49)
Prothrombin Time: 13.7 seconds (ref 11.6–15.2)

## 2016-05-05 NOTE — Progress Notes (Signed)
PCP - Wendi Snipes Cardiologist - denies  Chest x-ray - 09/05/15 EKG - 09/05/15 Stress Test - denies ECHO - denies Cardiac Cath - denies  Last use of recreation drugs - "weed in about a week"  Instructed patient to not use anymore between now and the day of her surgery  Patient stated that she uses oxygen at home during the night to help with her breathing     Patient denies shortness of breath, fever, cough and chest pain at PAT appointment

## 2016-05-10 ENCOUNTER — Other Ambulatory Visit: Payer: Self-pay | Admitting: Family Medicine

## 2016-05-12 MED ORDER — CEFAZOLIN SODIUM-DEXTROSE 2-4 GM/100ML-% IV SOLN
2.0000 g | INTRAVENOUS | Status: AC
  Administered 2016-05-13: 2 g via INTRAVENOUS
  Filled 2016-05-12: qty 100

## 2016-05-12 NOTE — Telephone Encounter (Signed)
Rx written and sent back to provider to sign. Will fax to [facility] once returned

## 2016-05-13 ENCOUNTER — Inpatient Hospital Stay (HOSPITAL_COMMUNITY): Payer: Medicaid Other | Admitting: Anesthesiology

## 2016-05-13 ENCOUNTER — Inpatient Hospital Stay (HOSPITAL_COMMUNITY): Payer: Medicaid Other | Admitting: Vascular Surgery

## 2016-05-13 ENCOUNTER — Encounter (HOSPITAL_COMMUNITY): Payer: Self-pay | Admitting: *Deleted

## 2016-05-13 ENCOUNTER — Encounter (HOSPITAL_COMMUNITY)
Admission: RE | Disposition: A | Discharge status: Home / Self Care | Payer: Self-pay | Source: Ambulatory Visit | Attending: Orthopedic Surgery

## 2016-05-13 ENCOUNTER — Inpatient Hospital Stay (HOSPITAL_COMMUNITY): Payer: Medicaid Other

## 2016-05-13 ENCOUNTER — Inpatient Hospital Stay (HOSPITAL_COMMUNITY)
Admission: RE | Admit: 2016-05-13 | Discharge: 2016-05-14 | DRG: 483 | Disposition: A | Discharge status: Home / Self Care | Payer: Medicaid Other | Source: Ambulatory Visit | Attending: Orthopedic Surgery | Admitting: Orthopedic Surgery

## 2016-05-13 DIAGNOSIS — Z96619 Presence of unspecified artificial shoulder joint: Secondary | ICD-10-CM

## 2016-05-13 DIAGNOSIS — F17210 Nicotine dependence, cigarettes, uncomplicated: Secondary | ICD-10-CM | POA: Diagnosis present

## 2016-05-13 DIAGNOSIS — I10 Essential (primary) hypertension: Secondary | ICD-10-CM | POA: Diagnosis present

## 2016-05-13 DIAGNOSIS — Z96611 Presence of right artificial shoulder joint: Secondary | ICD-10-CM

## 2016-05-13 DIAGNOSIS — M25511 Pain in right shoulder: Secondary | ICD-10-CM | POA: Diagnosis present

## 2016-05-13 DIAGNOSIS — F329 Major depressive disorder, single episode, unspecified: Secondary | ICD-10-CM | POA: Diagnosis present

## 2016-05-13 DIAGNOSIS — Z96612 Presence of left artificial shoulder joint: Secondary | ICD-10-CM | POA: Diagnosis present

## 2016-05-13 DIAGNOSIS — M8580 Other specified disorders of bone density and structure, unspecified site: Secondary | ICD-10-CM | POA: Diagnosis present

## 2016-05-13 DIAGNOSIS — K219 Gastro-esophageal reflux disease without esophagitis: Secondary | ICD-10-CM | POA: Diagnosis present

## 2016-05-13 DIAGNOSIS — J449 Chronic obstructive pulmonary disease, unspecified: Secondary | ICD-10-CM | POA: Diagnosis present

## 2016-05-13 DIAGNOSIS — G4730 Sleep apnea, unspecified: Secondary | ICD-10-CM | POA: Diagnosis present

## 2016-05-13 DIAGNOSIS — J9621 Acute and chronic respiratory failure with hypoxia: Secondary | ICD-10-CM | POA: Diagnosis not present

## 2016-05-13 DIAGNOSIS — Z7983 Long term (current) use of bisphosphonates: Secondary | ICD-10-CM

## 2016-05-13 DIAGNOSIS — M19011 Primary osteoarthritis, right shoulder: Secondary | ICD-10-CM | POA: Diagnosis present

## 2016-05-13 HISTORY — PX: REVERSE SHOULDER ARTHROPLASTY: SHX5054

## 2016-05-13 SURGERY — ARTHROPLASTY, SHOULDER, TOTAL, REVERSE
Anesthesia: General | Site: Shoulder | Laterality: Right

## 2016-05-13 MED ORDER — POTASSIUM CHLORIDE IN NACL 20-0.45 MEQ/L-% IV SOLN
INTRAVENOUS | Status: DC
  Administered 2016-05-13: 14:00:00 via INTRAVENOUS
  Filled 2016-05-13 (×2): qty 1000

## 2016-05-13 MED ORDER — METHYLPREDNISOLONE SODIUM SUCC 125 MG IJ SOLR
125.0000 mg | Freq: Every day | INTRAMUSCULAR | Status: AC
  Administered 2016-05-13: 125 mg via INTRAVENOUS

## 2016-05-13 MED ORDER — LIDOCAINE 2% (20 MG/ML) 5 ML SYRINGE
INTRAMUSCULAR | Status: AC
  Filled 2016-05-13: qty 5

## 2016-05-13 MED ORDER — TIOTROPIUM BROMIDE MONOHYDRATE 18 MCG IN CAPS
18.0000 ug | ORAL_CAPSULE | Freq: Every day | RESPIRATORY_TRACT | Status: DC
  Administered 2016-05-13 – 2016-05-14 (×2): 18 ug via RESPIRATORY_TRACT
  Filled 2016-05-13: qty 5

## 2016-05-13 MED ORDER — ALBUTEROL SULFATE (2.5 MG/3ML) 0.083% IN NEBU
INHALATION_SOLUTION | RESPIRATORY_TRACT | Status: AC
  Administered 2016-05-13: 2.5 mg via RESPIRATORY_TRACT
  Filled 2016-05-13: qty 3

## 2016-05-13 MED ORDER — ALUM & MAG HYDROXIDE-SIMETH 200-200-20 MG/5ML PO SUSP: 30.0000 mL | ORAL | Status: DC | PRN

## 2016-05-13 MED ORDER — MENTHOL 3 MG MT LOZG: 1.0000 | LOZENGE | OROMUCOSAL | Status: DC | PRN

## 2016-05-13 MED ORDER — BUPIVACAINE LIPOSOME 1.3 % IJ SUSP
20.0000 mL | INTRAMUSCULAR | Status: AC
  Administered 2016-05-13: 20 mL
  Filled 2016-05-13: qty 20

## 2016-05-13 MED ORDER — VARENICLINE TARTRATE 1 MG PO TABS
1.0000 mg | ORAL_TABLET | Freq: Two times a day (BID) | ORAL | Status: DC
  Administered 2016-05-13 – 2016-05-14 (×2): 1 mg via ORAL
  Filled 2016-05-13 (×2): qty 1

## 2016-05-13 MED ORDER — FENTANYL CITRATE (PF) 100 MCG/2ML IJ SOLN
INTRAMUSCULAR | Status: DC | PRN
  Administered 2016-05-13: 50 ug via INTRAVENOUS
  Administered 2016-05-13 (×2): 25 ug via INTRAVENOUS
  Administered 2016-05-13 (×3): 50 ug via INTRAVENOUS

## 2016-05-13 MED ORDER — PANTOPRAZOLE SODIUM 40 MG PO TBEC
40.0000 mg | DELAYED_RELEASE_TABLET | Freq: Every day | ORAL | Status: DC
  Administered 2016-05-14: 40 mg via ORAL
  Filled 2016-05-13: qty 1

## 2016-05-13 MED ORDER — DIPHENHYDRAMINE HCL 12.5 MG/5ML PO ELIX: 12.5000 mg | ORAL_SOLUTION | ORAL | Status: DC | PRN

## 2016-05-13 MED ORDER — IPRATROPIUM BROMIDE 0.02 % IN SOLN
0.2500 mg | Freq: Four times a day (QID) | RESPIRATORY_TRACT | Status: DC
  Filled 2016-05-13: qty 2.5

## 2016-05-13 MED ORDER — PROPOFOL 10 MG/ML IV BOLUS
INTRAVENOUS | Status: AC
  Filled 2016-05-13: qty 40

## 2016-05-13 MED ORDER — METOCLOPRAMIDE HCL 5 MG PO TABS: 5.0000 mg | ORAL_TABLET | Freq: Three times a day (TID) | ORAL | Status: DC | PRN

## 2016-05-13 MED ORDER — POVIDONE-IODINE 7.5 % EX SOLN: Freq: Once | CUTANEOUS | Status: DC

## 2016-05-13 MED ORDER — SODIUM CHLORIDE 0.9 % IV SOLN
1000.0000 mg | INTRAVENOUS | Status: AC
  Administered 2016-05-13: 1000 mg via INTRAVENOUS
  Filled 2016-05-13: qty 10

## 2016-05-13 MED ORDER — MIDAZOLAM HCL 5 MG/5ML IJ SOLN
INTRAMUSCULAR | Status: DC | PRN
Start: 2016-05-13 — End: 2016-05-13
  Administered 2016-05-13: 2 mg via INTRAVENOUS

## 2016-05-13 MED ORDER — HYDROMORPHONE HCL 1 MG/ML IJ SOLN
0.2500 mg | INTRAMUSCULAR | Status: DC | PRN
  Administered 2016-05-13 (×2): 0.5 mg via INTRAVENOUS

## 2016-05-13 MED ORDER — DEXAMETHASONE SODIUM PHOSPHATE 10 MG/ML IJ SOLN
INTRAMUSCULAR | Status: DC | PRN
  Administered 2016-05-13: 5 mg via INTRAVENOUS

## 2016-05-13 MED ORDER — 0.9 % SODIUM CHLORIDE (POUR BTL) OPTIME
TOPICAL | Status: DC | PRN
  Administered 2016-05-13: 1000 mL

## 2016-05-13 MED ORDER — ALBUTEROL SULFATE HFA 108 (90 BASE) MCG/ACT IN AERS
INHALATION_SPRAY | RESPIRATORY_TRACT | Status: DC | PRN
  Administered 2016-05-13: 2 via RESPIRATORY_TRACT

## 2016-05-13 MED ORDER — METHOCARBAMOL 500 MG PO TABS: 500.0000 mg | ORAL_TABLET | Freq: Every day | ORAL | Status: DC | PRN

## 2016-05-13 MED ORDER — BUPIVACAINE HCL (PF) 0.25 % IJ SOLN
INTRAMUSCULAR | Status: DC | PRN
  Administered 2016-05-13: 20 mL

## 2016-05-13 MED ORDER — ONDANSETRON HCL 4 MG/2ML IJ SOLN: 4.0000 mg | Freq: Four times a day (QID) | INTRAMUSCULAR | Status: DC | PRN

## 2016-05-13 MED ORDER — LACTATED RINGERS IV SOLN
INTRAVENOUS | Status: DC | PRN
  Administered 2016-05-13 (×2): via INTRAVENOUS

## 2016-05-13 MED ORDER — ACETAMINOPHEN 500 MG PO TABS
1000.0000 mg | ORAL_TABLET | Freq: Four times a day (QID) | ORAL | Status: DC
  Administered 2016-05-13 – 2016-05-14 (×2): 1000 mg via ORAL
  Filled 2016-05-13 (×2): qty 2

## 2016-05-13 MED ORDER — ONDANSETRON HCL 4 MG/2ML IJ SOLN
INTRAMUSCULAR | Status: DC | PRN
  Administered 2016-05-13 (×2): 4 mg via INTRAVENOUS

## 2016-05-13 MED ORDER — BISACODYL 5 MG PO TBEC: 5.0000 mg | DELAYED_RELEASE_TABLET | Freq: Every day | ORAL | Status: DC | PRN

## 2016-05-13 MED ORDER — BUPIVACAINE HCL (PF) 0.25 % IJ SOLN
INTRAMUSCULAR | Status: AC
  Filled 2016-05-13: qty 30

## 2016-05-13 MED ORDER — ROCURONIUM BROMIDE 100 MG/10ML IV SOLN
INTRAVENOUS | Status: DC | PRN
Start: 2016-05-13 — End: 2016-05-13
  Administered 2016-05-13: 20 mg via INTRAVENOUS
  Administered 2016-05-13: 40 mg via INTRAVENOUS
  Administered 2016-05-13: 10 mg via INTRAVENOUS

## 2016-05-13 MED ORDER — SUGAMMADEX SODIUM 200 MG/2ML IV SOLN
INTRAVENOUS | Status: DC | PRN
  Administered 2016-05-13: 130 mg via INTRAVENOUS

## 2016-05-13 MED ORDER — ONDANSETRON HCL 4 MG/2ML IJ SOLN: 4.0000 mg | Freq: Once | INTRAMUSCULAR | Status: DC | PRN

## 2016-05-13 MED ORDER — ALBUTEROL SULFATE HFA 108 (90 BASE) MCG/ACT IN AERS
INHALATION_SPRAY | RESPIRATORY_TRACT | Status: AC
  Filled 2016-05-13: qty 6.7

## 2016-05-13 MED ORDER — METOCLOPRAMIDE HCL 5 MG/ML IJ SOLN: 5.0000 mg | Freq: Three times a day (TID) | INTRAMUSCULAR | Status: DC | PRN

## 2016-05-13 MED ORDER — OXYCODONE HCL 5 MG PO TABS: 5.0000 mg | ORAL_TABLET | Freq: Once | ORAL | Status: DC | PRN

## 2016-05-13 MED ORDER — PHENYLEPHRINE 40 MCG/ML (10ML) SYRINGE FOR IV PUSH (FOR BLOOD PRESSURE SUPPORT)
PREFILLED_SYRINGE | INTRAVENOUS | Status: AC
  Filled 2016-05-13: qty 10

## 2016-05-13 MED ORDER — OXYCODONE HCL 5 MG PO TABS: 5.0000 mg | ORAL_TABLET | ORAL | Status: DC | PRN

## 2016-05-13 MED ORDER — DULOXETINE HCL 60 MG PO CPEP
60.0000 mg | ORAL_CAPSULE | Freq: Every day | ORAL | Status: DC
  Administered 2016-05-14: 60 mg via ORAL
  Filled 2016-05-13: qty 1

## 2016-05-13 MED ORDER — DEXAMETHASONE SODIUM PHOSPHATE 10 MG/ML IJ SOLN
INTRAMUSCULAR | Status: AC
  Filled 2016-05-13: qty 1

## 2016-05-13 MED ORDER — ZOLPIDEM TARTRATE 5 MG PO TABS: 5.0000 mg | ORAL_TABLET | Freq: Every evening | ORAL | Status: DC | PRN

## 2016-05-13 MED ORDER — DOCUSATE SODIUM 100 MG PO CAPS
100.0000 mg | ORAL_CAPSULE | Freq: Two times a day (BID) | ORAL | Status: DC
  Administered 2016-05-13 – 2016-05-14 (×3): 100 mg via ORAL
  Filled 2016-05-13 (×3): qty 1

## 2016-05-13 MED ORDER — TRAZODONE HCL 100 MG PO TABS: 100.0000 mg | ORAL_TABLET | Freq: Every evening | ORAL | Status: DC | PRN

## 2016-05-13 MED ORDER — VARENICLINE TARTRATE 0.5 MG X 11 & 1 MG X 42 PO MISC: 1.0000 | Freq: Two times a day (BID) | ORAL | Status: DC

## 2016-05-13 MED ORDER — AMLODIPINE BESYLATE 5 MG PO TABS
5.0000 mg | ORAL_TABLET | Freq: Every day | ORAL | Status: DC
  Administered 2016-05-14: 5 mg via ORAL
  Filled 2016-05-13: qty 1

## 2016-05-13 MED ORDER — MOMETASONE FURO-FORMOTEROL FUM 100-5 MCG/ACT IN AERO
2.0000 | INHALATION_SPRAY | Freq: Two times a day (BID) | RESPIRATORY_TRACT | Status: DC
  Administered 2016-05-13 – 2016-05-14 (×3): 2 via RESPIRATORY_TRACT
  Filled 2016-05-13: qty 8.8

## 2016-05-13 MED ORDER — ROCURONIUM BROMIDE 50 MG/5ML IV SOLN
INTRAVENOUS | Status: AC
  Filled 2016-05-13: qty 1

## 2016-05-13 MED ORDER — PROPOFOL 10 MG/ML IV BOLUS
INTRAVENOUS | Status: DC | PRN
  Administered 2016-05-13: 120 mg via INTRAVENOUS

## 2016-05-13 MED ORDER — PHENOL 1.4 % MT LIQD: 1.0000 | OROMUCOSAL | Status: DC | PRN

## 2016-05-13 MED ORDER — CEFAZOLIN IN D5W 1 GM/50ML IV SOLN
1.0000 g | Freq: Four times a day (QID) | INTRAVENOUS | Status: AC
  Administered 2016-05-13 – 2016-05-14 (×3): 1 g via INTRAVENOUS
  Filled 2016-05-13 (×3): qty 50

## 2016-05-13 MED ORDER — ONDANSETRON HCL 4 MG/2ML IJ SOLN
INTRAMUSCULAR | Status: AC
  Filled 2016-05-13: qty 4

## 2016-05-13 MED ORDER — MIDAZOLAM HCL 2 MG/2ML IJ SOLN
INTRAMUSCULAR | Status: AC
  Filled 2016-05-13: qty 2

## 2016-05-13 MED ORDER — METHOCARBAMOL 500 MG PO TABS
500.0000 mg | ORAL_TABLET | Freq: Every day | ORAL | Status: DC
  Administered 2016-05-14: 500 mg via ORAL
  Filled 2016-05-13: qty 1

## 2016-05-13 MED ORDER — PHENYLEPHRINE HCL 10 MG/ML IJ SOLN
INTRAMUSCULAR | Status: DC | PRN
  Administered 2016-05-13: 40 ug via INTRAVENOUS
  Administered 2016-05-13: 80 ug via INTRAVENOUS
  Administered 2016-05-13: 40 ug via INTRAVENOUS
  Administered 2016-05-13: 80 ug via INTRAVENOUS

## 2016-05-13 MED ORDER — HYDROCODONE-ACETAMINOPHEN 7.5-325 MG PO TABS
1.0000 | ORAL_TABLET | Freq: Four times a day (QID) | ORAL | Status: DC | PRN
  Administered 2016-05-13 – 2016-05-14 (×4): 1 via ORAL
  Filled 2016-05-13 (×4): qty 1

## 2016-05-13 MED ORDER — SODIUM CHLORIDE 0.9 % IJ SOLN
INTRAMUSCULAR | Status: DC | PRN
  Administered 2016-05-13: 20 mL

## 2016-05-13 MED ORDER — ALBUTEROL SULFATE (2.5 MG/3ML) 0.083% IN NEBU
2.5000 mg | INHALATION_SOLUTION | RESPIRATORY_TRACT | Status: DC | PRN
  Administered 2016-05-13 (×3): 2.5 mg via RESPIRATORY_TRACT
  Filled 2016-05-13 (×3): qty 3

## 2016-05-13 MED ORDER — IPRATROPIUM-ALBUTEROL 0.5-2.5 (3) MG/3ML IN SOLN: 3.0000 mL | Freq: Four times a day (QID) | RESPIRATORY_TRACT | Status: DC | PRN

## 2016-05-13 MED ORDER — FLEET ENEMA 7-19 GM/118ML RE ENEM: 1.0000 | ENEMA | Freq: Once | RECTAL | Status: DC | PRN

## 2016-05-13 MED ORDER — IPRATROPIUM BROMIDE 0.02 % IN SOLN
0.2500 mg | Freq: Four times a day (QID) | RESPIRATORY_TRACT | Status: DC
  Administered 2016-05-13 – 2016-05-14 (×2): 0.25 mg via RESPIRATORY_TRACT
  Filled 2016-05-13 (×2): qty 2.5

## 2016-05-13 MED ORDER — POLYETHYLENE GLYCOL 3350 17 G PO PACK: 17.0000 g | PACK | Freq: Every day | ORAL | Status: DC | PRN

## 2016-05-13 MED ORDER — SUGAMMADEX SODIUM 200 MG/2ML IV SOLN
INTRAVENOUS | Status: AC
  Filled 2016-05-13: qty 2

## 2016-05-13 MED ORDER — HYDROMORPHONE HCL 1 MG/ML IJ SOLN
INTRAMUSCULAR | Status: AC
  Filled 2016-05-13: qty 1

## 2016-05-13 MED ORDER — ONDANSETRON HCL 4 MG PO TABS: 4.0000 mg | ORAL_TABLET | Freq: Four times a day (QID) | ORAL | Status: DC | PRN

## 2016-05-13 MED ORDER — MORPHINE SULFATE (PF) 2 MG/ML IV SOLN: 1.0000 mg | INTRAVENOUS | Status: DC | PRN

## 2016-05-13 MED ORDER — METHYLPREDNISOLONE SODIUM SUCC 125 MG IJ SOLR
INTRAMUSCULAR | Status: AC
  Filled 2016-05-13: qty 2

## 2016-05-13 MED ORDER — OXYCODONE HCL 5 MG/5ML PO SOLN: 5.0000 mg | Freq: Once | ORAL | Status: DC | PRN

## 2016-05-13 MED ORDER — ASPIRIN EC 325 MG PO TBEC
325.0000 mg | DELAYED_RELEASE_TABLET | Freq: Every day | ORAL | Status: DC
  Administered 2016-05-13 – 2016-05-14 (×2): 325 mg via ORAL
  Filled 2016-05-13 (×2): qty 1

## 2016-05-13 MED ORDER — FENTANYL CITRATE (PF) 250 MCG/5ML IJ SOLN
INTRAMUSCULAR | Status: AC
  Filled 2016-05-13: qty 5

## 2016-05-13 MED ORDER — LIDOCAINE HCL (CARDIAC) 20 MG/ML IV SOLN
INTRAVENOUS | Status: DC | PRN
  Administered 2016-05-13: 40 mg via INTRAVENOUS

## 2016-05-13 MED ORDER — METHOCARBAMOL 500 MG PO TABS: 500.0000 mg | ORAL_TABLET | Freq: Every day | ORAL | Status: DC

## 2016-05-13 SURGICAL SUPPLY — 72 items
BIT DRILL 5/64X5 DISP (BIT) ×3 IMPLANT
BLADE SAW SAG 73X25 THK (BLADE) ×2
BLADE SAW SGTL 73X25 THK (BLADE) ×1 IMPLANT
BLADE SURG 15 STRL LF DISP TIS (BLADE) ×1 IMPLANT
BLADE SURG 15 STRL SS (BLADE) ×2
BOWL SMART MIX CTS (DISPOSABLE) IMPLANT
CAPT SHLDR REVTOTAL 2 ALTIVATE ×3 IMPLANT
CHLORAPREP W/TINT 26ML (MISCELLANEOUS) ×3 IMPLANT
CLOSURE WOUND 1/2 X4 (GAUZE/BANDAGES/DRESSINGS) ×1
COVER MAYO STAND STRL (DRAPES) IMPLANT
COVER SURGICAL LIGHT HANDLE (MISCELLANEOUS) ×3 IMPLANT
DRAPE INCISE IOBAN 66X45 STRL (DRAPES) ×3 IMPLANT
DRAPE ORTHO SPLIT 77X108 STRL (DRAPES) ×4
DRAPE SURG ORHT 6 SPLT 77X108 (DRAPES) ×2 IMPLANT
DRSG AQUACEL AG ADV 3.5X10 (GAUZE/BANDAGES/DRESSINGS) ×3 IMPLANT
ELECT BLADE 4.0 EZ CLEAN MEGAD (MISCELLANEOUS) ×3
ELECT REM PT RETURN 9FT ADLT (ELECTROSURGICAL) ×3
ELECTRODE BLDE 4.0 EZ CLN MEGD (MISCELLANEOUS) ×1 IMPLANT
ELECTRODE REM PT RTRN 9FT ADLT (ELECTROSURGICAL) ×1 IMPLANT
EVACUATOR 1/8 PVC DRAIN (DRAIN) IMPLANT
GLOVE BIO SURGEON STRL SZ7 (GLOVE) ×3 IMPLANT
GLOVE BIO SURGEON STRL SZ7.5 (GLOVE) ×3 IMPLANT
GLOVE BIOGEL PI IND STRL 7.0 (GLOVE) ×1 IMPLANT
GLOVE BIOGEL PI IND STRL 8 (GLOVE) ×1 IMPLANT
GLOVE BIOGEL PI INDICATOR 7.0 (GLOVE) ×2
GLOVE BIOGEL PI INDICATOR 8 (GLOVE) ×2
GOWN STRL REUS W/ TWL LRG LVL3 (GOWN DISPOSABLE) ×3 IMPLANT
GOWN STRL REUS W/ TWL XL LVL3 (GOWN DISPOSABLE) ×1 IMPLANT
GOWN STRL REUS W/TWL LRG LVL3 (GOWN DISPOSABLE) ×6
GOWN STRL REUS W/TWL XL LVL3 (GOWN DISPOSABLE) ×2
HANDPIECE INTERPULSE COAX TIP (DISPOSABLE)
HOOD PEEL AWAY FLYTE STAYCOOL (MISCELLANEOUS) ×6 IMPLANT
KIT BASIN OR (CUSTOM PROCEDURE TRAY) ×3 IMPLANT
KIT ROOM TURNOVER OR (KITS) ×3 IMPLANT
MANIFOLD NEPTUNE II (INSTRUMENTS) ×3 IMPLANT
NEEDLE 18GX1X1/2 (RX/OR ONLY) (NEEDLE) ×3 IMPLANT
NEEDLE HYPO 25GX1X1/2 BEV (NEEDLE) IMPLANT
NEEDLE MAYO TROCAR (NEEDLE) ×3 IMPLANT
NOZZLE PRISM 8.5MM (MISCELLANEOUS) IMPLANT
NS IRRIG 1000ML POUR BTL (IV SOLUTION) ×3 IMPLANT
PACK SHOULDER (CUSTOM PROCEDURE TRAY) ×3 IMPLANT
PAD ARMBOARD 7.5X6 YLW CONV (MISCELLANEOUS) ×6 IMPLANT
RESTRAINT HEAD UNIVERSAL NS (MISCELLANEOUS) ×3 IMPLANT
RETRIEVER SUT HEWSON (MISCELLANEOUS) ×3 IMPLANT
SET HNDPC FAN SPRY TIP SCT (DISPOSABLE) IMPLANT
SLING ARM FOAM STRAP LRG (SOFTGOODS) ×3 IMPLANT
SLING ARM IMMOBILIZER LRG (SOFTGOODS) IMPLANT
SLING ARM IMMOBILIZER MED (SOFTGOODS) IMPLANT
SPONGE LAP 18X18 X RAY DECT (DISPOSABLE) ×3 IMPLANT
SPONGE LAP 4X18 X RAY DECT (DISPOSABLE) IMPLANT
STRIP CLOSURE SKIN 1/2X4 (GAUZE/BANDAGES/DRESSINGS) ×2 IMPLANT
SUCTION FRAZIER HANDLE 10FR (MISCELLANEOUS) ×2
SUCTION TUBE FRAZIER 10FR DISP (MISCELLANEOUS) ×1 IMPLANT
SUPPORT WRAP ARM LG (MISCELLANEOUS) ×3 IMPLANT
SUT ETHIBOND NAB CT1 #1 30IN (SUTURE) ×6 IMPLANT
SUT MNCRL AB 4-0 PS2 18 (SUTURE) ×3 IMPLANT
SUT SILK 2 0 TIES 17X18 (SUTURE)
SUT SILK 2-0 18XBRD TIE BLK (SUTURE) IMPLANT
SUT VIC AB 0 CTB1 27 (SUTURE) IMPLANT
SUT VIC AB 2-0 CT1 27 (SUTURE) ×2
SUT VIC AB 2-0 CT1 TAPERPNT 27 (SUTURE) ×1 IMPLANT
SYR 50ML LL SCALE MARK (SYRINGE) ×3 IMPLANT
SYR CONTROL 10ML LL (SYRINGE) IMPLANT
SYRINGE TOOMEY DISP (SYRINGE) IMPLANT
SYS SHLDR CAPITATED REV 2 ×1 IMPLANT
TAPE FIBER 2MM 7IN #2 BLUE (SUTURE) ×6 IMPLANT
TOWEL OR 17X24 6PK STRL BLUE (TOWEL DISPOSABLE) ×3 IMPLANT
TOWEL OR 17X26 10 PK STRL BLUE (TOWEL DISPOSABLE) ×3 IMPLANT
TUBE CONNECTING 12'X1/4 (SUCTIONS) ×1
TUBE CONNECTING 12X1/4 (SUCTIONS) ×2 IMPLANT
WATER STERILE IRR 1000ML POUR (IV SOLUTION) ×3 IMPLANT
YANKAUER SUCT BULB TIP NO VENT (SUCTIONS) ×3 IMPLANT

## 2016-05-13 NOTE — Discharge Instructions (Signed)

## 2016-05-13 NOTE — H&P (Signed)
Brenda Duran is an 55 y.o. female.   Chief Complaint: R shoulder pain and dysfunction HPI: 55  Yo with h/o prior surgery and severe endstage rotator cuff tear arthropathy failed conservative management.  Past Medical History:  Diagnosis Date  . Anxiety   . Arthritis    "head to toes"   . Back pain   . COPD (chronic obstructive pulmonary disease) ([place])   . Depression   . GERD (gastroesophageal reflux disease)   . Hypertension   . 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 Osteopenia   . Pneumonia    [place]- 01/2015  . Seizures ([place])    6 seizures since 1995, last one 2012, seen in [place], stopeed medicine 3-4 yrs. ago, no seizure since   . Shortness of breath dyspnea   . Sleep apnea 2013   unsure of results  . Wrist fracture, left 01/2016    Past Surgical History:  Procedure Laterality Date  . ANKLE SURGERY    . BREAST SURGERY Left 2012   lumpectomy  . DILATION AND CURETTAGE OF UTERUS    . FOOT SURGERY    . KNEE SURGERY    . REVERSE SHOULDER ARTHROPLASTY Left 02/26/2016   Procedure: REVERSE SHOULDER ARTHROPLASTY;  Surgeon: Tania Ade, MD;  Location: [facility];  Service: Orthopedics;  Laterality: Left;  Reverse left shoulder arthroplasty  . SHOULDER ARTHROSCOPY Right 09/10/2015   Procedure: RIGHT SHOULDER ARTHROSCOPY ;  Surgeon: Dorna Leitz, MD;  Location: [facility];  Service: Orthopedics;  Laterality: Right;  . SHOULDER INJECTION Left 09/10/2015   Procedure: LEFT SHOULDER INJECTION;  Surgeon: Dorna Leitz, MD;  Location: [facility];  Service: Orthopedics;  Laterality: Left;  . SHOULDER SURGERY    . TONSILLECTOMY      Family History  Problem Relation Age of Onset  . Cancer Mother     leukemia  . Cancer Father     lung  . Colon cancer Neg Hx   . Esophageal cancer Neg Hx   . Rectal cancer Neg Hx   . Stomach cancer Neg Hx    Social History:  reports that she has been smoking Cigarettes.  She has a 35.00 pack-year smoking history. She has never used smokeless  tobacco. She reports that she drinks about 1.8 oz of alcohol per week . She reports that she uses drugs, including Marijuana and Cocaine.  Allergies:  Allergies  Allergen Reactions  . No Known Allergies     Medications Prior to Admission  Medication Sig Dispense Refill  . albuterol (PROVENTIL) (2.5 MG/3ML) 0.083% nebulizer solution NEBULIZE 1 VIAL EVERY 4 HOURS AS NEEDED (Patient taking differently: NEBULIZE 1 VIAL EVERY 4 HOURS AS NEEDED FOR WHEEZING) 180 mL 1  . albuterol-ipratropium (COMBIVENT) 18-103 MCG/ACT inhaler Inhale 2 puffs into the lungs every 6 (six) hours as needed. wheezing 14.7 g 6  . alendronate (FOSAMAX) 70 MG tablet Take 1 tablet (70 mg total) by mouth every 7 (seven) days. Take with a full glass of water on an empty stomach. 4 tablet 11  . amLODipine (NORVASC) 5 MG tablet Take 1 tablet (5 mg total) by mouth daily before breakfast. 30 tablet 2  . DULoxetine (CYMBALTA) 60 MG capsule 1 capsule daily (Patient taking differently: Take 60 mg by mouth daily. ) 30 capsule 5  . HYDROcodone-acetaminophen (NORCO) 7.5-325 MG tablet Take 1 tablet by mouth every 6 (six) hours as needed for moderate pain.    Marland Kitchen ipratropium (ATROVENT) 0.02 % nebulizer solution NEBULIZE 1 VIAL  4 TIMES A DAY 312.5 mL 5  . methocarbamol (ROBAXIN) 500 MG tablet Take 1-2 tablets (500-1,000 mg total) by mouth 2 (two) times daily. (Patient taking differently: Take 500-1,000 mg by mouth daily. ) 60 tablet 5  . mometasone-formoterol (DULERA) 100-5 MCG/ACT AERO Inhale 2 puffs into the lungs 2 (two) times daily. 13 g 5  . omeprazole (PRILOSEC) 20 MG capsule TAKE  (1)  CAPSULE  TWICE DAILY (TAKE ON AN EMPTY STOMACH AT LEAST 30MIN- UTES BEFORE MEALS). 60 capsule 2  . tiotropium (SPIRIVA HANDIHALER) 18 MCG inhalation capsule Place 1 capsule (18 mcg total) into inhaler and inhale daily. (Patient taking differently: Place 18 mcg into inhaler and inhale daily before breakfast. ) 30 capsule 12  . varenicline (CHANTIX PAK) 0.5  MG X 11 & 1 MG X 42 tablet Take one 0.5 mg tab once daily for 3 days, then increase to one 0.5 mg tab BID for 4 days, then increase to one 1 mg tab BID. (Patient taking differently: Take 0.5 mg by mouth 2 (two) times daily. Take one 0.5 mg tab once daily for 3 days, then increase to one 0.5 mg tab BID for 4 days, then increase to one 1 mg tab BID.) 53 tablet 0  . aspirin buffered (BUFFERIN) 325 MG TABS tablet Take 650 mg by mouth daily as needed (headache).    . nicotine (NICODERM CQ - DOSED IN MG/24 HOURS) 21 mg/24hr patch APPLY 1 PATCH ONTO SKIN ONCE DAILY (Patient not taking: Reported on 05/03/2016) 28 patch 2  . traZODone (DESYREL) 100 MG tablet Take 100 mg by mouth at bedtime as needed for sleep.       No results found for this or any previous visit (from the past 48 hour(s)). No results found.  Review of Systems  All other systems reviewed and are negative.   Blood pressure (!) 138/93, pulse 99, temperature 98.2 F (36.8 C), temperature source Oral, resp. rate 18, SpO2 99 %. Physical Exam  Constitutional: She is oriented to person, place, and time. She appears well-developed and well-nourished.  HENT:  Head: Atraumatic.  Eyes: EOM are normal.  Cardiovascular: Intact distal pulses.   Respiratory: Effort normal.  Musculoskeletal:  R shoulder pain with limited ROM.  Neurological: She is alert and oriented to person, place, and time.  Skin: Skin is warm and dry.  Psychiatric: She has a normal mood and affect.     Assessment/Plan R endstage rotator cuff tear arthropathy Plan R reverse TSA Risks / benefits of surgery discussed Consent on chart  NPO for OR Preop antibiotics   Nita Sells, MD 05/13/2016, 7:13 AM

## 2016-05-13 NOTE — Anesthesia Postprocedure Evaluation (Signed)
Anesthesia Post Note  Patient: Brenda Duran  Procedure(s) Performed: Procedure(s) (LRB): REVERSE SHOULDER ARTHROPLASTY (Right)  Patient location during evaluation: PACU Anesthesia Type: General Level of consciousness: awake, awake and alert and oriented Pain management: pain level controlled Vital Signs Assessment: post-procedure vital signs reviewed and stable Respiratory status: spontaneous breathing, nonlabored ventilation and respiratory function stable Cardiovascular status: blood pressure returned to baseline Anesthetic complications: no    Last Vitals:  Vitals:   05/13/16 1057 05/13/16 1100  BP: 137/88   Pulse: (!) 104 99  Resp: 16 15  Temp:      Last Pain:  Vitals:   05/13/16 1015  TempSrc:   PainSc: Asleep                 Erza Mothershead COKER

## 2016-05-13 NOTE — Progress Notes (Signed)
Pt asleep with son at bedside. Continuous pulse ox still intact. Asleep pt now sating 94% on 2 lpm. Pt resting comfortably. Pt easy to awake. Pt exhibits no s/sx resp distress and no c/o such. Will continue to monitor.

## 2016-05-13 NOTE — Transfer of Care (Signed)
Immediate Anesthesia Transfer of Care Note  Patient: Brenda Duran  Procedure(s) Performed: Procedure(s) with comments: REVERSE SHOULDER ARTHROPLASTY (Right) - Right reverse total shoulder arthroplasty  Patient Location: PACU  Anesthesia Type:General  Level of Consciousness:  sedated, patient cooperative and responds to stimulation  Airway & Oxygen Therapy:Patient Spontanous Breathing and Patient connected to face mask oxgen  Post-op Assessment:  Report given to PACU RN and Post -op Vital signs reviewed and stable  Post vital signs:  Reviewed and stable  Last Vitals:  Vitals:   05/13/16 0601 05/13/16 0607  BP: (!) 138/93   Pulse: 99   Resp: 18   Temp:  [id number] C    Complications: No apparent anesthesia complications

## 2016-05-13 NOTE — Progress Notes (Signed)
Rept to Dr. Tamera Punt regarding pt's breathing status, lung assessment and desaturation of oxygen. MD aware pt has had two albuterol neb treatments. Per his order, will give pt a one time dose of IV solumedrol. Will continue to monitor.

## 2016-05-13 NOTE — Progress Notes (Signed)
Pt resting semi fowlers in bed with son at bedside. Pt oxygen titrated to 2 lpm. Oxygen saturation 93% on 2 lpm via [place]. Pt denies SOB. No s/sx resp distress. Pt eating crackers and ice cream. Will continue to monitor.

## 2016-05-13 NOTE — Anesthesia Procedure Notes (Signed)
Procedure Name: Intubation Date/Time: 05/13/2016 7:39 AM Performed by: Freddie Breech Pre-anesthesia Checklist: Patient identified, Emergency Drugs available, Suction available, Patient being monitored and Timeout performed Patient Re-evaluated:Patient Re-evaluated prior to inductionOxygen Delivery Method: Circle System Utilized Preoxygenation: Pre-oxygenation with 100% oxygen Intubation Type: IV induction Ventilation: Mask ventilation without difficulty and Oral airway inserted - appropriate to patient size Laryngoscope Size: Sabra Heck and 2 Grade View: Grade I Tube type: Oral Tube size: 7.0 mm Number of attempts: 1 Airway Equipment and Method: Stylet and Oral airway Placement Confirmation: ETT inserted through vocal cords under direct vision,  positive ETCO2 and breath sounds checked- equal and bilateral Secured at: 21 cm Tube secured with: Tape Dental Injury: Teeth and Oropharynx as per pre-operative assessment

## 2016-05-13 NOTE — Op Note (Signed)
Procedure(s): REVERSE SHOULDER ARTHROPLASTY Procedure Note  MINH HAGMAN female 55 y.o. 05/13/2016  Procedure(s) and Anesthesia Type:    * REVERSE SHOULDER ARTHROPLASTY - Choice   Indications:  55 y.o. female  With endstage right shoulder arthritis with irrepairable rotator cuff tear. Pain and dysfunction interfered with quality of life and nonoperative treatment with activity modification, NSAIDS and injections failed.     Surgeon: Nita Sells   Assistants: Jeanmarie Hubert PA-C [facility] was present and scrubbed throughout the procedure and was essential in positioning, retraction, exposure, and closure)  Anesthesia: General endotracheal anesthesia with preoperative interscalene block    Procedure Detail  Right REVERSE SHOULDER ARTHROPLASTY   Estimated Blood Loss:  300 mL         Drains: none  Blood Given: none          Specimens: none        Complications:  * No complications entered in OR log *         Disposition: PACU - hemodynamically stable.         Condition: stable      OPERATIVE FINDINGS:  A DJO Altivate pressfit reverse total shoulder arthroplasty was placed with a  size 8 stem, a 32-4 glenosphere, and a standard poly insert. The base plate  fixation was fair.  PROCEDURE: The patient was identified in the preoperative holding area  where I personally marked the operative site after verifying site, side,  and procedure with the patient. An interscalene block given by  the attending anesthesiologist in the holding area and the patient was taken back to the operating room where all extremities were  carefully padded in position after general anesthesia was induced. She  was placed in a beach-chair position and the operative upper extremity was  prepped and draped in a standard sterile fashion. An approximately 10-  cm incision was made from the tip of the coracoid process to the center  point of the humerus at the level of the axilla.  Dissection was carried  down through subcutaneous tissues to the level of the cephalic vein  which was taken laterally with the deltoid. The pectoralis major was  retracted medially. The subdeltoid space was developed and the lateral  edge of the conjoined tendon was identified. There is a fair amount of scar tissue in the subdeltoid space. The undersurface of  conjoined tendon was palpated and the musculocutaneous nerve was not in  the field. Retractor was placed underneath the conjoined and second  retractor was placed lateral into the deltoid. The circumflex humeral  artery and vessels were identified and clamped and coagulated. The  biceps tendon was tenotomized.  The subscapularis was taken down as a peel.  The  joint was then gently externally rotated while the capsule was released  from the humeral neck around to just beyond the 6 o'clock position. At  this point, the joint was dislocated and the humeral head was presented  into the wound. The excessive osteophyte formation was removed with a  large rongeur.  The cutting guide was used to make the appropriate  head cut and the head was saved for potentially bone grafting.  The glenoid was exposed with the arm in an  abducted extended position. The anterior and posterior labrum were  completely excised and the capsule was released circumferentially to  allow for exposure of the glenoid for preparation. The 2.5 mm drill was  placed using the guide in 5-10 inferior angulation and the tap was then  advanced in the same hole. Small and large reamers were then used. The tap was then removed and the Metaglene was then screwed in with fair to poor purchase.  The peripheral guide was then used to drilled measured and filled peripheral locking screws. The size  32-4  glenosphere was then impacted on the [facility] taper and the central screw was placed. The humerus was then again exposed and the diaphyseal reamers were used followed by the metaphyseal  reamers. The final broach was left in place in the proximal trial was placed. The joint was reduced and with this implant it was felt that soft tissue tensioning was appropriate with excellent stability and excellent range of motion. Therefore, final humeral stem was placed press-fit with bone grafting.  And then the trial polyethylene inserts were tested again and the above implant was felt to be the most appropriate for final insertion. The joint was reduced taken through full range of motion and felt to be stable. Soft tissue tension was appropriate.  The joint was then copiously irrigated with pulse  lavage and the wound was then closed. The subscapularis was not repaired.  Skin was closed with 2-0 Vicryl in a deep dermal layer and 4-0  Monocryl for skin closure. Steri-Strips were applied. Sterile  dressings were then applied as well as a sling. The patient was allowed  to awaken from general anesthesia, transferred to stretcher, and taken  to recovery room in stable condition.   POSTOPERATIVE PLAN: The patient will be kept in the hospital postoperatively  for pain control and therapy.

## 2016-05-13 NOTE — Anesthesia Preprocedure Evaluation (Addendum)
Anesthesia Evaluation  Patient identified by MRN, date of birth, ID band Patient awake    Reviewed: Allergy & Precautions, NPO status , Patient's Chart, lab work & pertinent test results  Airway Mallampati: II  TM Distance: >3 FB Neck ROM: Full    Dental  (+) Edentulous Upper, Edentulous Lower   Pulmonary Current Smoker,     + wheezing      Cardiovascular  Rhythm:Regular Rate:Normal     Neuro/Psych    GI/Hepatic   Endo/Other    Renal/GU      Musculoskeletal   Abdominal   Peds  Hematology   Anesthesia Other Findings   Reproductive/Obstetrics                            Anesthesia Physical Anesthesia Plan  ASA: III  Anesthesia Plan: General   Post-op Pain Management:    Induction: Intravenous  Airway Management Planned: Oral ETT  Additional Equipment:   Intra-op Plan:   Post-operative Plan: Extubation in OR  Informed Consent: I have reviewed the patients History and Physical, chart, labs and discussed the procedure including the risks, benefits and alternatives for the proposed anesthesia with the patient or authorized representative who has indicated his/her understanding and acceptance.     Plan Discussed with:   Anesthesia Plan Comments: (55 year old female for R. Reverse shoulder arthroplasty. She has significant COPD with end-expiratory wheezing on ausculataion today. She had a previous L. Shoulder arthroplasty on 02/26/16. No interscalene block was performed and exparel was infiltrated for pain control. She reported minimal pain following the surgery. Will forego interscalene block today and use exparel infiltration as used previously.)       Anesthesia Quick Evaluation

## 2016-05-13 NOTE — Significant Event (Signed)
Rapid Response Event Note  Overview: Time Called: 1140 Arrival Time: [id number] Event Type: Respiratory  Initial Focused Assessment: Patient just arrived from PACU.  She is fully alert but feels SOB and is coughing frequently. She is sitting on the side of the bed in some distress.  Lung sounds are "tight" but she is moving air.  She received an albuterol treatment in the PACU. O2 sats on 2L are 75-85%  Interventions: Additional Albuterol treatment given Patient much improved O2 sats on 2l still 86% O2 increased to 4L [place] O2 sats now 92-94% Dr Tamera Punt notified, one time dose of solumedrol given. Patient able to ambulate to the bathroom and then able lay back in the bed.   Plan of Care (if not transferred): RN to call back if assistance needed RN will wean O2 as patient tolerates  Event Summary: Name of Physician Notified: Tamera Punt at 1215    at    Outcome: Stayed in room and stabalized  Event End Time: [place]  Raliegh Ip

## 2016-05-13 NOTE — Progress Notes (Signed)
After breathing treatment and IV solumedrol, pt oxy sat 94% 2 lpm. Pt states, "I feel better". Will continue to monitor. Cont pulse ox monitoring continues.

## 2016-05-13 NOTE — Progress Notes (Addendum)
  Pt admitted via hospital bed from PACU to room 5n06. Pt transferred to unit on RA. Pt very sleepy and arousable with physical touch and verbal stimulation.  Pt has h/o COPD. O2 on RA 83%. Auscultation of pt's lungs noted to be very diminished throughout with some fine wheezes in the upper lobes. Minimal air moving with inhalation and expiration. Per rept, pt received an albuterol neb treatment in PACU at 1030 AM. Pt noted to be tachy in the low 100's. Pt placed on oxygen at 6 lpm via [place]. Pt aroused and instructed to take deep breaths and cough them out. Pt repts being SOB and wears oxygen at 2 lpm at night at home. Pt oxygen sat up to 87% on 6 lpm. Continuous pulse ox applied. Pt oxygen noted to trend downward when she drifts off to sleep. Pt assisted to high fowlers in bed and assisted with IS which instructed pt to cough. Pt noted to have forceful cough and coughed up moderate amount of clear thick phlegm. Pt is still a smoker. Pt asks to sit on edge of bed. Pt assisted to edge of bed. Pt able to further cough and perform IS. At 1140, rapid response called to room. Pt oxygen sat when not coughing ranging from 80-88% on 6 lpm. Pt repts that her breathing is "better" when she isn't coughing. Resp called. Pt more arousable. Will give neb and continue to monitor.

## 2016-05-14 ENCOUNTER — Encounter (HOSPITAL_COMMUNITY): Payer: Self-pay | Admitting: Orthopedic Surgery

## 2016-05-14 MED ORDER — DOCUSATE SODIUM 100 MG PO CAPS: 100.0000 mg | ORAL_CAPSULE | Freq: Three times a day (TID) | ORAL | 0 refills | Status: DC | PRN

## 2016-05-14 MED ORDER — IPRATROPIUM BROMIDE 0.02 % IN SOLN: 0.5000 mg | Freq: Two times a day (BID) | RESPIRATORY_TRACT | Status: DC

## 2016-05-14 MED ORDER — HYDROCODONE-ACETAMINOPHEN 7.5-325 MG PO TABS: 1.0000 | ORAL_TABLET | ORAL | 0 refills | Status: DC | PRN

## 2016-05-14 NOTE — Progress Notes (Signed)
   PATIENT ID: Brenda Duran   1 Day Post-Op Procedure(s) (LRB): REVERSE SHOULDER ARTHROPLASTY (Right)  Subjective: Doing well this am. Comfortable. No further episodes of resp distress after RR call yesterday, responded well to albuterol and solumedrol. Feels okay to go home.   Objective:  Vitals:   05/14/16 0030 05/14/16 0400  BP: 121/90 120/89  Pulse: 89 72  Resp: 15 16  Temp: 98.3 F (36.8 C) 98.8 F (37.1 C)     R UE dressing c/d/i Wiggles fingers, distally NVI  Labs:  No results for input(s): HGB in the last 72 hours.No results for input(s): WBC, RBC, HCT, PLT in the last 72 hours.No results for input(s): NA, K, CL, CO2, BUN, CREATININE, GLUCOSE, CALCIUM in the last 72 hours.  Assessment and Plan: 1 day s/p right reverse TSA Doing well after resp distress event yesterday afternoon, [place] O2 today and tolerating it well D/c home today, norco for pain control  Fu with Dr. Tamera Punt in 2 weeks  VTE proph: ASA 325mg , SCDs

## 2016-05-14 NOTE — Discharge Summary (Signed)
Patient ID: Brenda Duran MRN: [id number] DOB/AGE: Jan 02, 1961 55 y.o.  Admit date: 05/13/2016 Discharge date: 05/14/2016  Admission Diagnoses:  Active Problems:   S/p reverse total shoulder arthroplasty   Discharge Diagnoses:  Same  Past Medical History:  Diagnosis Date  . Anxiety   . Arthritis    "head to toes"   . Back pain   . COPD (chronic obstructive pulmonary disease) ([place])   . Depression   . GERD (gastroesophageal reflux disease)   . Hypertension   . Neuromuscular disorder ([place])    shouler RCT's  . Osteopenia   . Pneumonia    [place]- 01/2015  . Seizures ([place])    6 seizures since 1995, last one 2012, seen in [place], stopeed medicine 3-4 yrs. ago, no seizure since   . Shortness of breath dyspnea   . Sleep apnea 2013   unsure of results  . Wrist fracture, left 01/2016    Surgeries: Procedure(s): REVERSE SHOULDER ARTHROPLASTY on 05/13/2016   Consultants:   Discharged Condition: Improved  Hospital Course: SEAIRRA WALLETT is an 55 y.o. female who was admitted 05/13/2016 for operative treatment of right rotator cuff tear arthropathy. Patient has severe unremitting pain that affects sleep, daily activities, and work/hobbies. After pre-op clearance the patient was taken to the operating room on 05/13/2016 and underwent  Procedure(s): [place].    Patient was given perioperative antibiotics: Anti-infectives    Start     Dose/Rate Route Frequency Ordered Stop   05/13/16 1315  ceFAZolin (ANCEF) IVPB 1 g/50 mL premix     1 g 100 mL/hr over 30 Minutes Intravenous Every 6 hours 05/13/16 1211 05/14/16 0130   05/13/16 0700  ceFAZolin (ANCEF) IVPB 2g/100 mL premix     2 g 200 mL/hr over 30 Minutes Intravenous To ShortStay Surgical 05/12/16 1109 05/13/16 0800       Patient was given sequential compression devices, early ambulation, and ASA 325mg  to prevent DVT.  Patient benefited maximally from hospital stay. She had an episode of acute on chronic  respiratory failure following surgery. Rapid response treated with  albuterol and solumedrol and she stabilized well with no further episodes.   Recent vital signs: Patient Vitals for the past 24 hrs:  BP Temp Temp src Pulse Resp SpO2  05/14/16 1100 - - - - - 90 %  05/14/16 1006 - - - - - 91 %  05/14/16 0754 - - - - - 93 %  05/14/16 0400 120/89 98.8 F (37.1 C) Oral 72 16 -  05/14/16 0030 121/90 98.3 F (36.8 C) Oral 89 15 95 %  05/13/16 2320 - - - - - 94 %  05/13/16 2045 - - - - - 94 %  05/13/16 2044 - - - - - 94 %  05/13/16 2017 125/80 98.1 F (36.7 C) Oral 91 15 95 %  05/13/16 1810 (!) 146/87 98.1 F (36.7 C) Oral 100 16 92 %  05/13/16 1600 - - - - - 93 %  05/13/16 1553 - - - - - 91 %  05/13/16 1244 - - - - - 94 %  05/13/16 1219 (!) 153/88 - - (!) 110 - 92 %  05/13/16 1212 - - - - - (!) 86 %     Recent laboratory studies: No results for input(s): WBC, HGB, HCT, PLT, NA, K, CL, CO2, BUN, CREATININE, GLUCOSE, INR, CALCIUM in the last 72 hours.  Invalid input(s): PT, 2   Discharge Medications:     Medication List  TAKE these medications   albuterol (2.5 MG/3ML) 0.083% nebulizer solution Commonly known as:  PROVENTIL NEBULIZE 1 VIAL EVERY 4 HOURS AS NEEDED What changed:  See the new instructions.   albuterol-ipratropium 18-103 MCG/ACT inhaler Commonly known as:  COMBIVENT Inhale 2 puffs into the lungs every 6 (six) hours as needed. wheezing   alendronate 70 MG tablet Commonly known as:  FOSAMAX Take 1 tablet (70 mg total) by mouth every 7 (seven) days. Take with a full glass of water on an empty stomach.   amLODipine 5 MG tablet Commonly known as:  NORVASC Take 1 tablet (5 mg total) by mouth daily before breakfast.   BUFFERIN 325 MG Tabs tablet Generic drug:  aspirin buffered Take 650 mg by mouth daily as needed (headache).   docusate sodium 100 MG capsule Commonly known as:  COLACE Take 1 capsule (100 mg total) by mouth 3 (three) times daily as needed.    DULoxetine 60 MG capsule Commonly known as:  CYMBALTA 1 capsule daily What changed:  how much to take  how to take this  when to take this  additional instructions   HYDROcodone-acetaminophen 7.5-325 MG tablet Commonly known as:  NORCO Take 1-2 tablets by mouth every 4 (four) hours as needed for moderate pain. What changed:  how much to take  when to take this   ipratropium 0.02 % nebulizer solution Commonly known as:  ATROVENT NEBULIZE 1 VIAL 4 TIMES A DAY   methocarbamol 500 MG tablet Commonly known as:  ROBAXIN Take 1-2 tablets (500-1,000 mg total) by mouth 2 (two) times daily. What changed:  when to take this   mometasone-formoterol 100-5 MCG/ACT Aero Commonly known as:  DULERA Inhale 2 puffs into the lungs 2 (two) times daily.   nicotine 21 mg/24hr patch Commonly known as:  NICODERM CQ - dosed in mg/24 hours APPLY 1 PATCH ONTO SKIN ONCE DAILY   omeprazole 20 MG capsule Commonly known as:  PRILOSEC TAKE  (1)  CAPSULE  TWICE DAILY (TAKE ON AN EMPTY STOMACH AT LEAST 30MIN- UTES BEFORE MEALS).   tiotropium 18 MCG inhalation capsule Commonly known as:  SPIRIVA HANDIHALER Place 1 capsule (18 mcg total) into inhaler and inhale daily. What changed:  when to take this   traZODone 100 MG tablet Commonly known as:  DESYREL Take 100 mg by mouth at bedtime as needed for sleep.   varenicline 0.5 MG X 11 & 1 MG X 42 tablet Commonly known as:  CHANTIX PAK Take one 0.5 mg tab once daily for 3 days, then increase to one 0.5 mg tab BID for 4 days, then increase to one 1 mg tab BID. What changed:  how much to take  how to take this  when to take this  additional instructions       Diagnostic Studies: Dg Shoulder Right Port  Result Date: 05/13/2016 CLINICAL DATA:  S/p reverse total arthroplasty. PACU post op EXAM: PORTABLE RIGHT SHOULDER - 2+ VIEW COMPARISON:  11/15/2015 FINDINGS: Status post reverse shoulder arthroplasty. Postoperative gas is identified in the  region the shoulder. The humeral component appears well seated over the glenoid component on the frontal view provided. No acute fracture. IMPRESSION: Interval reverse shoulder arthroplasty. No adverse features identified. Electronically Signed   By: Nolon Nations M.D.   On: 05/13/2016 10:29   Disposition: 01-Home or Self Care  Discharge Instructions    Call MD / Call 911    Complete by:  As directed   If you experience chest  pain or shortness of breath, CALL 911 and be transported to the hospital emergency room.  If you develope a fever above 101 F, pus (white drainage) or increased drainage or redness at the wound, or calf pain, call your surgeon's office.   Constipation Prevention    Complete by:  As directed   Drink plenty of fluids.  Prune juice may be helpful.  You may use a stool softener, such as Colace (over the counter) 100 mg twice a day.  Use MiraLax (over the counter) for constipation as needed.   Diet - low sodium heart healthy    Complete by:  As directed   Increase activity slowly as tolerated    Complete by:  As directed      Follow-up Information    Nita Sells, MD. Schedule an appointment as soon as possible for a visit in 2 weeks.   Specialty:  Orthopedic Surgery Contact information: [place] [address] [phone]            Signed: Grier Mitts 05/14/2016, 12:03 PM

## 2016-05-14 NOTE — Progress Notes (Signed)
Pt stable for d/c home today per MD. Respiratory issues seem resolved, her O2 sats are 89-91% on RA. No SOB reported. She met all OT goals, has no home health needs. Discharge instructions and prescriptions reviewed with pt, teach back was used to ensure understanding. Transportation home was set up with [place] transportation for 11:30. She was assisted out by NT.   Brenda Duran, Jerry Caras

## 2016-05-14 NOTE — Evaluation (Signed)
Occupational Therapy Evaluation and Discharge  Patient Details Name: Brenda Duran MRN: [id number] DOB: 26-Sep-1961 Today's Date: 05/14/2016    History of Present Illness s/p R reverse TSA PMH: L reverse TSA earlier this year and COPD   Clinical Impression   All education completed as detailed below. Pt with excellent recall from recent L reverse TSA. Pt moving well with pain managed. Eager to shower and go home. Will have assist of son and aide at home. No further OT needs.  Follow Up Recommendations  No OT follow up    Equipment Recommendations  None recommended by OT    Recommendations for Other Services       Precautions / Restrictions Precautions Precautions: Shoulder Type of Shoulder Precautions: conservative protocol Shoulder Interventions: Shoulder sling/immobilizer;Off for dressing/bathing/exercises Precaution Booklet Issued: Yes (comment) Precaution Comments: instructed in no exercises for R shoulder and NWB status Required Braces or Orthoses: Sling Restrictions Weight Bearing Restrictions: Yes RUE Weight Bearing: Non weight bearing      Mobility Bed Mobility Overal bed mobility: Modified Independent             General bed mobility comments: from flat bed, toward L, use of rail  Transfers Overall transfer level: Independent Equipment used: None                  Balance                                            ADL Overall ADL's : At baseline                                       General ADL Comments: Pt aware of compensatory strategies for ADL, sling donning and doffing and wearing schedule, positioning R UE in bed and chair.      Vision     Perception     Praxis      Pertinent Vitals/Pain Pain Assessment: Faces Faces Pain Scale: Hurts a little bit Pain Location: R shoulder Pain Descriptors / Indicators: Guarding Pain Intervention(s): Monitored during session;Premedicated before  session;Repositioned;Ice applied     Hand Dominance Left   Extremity/Trunk Assessment Upper Extremity Assessment Upper Extremity Assessment: RUE deficits/detail RUE Deficits / Details: performed AROM elbow, forearm, wrist and hand x 10 RUE: Unable to fully assess due to immobilization RUE Coordination: decreased gross motor   Lower Extremity Assessment Lower Extremity Assessment: Overall WFL for tasks assessed       Communication Communication Communication: No difficulties   Cognition Arousal/Alertness: Awake/alert Behavior During Therapy: WFL for tasks assessed/performed Overall Cognitive Status: Within Functional Limits for tasks assessed                     General Comments       Exercises       Shoulder Instructions      Home Living Family/patient expects to be discharged to:: Private residence Living Arrangements: Children Available Help at Discharge: Family;Available 24 hours/day Type of Home: House Home Access: Stairs to enter [employer] of Steps: 1   Home Layout: One level     Bathroom Shower/Tub: Tub/shower unit;Curtain Shower/tub characteristics: Architectural technologist: Standard     Home Equipment: Bedside commode;Shower seat;Cane - single point;Walker - 2 wheels  Prior Functioning/Environment Level of Independence: Needs assistance  Gait / Transfers Assistance Needed: walks without device ADL's / Homemaking Assistance Needed: Aid helps 5 days/wk 6 hrs/day. Helped with bathing/dressing        OT Diagnosis: Acute pain;Generalized weakness   OT Problem List:     OT Treatment/Interventions:      OT Goals(Current goals can be found in the care plan section) Acute Rehab OT Goals Patient Stated Goal: get a shower and go home  OT Frequency:     Barriers to D/C:            Co-evaluation              End of Session Equipment Utilized During Treatment: Oxygen Nurse Communication: Mobility status (ready  for d/c)  Activity Tolerance: Patient tolerated treatment well Patient left: in bed;with call bell/phone within reach;with bed alarm set;with family/visitor present   Time: [id number] OT Time Calculation (min): 24 min Charges:  OT General Charges $OT Visit: 1 Procedure OT Evaluation $OT Eval Moderate Complexity: 1 Procedure OT Treatments $Therapeutic Activity: 8-22 mins G-Codes:    Malka So 05/14/2016, 9:06 AM [phone]

## 2016-05-21 ENCOUNTER — Telehealth: Payer: Self-pay | Admitting: Family Medicine

## 2016-05-21 DIAGNOSIS — M25511 Pain in right shoulder: Secondary | ICD-10-CM

## 2016-05-21 NOTE — Telephone Encounter (Signed)
Referral to pain clinic ordered

## 2016-06-03 ENCOUNTER — Other Ambulatory Visit: Payer: Self-pay | Admitting: Family Medicine

## 2016-06-04 ENCOUNTER — Telehealth: Payer: Self-pay | Admitting: *Deleted

## 2016-06-04 MED ORDER — TRAZODONE HCL 100 MG PO TABS: 100.0000 mg | ORAL_TABLET | Freq: Every evening | ORAL | 3 refills | Status: AC | PRN

## 2016-06-04 NOTE — Telephone Encounter (Signed)
Refilled trazodone, discussed with patient.  Laroy Apple, MD [facility] 06/04/2016, 5:14 PM

## 2016-06-11 ENCOUNTER — Telehealth: Payer: Self-pay | Admitting: Family Medicine

## 2016-06-15 ENCOUNTER — Other Ambulatory Visit: Payer: Self-pay | Admitting: Family Medicine

## 2016-06-15 DIAGNOSIS — F32A Depression, unspecified: Secondary | ICD-10-CM

## 2016-06-15 DIAGNOSIS — F329 Major depressive disorder, single episode, unspecified: Secondary | ICD-10-CM

## 2016-06-17 NOTE — Telephone Encounter (Signed)
Rx handwritten and faxed over to [facility], pt is aware.

## 2016-06-18 ENCOUNTER — Ambulatory Visit (INDEPENDENT_AMBULATORY_CARE_PROVIDER_SITE_OTHER): Payer: Medicaid Other | Admitting: Family Medicine

## 2016-06-18 ENCOUNTER — Encounter: Payer: Self-pay | Admitting: Family Medicine

## 2016-06-18 VITALS — BP 114/80 | HR 85 | Temp 98.9°F | Ht 59.0 in | Wt 141.6 lb

## 2016-06-18 DIAGNOSIS — Z720 Tobacco use: Secondary | ICD-10-CM

## 2016-06-18 DIAGNOSIS — G8929 Other chronic pain: Secondary | ICD-10-CM

## 2016-06-18 DIAGNOSIS — R262 Difficulty in walking, not elsewhere classified: Secondary | ICD-10-CM | POA: Diagnosis not present

## 2016-06-18 DIAGNOSIS — M545 Low back pain, unspecified: Secondary | ICD-10-CM

## 2016-06-18 DIAGNOSIS — R2681 Unsteadiness on feet: Secondary | ICD-10-CM | POA: Diagnosis not present

## 2016-06-18 NOTE — Progress Notes (Signed)
   HPI  Patient presents today here for prescription for rolling walker.  Patient explains that for over a year she's had difficulty walking and with gait instability. She has previously been approved and given a hover-round Her shoulder issues prevent her from using it appropriately.  She states that she has dizziness and near falls, she denies any recent falls.  The dizziness is only when she is first standing and then goes away. No room spinning sensation.  Tobacco abuse Trying to quit, taking Chantix.  Has chronic low back pain as well- willing to go to physical therapy.  PMH: Smoking status noted ROS: Per HPI  Objective: BP 114/80   Pulse 85   Temp 98.9 F (37.2 C) (Oral)   Ht 4\' 11"  (1.499 m)   Wt 141 lb 9.6 oz (64.2 kg)   BMI 28.60 kg/m  Gen: NAD, alert, cooperative with exam HEENT: NCAT CV: RRR, good S1/S2, no murmur Resp: CTABL, no wheezes, non-labored Ext: No edema, warm Neuro: Alert and oriented, No gross deficits  Stands easily in moments wihout any dizziness or loss of balance apparent  Assessment and plan:  # Gait Instability, difficulty walking Likely multifactorial from chronic back pain and deconditioning. She's unable to use her motorized wheelchair due to shoulder pain and recent shoulder replacements. Rx given for rolling walker with a seat, It was faxed to her pharmacy. PT for gait training and walker trianing  # Tobacco abuse Trying to quit using chantix, encouraged to continue'   # chronic low back pain, chronic pain syndrome Patient seems to be coping well currently I have referred her to physical therapy for primarily gait instability and difficulty walking, however if low back pain can be helped it would be excellent improvement for her. Continue to monitor, stable   Orders Placed This Encounter  Procedures  . Ambulatory referral to Physical Therapy    Referral Priority:   Routine    Referral Type:   Physical Medicine    Referral  Reason:   Specialty Services Required    Requested Specialty:   Physical Therapy    Number of Visits Requested:   1    No orders of the defined types were placed in this encounter.   Laroy Apple, MD [facility] 06/18/2016, 9:21 AM

## 2016-06-18 NOTE — Patient Instructions (Addendum)
Great to see you!  You will be called for physical therapy

## 2016-06-30 ENCOUNTER — Ambulatory Visit: Payer: Medicaid Other | Admitting: Family Medicine

## 2016-07-05 ENCOUNTER — Ambulatory Visit: Payer: Medicaid Other | Attending: Orthopedic Surgery | Admitting: Physical Therapy

## 2016-07-05 ENCOUNTER — Encounter: Payer: Self-pay | Admitting: Physical Therapy

## 2016-07-05 DIAGNOSIS — M25611 Stiffness of right shoulder, not elsewhere classified: Secondary | ICD-10-CM

## 2016-07-05 DIAGNOSIS — M25511 Pain in right shoulder: Secondary | ICD-10-CM

## 2016-07-05 NOTE — Patient Instructions (Signed)
SHOULDER: External Rotation - Supine (Cane)  Place a rolled towel under arm to keep elbow at side. Hold cane with both hands. Rotate arm away from body. Keep elbow on floor and next to body. _10-20__ reps per set, 2-3___ sets per day,      Cane Exercise: Flexion  Chest Press: Hold cane with both hands at chest level and press up toward the ceiling. Return. Repeat 10-20 x, 2-3 x per day.  Then,    Lie on back, holding cane above chest. Keeping arms as straight as possible, lower cane toward floor beyond head. Repeat _10-20___ times. Do _2-3___ sessions per day.  http://gt2.exer.us/91   Copyright  VHI. All rights reserved.   Madelyn Flavors, PT 07/05/16 10:25 AM [facility] [address] [place], [place], [address] Phone: [phone]   Fax:  [phone]

## 2016-07-05 NOTE — Therapy (Signed)
[facility] [place], [place], [address] Phone: [phone]   Fax:  [phone]  Physical Therapy Evaluation  Patient Details  Name: Brenda Duran MRN: [id number] Date of Birth: April 27, 1961 Referring Provider: Grier Mitts, PA-C/Justin Tamera Punt MD  Encounter Date: 07/05/2016      PT End of Session - 07/05/16 0949    Visit Number 1   Number of Visits 18   Date for PT Re-Evaluation 08/30/16   PT Start Time 0950   PT Stop Time 1034   PT Time Calculation (min) 44 min   Activity Tolerance Patient tolerated treatment well   Behavior During Therapy [facility] for tasks assessed/performed      Past Medical History:  Diagnosis Date  . Anxiety   . Arthritis    "head to toes"   . Back pain   . COPD (chronic obstructive pulmonary disease) ([place])   . Depression   . GERD (gastroesophageal reflux disease)   . Hypertension   . Neuromuscular disorder ([place])    shouler RCT's  . Osteopenia   . Pneumonia    [place]- 01/2015  . Seizures ([place])    6 seizures since 1995, last one 2012, seen in [place], stopeed medicine 3-4 yrs. ago, no seizure since   . Shortness of breath dyspnea   . Sleep apnea 2013   unsure of results  . Wrist fracture, left 01/2016    Past Surgical History:  Procedure Laterality Date  . ANKLE SURGERY    . BREAST SURGERY Left 2012   lumpectomy  . DILATION AND CURETTAGE OF UTERUS    . FOOT SURGERY    . KNEE SURGERY    . REVERSE SHOULDER ARTHROPLASTY Left 02/26/2016   Procedure: REVERSE SHOULDER ARTHROPLASTY;  Surgeon: Tania Ade, MD;  Location: [facility];  Service: Orthopedics;  Laterality: Left;  Reverse left shoulder arthroplasty  . REVERSE SHOULDER ARTHROPLASTY Right 05/13/2016   Procedure: REVERSE SHOULDER ARTHROPLASTY;  Surgeon: Tania Ade, MD;  Location: [facility];  Service: Orthopedics;  Laterality: Right;  Right reverse total shoulder arthroplasty  . SHOULDER ARTHROSCOPY Right 09/10/2015   Procedure: RIGHT SHOULDER ARTHROSCOPY ;  Surgeon: Dorna Leitz, MD;  Location: [facility];  Service: Orthopedics;  Laterality: Right;  . SHOULDER INJECTION Left 09/10/2015   Procedure: LEFT SHOULDER INJECTION;  Surgeon: Dorna Leitz, MD;  Location: [facility];  Service: Orthopedics;  Laterality: Left;  . SHOULDER SURGERY    . TONSILLECTOMY    . TOTAL SHOULDER REPLACEMENT Right     There were no vitals filed for this visit.       Subjective Assessment - 07/05/16 0949    Subjective Patient had a reverse TSA on the R 05/13/16.    Pertinent History L TSA 5/17, HTN, OA, asthma   Patient Stated Goals to improve movement   Currently in Pain? Yes   Pain Score 4    Pain Location Shoulder   Pain Orientation Right   Pain Descriptors / Indicators Pins and needles   Pain Type Surgical pain   Pain Onset More than a month ago   Pain Frequency Constant   Aggravating Factors  moving arm outward (ABD)   Pain Relieving Factors meds            [facility] PT Assessment - 07/05/16 0001      Assessment   Medical Diagnosis s/p R reverse TSA   Referring Provider Grier Mitts, PA-C/Justin Tamera Punt MD   Onset Date/Surgical Date 05/13/16   Next MD Visit 6 weeks  Precautions   Precautions Shoulder   Type of Shoulder Precautions PROM/AROM TO TOLERANCE, NO AGGRESSIVE STRETCHING, NO STRENGTHENING X 6 WKS (08/16/16)     Balance Screen   Has the patient fallen in the past 6 months No   Has the patient had a decrease in activity level because of a fear of falling?  Yes   Is the patient reluctant to leave their home because of a fear of falling?  No     Prior Function   Level of Independence Other (comment)  HHA 6 hours per day/ 5 days per week   Vocation Retired     ROM / Strength   AROM / PROM / Strength PROM;AROM     AROM   AROM Assessment Site Shoulder   Right/Left Shoulder Right   Right Shoulder Flexion 100 Degrees   Right Shoulder ABduction 80 Degrees   Right Shoulder Internal Rotation 54  Degrees   Right Shoulder External Rotation 15 Degrees     PROM   PROM Assessment Site Shoulder   Right/Left Shoulder Right   Right Shoulder Flexion 126 Degrees   Right Shoulder ABduction 90 Degrees   Right Shoulder Internal Rotation 65 Degrees   Right Shoulder External Rotation 19 Degrees     Palpation   Palpation comment tender in R deltoids and along incision                   [facility] Adult PT Treatment/Exercise - 07/05/16 0001      Modalities   Modalities Electrical Stimulation;Moist Heat     Moist Heat Therapy   Number Minutes Moist Heat 15 Minutes   Moist Heat Location Shoulder     Electrical Stimulation   Electrical Stimulation Location R shoulder premod 80-150 Hz x 15 min   Electrical Stimulation Goals Pain                PT Education - 07/05/16 1642    Education provided Yes   Education Details HEP   Person(s) Educated Patient   Methods Explanation;Demonstration;Handout   Comprehension Verbalized understanding;Returned demonstration             PT Long Term Goals - 07/05/16 1652      PT LONG TERM GOAL #1   Title I with HEP   Baseline no HEP   Time 8   Period Weeks   Status New     PT LONG TERM GOAL #2   Title Improved R shoulder AROM to [facility]   Baseline R shoulder flex 100 deg, ABD 80 deg, IR 54 deg, ER 15 deg.   Time 8   Period Weeks   Status New     PT LONG TERM GOAL #3   Title Decreased pain to 2/10 or less with ADLS   Baseline [id number]    Time 8   Period Weeks   Status New     PT LONG TERM GOAL #4   Title Demo R shoulder strength of 4+/5 to improve function   Baseline Not tested due to protocol restrictions   Time 8   Period Weeks   Status New               Plan - 07/05/16 1642    Clinical Impression Statement Patient presents s/p R reverse TSA on 05/13/16. She has pain, limited ROM affecting ADLs. She has a h/o multiple surgeries and is a fall risk.   Rehab Potential Good   PT Frequency 3x / week  PT  Duration 6 weeks   PT Treatment/Interventions ADLs/Self Care Home Management;Electrical Stimulation;Cryotherapy;Moist Heat;Patient/family education;Therapeutic exercise;Neuromuscular re-education;Manual techniques;Passive range of motion;Vasopneumatic Device   PT Next Visit Plan ROM (see precautions); no strengthening until 08/16/16; [place] per MD for pain.    PT Home Exercise Plan supine cane chest press, ER and OH flex.   Consulted and Agree with Plan of Care Patient      Patient will benefit from skilled therapeutic intervention in order to improve the following deficits and impairments:  Decreased range of motion, Pain, Impaired UE functional use, Decreased scar mobility, Decreased strength  Visit Diagnosis: Stiffness of right shoulder, not elsewhere classified - Plan: PT plan of care cert/re-cert  Pain in right shoulder - Plan: PT plan of care cert/re-cert     Problem List Patient Active Problem List   Diagnosis Date Noted  . Tobacco abuse 04/15/2016  . 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 Osteoarthritis of right shoulder 09/10/2015  . Rotator cuff tear arthropathy of right shoulder 09/10/2015  . Osteoarthritis of left shoulder 09/10/2015  . Chronic pain syndrome 09/02/2015  . Left shoulder pain 08/14/2015  . Right shoulder pain 08/14/2015  . HTN (hypertension) 08/14/2015  . Healthcare maintenance 08/14/2015  . COPD exacerbation ([place]) 07/17/2015  . Low grade squamous intraepithelial lesion (LGSIL) on cervical Pap smear 01/01/2014    Madelyn Flavors PT 07/05/2016, [facility] [address] [place], [place], [address] Phone: [phone]   Fax:  [phone]  Name: Brenda Duran MRN: [id number] Date of Birth: [birth year]

## 2016-07-09 ENCOUNTER — Telehealth: Payer: Self-pay | Admitting: Family Medicine

## 2016-07-09 ENCOUNTER — Ambulatory Visit: Payer: Medicaid Other | Admitting: Physical Therapy

## 2016-07-09 DIAGNOSIS — M25611 Stiffness of right shoulder, not elsewhere classified: Secondary | ICD-10-CM | POA: Diagnosis not present

## 2016-07-09 DIAGNOSIS — M25511 Pain in right shoulder: Secondary | ICD-10-CM

## 2016-07-09 NOTE — Therapy (Signed)
[facility] [place], [place], [address] Phone: [phone]   Fax:  [phone]  Physical Therapy Treatment  Patient Details  Name: Brenda Duran MRN: [id number] Date of Birth: Apr 09, 1961 Referring Provider: Grier Mitts, PA-C/Justin Tamera Punt MD  Encounter Date: 07/09/2016      PT End of Session - 07/09/16 1315    Visit Number 2   Number of Visits 18   Date for PT Re-Evaluation 08/30/16   PT Start Time 0945   PT Stop Time 1037   PT Time Calculation (min) 52 min   Activity Tolerance Patient tolerated treatment well   Behavior During Therapy [facility] for tasks assessed/performed      Past Medical History:  Diagnosis Date  . Anxiety   . Arthritis    "head to toes"   . Back pain   . COPD (chronic obstructive pulmonary disease) ([place])   . Depression   . GERD (gastroesophageal reflux disease)   . Hypertension   . Neuromuscular disorder ([place])    shouler RCT's  . Osteopenia   . Pneumonia    [place]- 01/2015  . Seizures ([place])    6 seizures since 1995, last one 2012, seen in [place], stopeed medicine 3-4 yrs. ago, no seizure since   . Shortness of breath dyspnea   . Sleep apnea 2013   unsure of results  . Wrist fracture, left 01/2016    Past Surgical History:  Procedure Laterality Date  . ANKLE SURGERY    . BREAST SURGERY Left 2012   lumpectomy  . DILATION AND CURETTAGE OF UTERUS    . FOOT SURGERY    . KNEE SURGERY    . REVERSE SHOULDER ARTHROPLASTY Left 02/26/2016   Procedure: REVERSE SHOULDER ARTHROPLASTY;  Surgeon: Tania Ade, MD;  Location: [place];  Service: Orthopedics;  Laterality: Left;  Reverse left shoulder arthroplasty  . REVERSE SHOULDER ARTHROPLASTY Right 05/13/2016   Procedure: REVERSE SHOULDER ARTHROPLASTY;  Surgeon: Tania Ade, MD;  Location: [place];  Service: Orthopedics;  Laterality: Right;  Right reverse total shoulder arthroplasty  . SHOULDER ARTHROSCOPY Right 09/10/2015   Procedure: RIGHT SHOULDER ARTHROSCOPY ;  Surgeon: Dorna Leitz, MD;  Location: [place];  Service: Orthopedics;  Laterality: Right;  . SHOULDER INJECTION Left 09/10/2015   Procedure: LEFT SHOULDER INJECTION;  Surgeon: Dorna Leitz, MD;  Location: [place];  Service: Orthopedics;  Laterality: Left;  . SHOULDER SURGERY    . TONSILLECTOMY    . TOTAL SHOULDER REPLACEMENT Right     There were no vitals filed for this visit.      Subjective Assessment - 07/09/16 1318    Subjective No new complaints.   Patient Stated Goals to improve movement   Pain Score 4    Pain Location Shoulder   Pain Orientation Right   Pain Descriptors / Indicators Pins and needles   Pain Onset More than a month ago    Treatment:  In supine PAROM into flexion and ER and rhy stabs at 90 degrees x 23 minutes f/b HMP and e' stim x 20 minutes.                                  PT Long Term Goals - 07/05/16 1652      PT LONG TERM GOAL #1   Title I with HEP   Baseline no HEP   Time 8   Period Weeks   Status New  PT LONG TERM GOAL #2   Title Improved R shoulder AROM to [facility]   Baseline R shoulder flex 100 deg, ABD 80 deg, IR 54 deg, ER 15 deg.   Time 8   Period Weeks   Status New     PT LONG TERM GOAL #3   Title Decreased pain to 2/10 or less with ADLS   Baseline [id number]    Time 8   Period Weeks   Status New     PT LONG TERM GOAL #4   Title Demo R shoulder strength of 4+/5 to improve function   Baseline Not tested due to protocol restrictions   Time 8   Period Weeks   Status New             Patient will benefit from skilled therapeutic intervention in order to improve the following deficits and impairments:  Decreased range of motion, Pain, Impaired UE functional use, Decreased scar mobility, Decreased strength  Visit Diagnosis: Stiffness of right shoulder, not elsewhere classified  Pain in right shoulder     Problem List Patient Active Problem List   Diagnosis  Date Noted  . Tobacco abuse 04/15/2016  . 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 Osteoarthritis of right shoulder 09/10/2015  . Rotator cuff tear arthropathy of right shoulder 09/10/2015  . Osteoarthritis of left shoulder 09/10/2015  . Chronic pain syndrome 09/02/2015  . Left shoulder pain 08/14/2015  . Right shoulder pain 08/14/2015  . HTN (hypertension) 08/14/2015  . Healthcare maintenance 08/14/2015  . COPD exacerbation ([place]) 07/17/2015  . Low grade squamous intraepithelial lesion (LGSIL) on cervical Pap smear 01/01/2014    Lorelee Mclaurin, [place] 07/09/2016, 1:26 PM  [facility] [place], [place], [address] Phone: [phone]   Fax:  [phone]  Name: Brenda Duran MRN: [id number] Date of Birth: November 06, 1960

## 2016-07-09 NOTE — Telephone Encounter (Signed)
rx sent back to Dr Wendi Snipes to sign

## 2016-07-14 ENCOUNTER — Ambulatory Visit: Payer: Medicaid Other | Admitting: Physical Therapy

## 2016-07-14 DIAGNOSIS — M25611 Stiffness of right shoulder, not elsewhere classified: Secondary | ICD-10-CM | POA: Diagnosis not present

## 2016-07-14 DIAGNOSIS — M25511 Pain in right shoulder: Secondary | ICD-10-CM

## 2016-07-14 NOTE — Therapy (Signed)
[facility] [place], [place], [address] Phone: [phone]   Fax:  [phone]  Physical Therapy Treatment  Patient Details  Name: Brenda Duran MRN: [id number] Date of Birth: 02-27-1961 Referring Provider: Grier Mitts, PA-C/Justin Tamera Punt MD  Encounter Date: 07/14/2016      PT End of Session - 07/14/16 1035    Visit Number 3   Number of Visits 18   Date for PT Re-Evaluation 08/30/16   PT Start Time 1034   PT Stop Time 1130   PT Time Calculation (min) 56 min   Activity Tolerance Patient tolerated treatment well;Patient limited by pain   Behavior During Therapy [facility] for tasks assessed/performed      Past Medical History:  Diagnosis Date  . Anxiety   . Arthritis    "head to toes"   . Back pain   . COPD (chronic obstructive pulmonary disease) ([place])   . Depression   . GERD (gastroesophageal reflux disease)   . Hypertension   . Neuromuscular disorder ([place])    shouler RCT's  . Osteopenia   . Pneumonia    [place]- 01/2015  . Seizures ([place])    6 seizures since 1995, last one 2012, seen in [place], stopeed medicine 3-4 yrs. ago, no seizure since   . Shortness of breath dyspnea   . Sleep apnea 2013   unsure of results  . Wrist fracture, left 01/2016    Past Surgical History:  Procedure Laterality Date  . ANKLE SURGERY    . BREAST SURGERY Left 2012   lumpectomy  . DILATION AND CURETTAGE OF UTERUS    . FOOT SURGERY    . KNEE SURGERY    . REVERSE SHOULDER ARTHROPLASTY Left 02/26/2016   Procedure: REVERSE SHOULDER ARTHROPLASTY;  Surgeon: Tania Ade, MD;  Location: [place];  Service: Orthopedics;  Laterality: Left;  Reverse left shoulder arthroplasty  . REVERSE SHOULDER ARTHROPLASTY Right 05/13/2016   Procedure: REVERSE SHOULDER ARTHROPLASTY;  Surgeon: Tania Ade, MD;  Location: [place];  Service: Orthopedics;  Laterality: Right;  Right reverse total shoulder arthroplasty  . SHOULDER ARTHROSCOPY  Right 09/10/2015   Procedure: RIGHT SHOULDER ARTHROSCOPY ;  Surgeon: Dorna Leitz, MD;  Location: [place];  Service: Orthopedics;  Laterality: Right;  . SHOULDER INJECTION Left 09/10/2015   Procedure: LEFT SHOULDER INJECTION;  Surgeon: Dorna Leitz, MD;  Location: [place];  Service: Orthopedics;  Laterality: Left;  . SHOULDER SURGERY    . TONSILLECTOMY    . TOTAL SHOULDER REPLACEMENT Right     There were no vitals filed for this visit.      Subjective Assessment - 07/14/16 1035    Subjective Patient feels her arm is doing better and that she can raise it higher than she could last week.   Pertinent History L TSA 5/17, HTN, OA, asthma   Patient Stated Goals to improve movement   Currently in Pain? Yes   Pain Score 2    Pain Location Shoulder   Pain Orientation Right   Pain Descriptors / Indicators Throbbing   Pain Type Surgical pain   Pain Onset More than a month ago                         [facility] Adult PT Treatment/Exercise - 07/14/16 0001      Exercises   Exercises Shoulder     Shoulder Exercises: Pulleys   Flexion 3 minutes     Modalities   Modalities Electrical Stimulation;Moist  Heat     Moist Heat Therapy   Number Minutes Moist Heat 15 Minutes   Moist Heat Location Shoulder     Electrical Stimulation   Electrical Stimulation Location R shoulder premod 80-150 Hz x 15 min   Electrical Stimulation Goals Pain     Manual Therapy   Manual Therapy Passive ROM;Soft tissue mobilization   Manual therapy comments scar massage   Soft tissue mobilization to R upper arm and posterior RC   Passive ROM to R shoulder within restrictions                     PT Long Term Goals - 07/05/16 1652      PT LONG TERM GOAL #1   Title I with HEP   Baseline no HEP   Time 8   Period Weeks   Status New     PT LONG TERM GOAL #2   Title Improved R shoulder AROM to [facility]   Baseline R shoulder flex 100 deg, ABD 80 deg, IR 54 deg, ER 15 deg.   Time 8   Period  Weeks   Status New     PT LONG TERM GOAL #3   Title Decreased pain to 2/10 or less with ADLS   Baseline [id number]    Time 8   Period Weeks   Status New     PT LONG TERM GOAL #4   Title Demo R shoulder strength of 4+/5 to improve function   Baseline Not tested due to protocol restrictions   Time 8   Period Weeks   Status New               Plan - 07/14/16 1232    Clinical Impression Statement Patient tolerates ROM fairly well. She has some crepitis with ER. Mild adhesions noted along incision. Patient was educated in scar massage for HEP.    PT Treatment/Interventions ADLs/Self Care Home Management;Electrical Stimulation;Cryotherapy;Moist Heat;Patient/family education;Therapeutic exercise;Neuromuscular re-education;Manual techniques;Passive range of motion;Vasopneumatic Device   PT Next Visit Plan Try [place] to upper arm; ROM (see precautions); no strengthening until 08/16/16; [place] per MD for pain.    PT Home Exercise Plan supine cane chest press, ER and OH flex.   Consulted and Agree with Plan of Care Patient      Patient will benefit from skilled therapeutic intervention in order to improve the following deficits and impairments:  Decreased range of motion, Pain, Impaired UE functional use, Decreased scar mobility, Decreased strength  Visit Diagnosis: Stiffness of right shoulder, not elsewhere classified  Pain in right shoulder     Problem List Patient Active Problem List   Diagnosis Date Noted  . Tobacco abuse 04/15/2016  . 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 Osteoarthritis of right shoulder 09/10/2015  . Rotator cuff tear arthropathy of right shoulder 09/10/2015  . Osteoarthritis of left shoulder 09/10/2015  . Chronic pain syndrome 09/02/2015  . Left shoulder pain 08/14/2015  . Right shoulder pain 08/14/2015  . HTN (hypertension) 08/14/2015  . Healthcare maintenance 08/14/2015  . COPD exacerbation ([place]) 07/17/2015  . Low grade squamous  intraepithelial lesion (LGSIL) on cervical Pap smear 01/01/2014    Madelyn Flavors PT 07/14/2016, 12:36 PM  [facility] [address] [place], [place], [address] Phone: [phone]   Fax:  [phone]  Name: Brenda Duran MRN: [id number] Date of Birth: 1961/06/25

## 2016-07-16 ENCOUNTER — Ambulatory Visit: Payer: Medicaid Other | Admitting: Physical Therapy

## 2016-07-16 DIAGNOSIS — M25611 Stiffness of right shoulder, not elsewhere classified: Secondary | ICD-10-CM

## 2016-07-16 DIAGNOSIS — M25511 Pain in right shoulder: Secondary | ICD-10-CM

## 2016-07-16 NOTE — Therapy (Signed)
[facility] [place], [place], [address] Phone: [phone]   Fax:  [phone]  Physical Therapy Treatment  Patient Details  Name: Brenda Duran MRN: [id number] Date of Birth: 06/25/1961 Referring Provider: Grier Mitts, PA-C/Justin Tamera Punt MD  Encounter Date: 07/16/2016      PT End of Session - 07/16/16 0906    Visit Number 4   Number of Visits 18   Date for PT Re-Evaluation 08/30/16   PT Start Time 0906   PT Stop Time 1002   PT Time Calculation (min) 56 min   Activity Tolerance Patient limited by pain   Behavior During Therapy [facility] for tasks assessed/performed      Past Medical History:  Diagnosis Date  . Anxiety   . Arthritis    "head to toes"   . Back pain   . COPD (chronic obstructive pulmonary disease) ([place])   . Depression   . GERD (gastroesophageal reflux disease)   . Hypertension   . Neuromuscular disorder ([place])    shouler RCT's  . Osteopenia   . Pneumonia    [place]- 01/2015  . Seizures ([place])    6 seizures since 1995, last one 2012, seen in [place], stopeed medicine 3-4 yrs. ago, no seizure since   . Shortness of breath dyspnea   . Sleep apnea 2013   unsure of results  . Wrist fracture, left 01/2016    Past Surgical History:  Procedure Laterality Date  . ANKLE SURGERY    . BREAST SURGERY Left 2012   lumpectomy  . DILATION AND CURETTAGE OF UTERUS    . FOOT SURGERY    . KNEE SURGERY    . REVERSE SHOULDER ARTHROPLASTY Left 02/26/2016   Procedure: REVERSE SHOULDER ARTHROPLASTY;  Surgeon: Tania Ade, MD;  Location: [place];  Service: Orthopedics;  Laterality: Left;  Reverse left shoulder arthroplasty  . REVERSE SHOULDER ARTHROPLASTY Right 05/13/2016   Procedure: REVERSE SHOULDER ARTHROPLASTY;  Surgeon: Tania Ade, MD;  Location: [place];  Service: Orthopedics;  Laterality: Right;  Right reverse total shoulder arthroplasty  . SHOULDER ARTHROSCOPY Right 09/10/2015   Procedure: RIGHT  SHOULDER ARTHROSCOPY ;  Surgeon: Dorna Leitz, MD;  Location: [place];  Service: Orthopedics;  Laterality: Right;  . SHOULDER INJECTION Left 09/10/2015   Procedure: LEFT SHOULDER INJECTION;  Surgeon: Dorna Leitz, MD;  Location: [place];  Service: Orthopedics;  Laterality: Left;  . SHOULDER SURGERY    . TONSILLECTOMY    . TOTAL SHOULDER REPLACEMENT Right     There were no vitals filed for this visit.      Subjective Assessment - 07/16/16 0906    Subjective Patient reports increased pain since she slept in her bed last night. It causes her pain turning over.   Pertinent History L TSA 5/17, HTN, OA, asthma   Patient Stated Goals to improve movement   Currently in Pain? Yes   Pain Score 7    Pain Location Shoulder   Pain Orientation Right;Left   Pain Descriptors / Indicators Throbbing                         [facility] Adult PT Treatment/Exercise - 07/16/16 0001      Modalities   Modalities Electrical Stimulation;Moist Heat;Ultrasound     Moist Heat Therapy   Number Minutes Moist Heat 15 Minutes   Moist Heat Location Shoulder     Electrical Stimulation   Electrical Stimulation Location R shoulder IFC 80-150 Hz to tolerance  x 15 min   Electrical Stimulation Goals Pain     Ultrasound   Ultrasound Location R humerus   Ultrasound Parameters 1.5 W/cm2 cont 1.0 - 3.3 MHz x 10 min   Ultrasound Goals Pain     Manual Therapy   Manual Therapy Passive ROM;Soft tissue mobilization   Soft tissue mobilization R lateral humerus   Passive ROM to R shoulder within restrictions with ocillations for pain control                PT Education - 07/16/16 0956    Education provided Yes   Education Details reviewed HEP for ER/IR using towel and flexion with cane   Person(s) Educated Patient   Methods Explanation;Demonstration;Handout   Comprehension Verbalized understanding             PT Long Term Goals - 07/05/16 1652      PT LONG TERM GOAL #1   Title I with HEP    Baseline no HEP   Time 8   Period Weeks   Status New     PT LONG TERM GOAL #2   Title Improved R shoulder AROM to [facility]   Baseline R shoulder flex 100 deg, ABD 80 deg, IR 54 deg, ER 15 deg.   Time 8   Period Weeks   Status New     PT LONG TERM GOAL #3   Title Decreased pain to 2/10 or less with ADLS   Baseline [id number]    Time 8   Period Weeks   Status New     PT LONG TERM GOAL #4   Title Demo R shoulder strength of 4+/5 to improve function   Baseline Not tested due to protocol restrictions   Time 8   Period Weeks   Status New               Plan - 07/16/16 0959    Clinical Impression Statement Patient was in more pain today than she has been which she attributes to sleeping on her stomach in her bed. She was tearful during ROM, due to her frustration with her constant pain. She reported that she has not been compliant with her ROM exercises and PT stressed importance doing her HEP. Goals are ongoing.   Rehab Potential Good   PT Frequency 3x / week   PT Duration 6 weeks   PT Treatment/Interventions ADLs/Self Care Home Management;Electrical Stimulation;Cryotherapy;Moist Heat;Patient/family education;Therapeutic exercise;Neuromuscular re-education;Manual techniques;Passive range of motion;Vasopneumatic Device   PT Next Visit Plan Assess response to [place] to upper arm; ROM (see precautions); no strengthening until 08/16/16; [place] per MD for pain.    PT Home Exercise Plan supine cane chest press, ER and OH flex.   Consulted and Agree with Plan of Care Patient      Patient will benefit from skilled therapeutic intervention in order to improve the following deficits and impairments:  Decreased range of motion, Pain, Impaired UE functional use, Decreased scar mobility, Decreased strength  Visit Diagnosis: Pain in right shoulder  Stiffness of right shoulder, not elsewhere classified     Problem List Patient Active Problem List   Diagnosis Date Noted  . Tobacco abuse  04/15/2016  . 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 Osteoarthritis of right shoulder 09/10/2015  . Rotator cuff tear arthropathy of right shoulder 09/10/2015  . Osteoarthritis of left shoulder 09/10/2015  . Chronic pain syndrome 09/02/2015  . Left shoulder pain 08/14/2015  . Right shoulder pain 08/14/2015  . HTN (hypertension)  08/14/2015  . Healthcare maintenance 08/14/2015  . COPD exacerbation ([place]) 07/17/2015  . Low grade squamous intraepithelial lesion (LGSIL) on cervical Pap smear 01/01/2014    Madelyn Flavors PT 07/16/2016, 10:09 AM  [facility] [place], [place], [address] Phone: [phone]   Fax:  [phone]  Name: Brenda Duran MRN: [id number] Date of Birth: June 10, 1961

## 2016-07-19 ENCOUNTER — Ambulatory Visit: Payer: Medicaid Other | Attending: Orthopedic Surgery | Admitting: Physical Therapy

## 2016-07-19 ENCOUNTER — Encounter: Payer: Self-pay | Admitting: Physical Therapy

## 2016-07-19 DIAGNOSIS — M25611 Stiffness of right shoulder, not elsewhere classified: Secondary | ICD-10-CM | POA: Insufficient documentation

## 2016-07-19 DIAGNOSIS — M25511 Pain in right shoulder: Secondary | ICD-10-CM | POA: Diagnosis present

## 2016-07-19 NOTE — Therapy (Signed)
[facility] [place], [place], [address] Phone: [phone]   Fax:  [phone]  Physical Therapy Treatment  Patient Details  Name: Brenda Duran MRN: [id number] Date of Birth: [birth year] Referring Provider: Grier Mitts, PA-C/Justin Tamera Punt MD  Encounter Date: 07/19/2016      PT End of Session - 07/19/16 1255    Visit Number 5   Number of Visits 18   Date for PT Re-Evaluation 08/30/16   PT Start Time 1236   PT Stop Time 1316   PT Time Calculation (min) 40 min   Activity Tolerance Patient tolerated treatment well   Behavior During Therapy [facility] for tasks assessed/performed      Past Medical History:  Diagnosis Date  . Anxiety   . Arthritis    "head to toes"   . Back pain   . COPD (chronic obstructive pulmonary disease) ([place])   . Depression   . GERD (gastroesophageal reflux disease)   . Hypertension   . Neuromuscular disorder ([place])    shouler RCT's  . Osteopenia   . Pneumonia    [place]- 01/2015  . Seizures ([place])    6 seizures since 1995, last one 2012, seen in [place], stopeed medicine 3-4 yrs. ago, no seizure since   . Shortness of breath dyspnea   . Sleep apnea 2013   unsure of results  . Wrist fracture, left 01/2016    Past Surgical History:  Procedure Laterality Date  . ANKLE SURGERY    . BREAST SURGERY Left 2012   lumpectomy  . DILATION AND CURETTAGE OF UTERUS    . FOOT SURGERY    . KNEE SURGERY    . REVERSE SHOULDER ARTHROPLASTY Left 02/26/2016   Procedure: REVERSE SHOULDER ARTHROPLASTY;  Surgeon: Tania Ade, MD;  Location: [place];  Service: Orthopedics;  Laterality: Left;  Reverse left shoulder arthroplasty  . REVERSE SHOULDER ARTHROPLASTY Right 05/13/2016   Procedure: REVERSE SHOULDER ARTHROPLASTY;  Surgeon: Tania Ade, MD;  Location: [place];  Service: Orthopedics;  Laterality: Right;  Right reverse total shoulder arthroplasty  . SHOULDER ARTHROSCOPY Right 09/10/2015   Procedure: RIGHT SHOULDER ARTHROSCOPY ;  Surgeon: Dorna Leitz, MD;  Location: [place];  Service: Orthopedics;  Laterality: Right;  . SHOULDER INJECTION Left 09/10/2015   Procedure: LEFT SHOULDER INJECTION;  Surgeon: Dorna Leitz, MD;  Location: [place];  Service: Orthopedics;  Laterality: Left;  . SHOULDER SURGERY    . TONSILLECTOMY    . TOTAL SHOULDER REPLACEMENT Right     There were no vitals filed for this visit.      Subjective Assessment - 07/19/16 1241    Subjective Patient arrived with some pain and soreness yet not as much due to meds   Pertinent History L TSA 5/17, HTN, OA, asthma   Patient Stated Goals to improve movement   Currently in Pain? Yes   Pain Score 3    Pain Location Shoulder   Pain Orientation Right   Pain Descriptors / Indicators Sore   Pain Type Surgical pain   Pain Onset More than a month ago   Pain Frequency Constant   Aggravating Factors  certain movements   Pain Relieving Factors rest and meds            [facility] PT Assessment - 07/19/16 0001      PROM   PROM Assessment Site Shoulder   Right/Left Shoulder Right   Right Shoulder Flexion 130 Degrees   Right Shoulder External Rotation 40 Degrees  [place] Adult PT Treatment/Exercise - 07/19/16 0001      Shoulder Exercises: Supine   Other Supine Exercises cane exercises for flexion, chest press and ER 2x10 each     Moist Heat Therapy   Number Minutes Moist Heat 15 Minutes   Moist Heat Location Shoulder     Electrical Stimulation   Electrical Stimulation Location right shoulder   Electrical Stimulation Action IFC   Electrical Stimulation Parameters 80-_0  x40mn   Electrical Stimulation Goals Pain     Manual Therapy   Manual Therapy Passive ROM   Passive ROM to R shoulder within restrictions with ocillations for pain control                     PT Long Term Goals - 07/19/16 1312      PT LONG TERM GOAL #1   Title I with HEP   Baseline no HEP    Time 8   Period Weeks   Status Achieved     PT LONG TERM GOAL #2   Title [facility]  Baseline R shoulder flex 100 deg, ABD 80 deg, IR 54 deg, ER 15 deg.   Time 8   Period Weeks   Status On-going     PT LONG TERM GOAL #3   Title Decreased pain to 2/10 or less with ADLS   Baseline 41/61   Time 8   Period Weeks   Status On-going     PT LONG TERM GOAL #4   Title Demo R shoulder strength of 4+/5 to improve function   Baseline Not tested due to protocol restrictions   Time 8   Period Weeks   Status On-going               Plan - 07/19/16 1257    Clinical Impression Statement Patient tolerated treatment well today. Patient reorted less pain today and has improved PROM today with gentle ROM. Patient was educated on correct technique with cane execises. Patient met LTG #1  and other goals ongoing due to protocol limitations.   Rehab Potential Good   PT Frequency 3x / week   PT Duration 6 weeks   PT Treatment/Interventions ADLs/Self Care Home Management;Electrical Stimulation;Cryotherapy;Moist Heat;Patient/family education;Therapeutic exercise;Neuromuscular re-education;Manual techniques;Passive range of motion;Vasopneumatic Device   PT Next Visit Plan cont with POC per  ROM (see precautions); no strengthening until 08/16/16; UK[place]aper MD for pain.    Consulted and Agree with Plan of Care Patient      Patient will benefit from skilled therapeutic intervention in order to improve the following deficits and impairments:  Decreased range of motion, Pain, Impaired UE functional use, Decreased scar mobility, Decreased strength  Visit Diagnosis: Right shoulder pain, unspecified chronicity  Stiffness of right shoulder, not elsewhere classified     Problem List Patient Active Problem List   Diagnosis Date Noted  . Tobacco abuse 04/15/2016  . S/[place] reverse total shoulder arthroplasty 02/26/2016  . Osteoarthritis of right shoulder 09/10/2015  . Rotator cuff  tear arthropathy of right shoulder 09/10/2015  . Osteoarthritis of left shoulder 09/10/2015  . Chronic pain syndrome 09/02/2015  . Left shoulder pain 08/14/2015  . Right shoulder pain 08/14/2015  . HTN (hypertension) 08/14/2015  . Healthcare maintenance 08/14/2015  . [place] 07/17[place]  . Low grade squamous intraepithelial lesion (LGSIL) on cervical Pap smear 01/01/2014    Brenda Duran, Brenda Duran 07/19/2016, 1:17 PM  [facility] [address]  [place], [place], [address] Phone: [phone]   Fax:  [phone]  Name: Brenda Duran MRN: [id number] Date of Birth: 04-20-1961

## 2016-07-22 ENCOUNTER — Ambulatory Visit: Payer: Medicaid Other | Admitting: Family Medicine

## 2016-07-22 ENCOUNTER — Other Ambulatory Visit: Payer: Self-pay | Admitting: Family Medicine

## 2016-07-22 DIAGNOSIS — F329 Major depressive disorder, single episode, unspecified: Secondary | ICD-10-CM

## 2016-07-22 DIAGNOSIS — F32A Depression, unspecified: Secondary | ICD-10-CM

## 2016-07-23 ENCOUNTER — Ambulatory Visit: Payer: Medicaid Other | Admitting: Physical Therapy

## 2016-07-23 ENCOUNTER — Encounter: Payer: Self-pay | Admitting: Family Medicine

## 2016-07-23 ENCOUNTER — Ambulatory Visit (INDEPENDENT_AMBULATORY_CARE_PROVIDER_SITE_OTHER): Payer: Medicaid Other | Admitting: Family Medicine

## 2016-07-23 VITALS — BP 121/75 | HR 96 | Temp 98.0°F | Ht 59.0 in | Wt 141.2 lb

## 2016-07-23 DIAGNOSIS — L600 Ingrowing nail: Secondary | ICD-10-CM

## 2016-07-23 DIAGNOSIS — Z23 Encounter for immunization: Secondary | ICD-10-CM | POA: Diagnosis not present

## 2016-07-23 DIAGNOSIS — Z720 Tobacco use: Secondary | ICD-10-CM

## 2016-07-23 DIAGNOSIS — M25511 Pain in right shoulder: Secondary | ICD-10-CM | POA: Diagnosis not present

## 2016-07-23 DIAGNOSIS — M25611 Stiffness of right shoulder, not elsewhere classified: Secondary | ICD-10-CM

## 2016-07-23 DIAGNOSIS — G894 Chronic pain syndrome: Secondary | ICD-10-CM | POA: Diagnosis not present

## 2016-07-23 DIAGNOSIS — J449 Chronic obstructive pulmonary disease, unspecified: Secondary | ICD-10-CM

## 2016-07-23 NOTE — Patient Instructions (Addendum)
Great to see you!  Come back in 3 months unless you need [place] sooner, you can call [facility] for an appointment or wait for [place] to work on it.    When you need your nebulizer use plain albuterol, I have canceled ipratropium bromide

## 2016-07-23 NOTE — Progress Notes (Signed)
   HPI  Patient presents today for follow-up of COPD and with complaint of ingrown toenail.  Ingrown toenail Previously managed by [facility], she had partial removal and states that she has persisting granulation tissue forming and coming out of the nail edge. Is not visible today She states that she usually clips it out She has intermittent left great toe pain due to this.  COPD Breathing better with the change of seasons, states that she is doing very good with her medications but needs a refill of Atrovent. She is taking Spiriva. Still smoking, Chantix didn't really help  PMH: Smoking status noted ROS: Per HPI  Objective: BP 121/75   Pulse 96   Temp 98 F (36.7 C) (Oral)   Ht 4\' 11"  (1.499 m)   Wt 141 lb 3.2 oz (64 kg)   BMI 28.52 kg/m  Gen: NAD, alert, cooperative with exam HEENT: NCAT CV: RRR, good S1/S2, no murmur Resp: CTABL, no wheezes, non-labored Ext: No edema, warm Neuro: Alert and oriented  Skin:  R shoulder - healing scar Still wearing fall risk bracelet from hospital admission    Assessment and plan:  # Ingrown toenail Referring back to podiatry, patient requesting referral  # Chronic pain syndrome Stable Still taking Cymbalta, continue  # Tobacco abuse Still has not quit, has failed Chantix  # COPD Improving, continue Dulera and Spiriva, continue albuterol nebulizer, however discontinued ipratropium bromide due to already taking Spiriva  Influenza vaccine given today, counseling provided   Orders Placed This Encounter  Procedures  . Ambulatory referral to Podiatry    Referral Priority:   Routine    Referral Type:   Consultation    Referral Reason:   Specialty Services Required    Requested Specialty:   Podiatry    Number of Visits Requested:   [place], MD [facility] 07/23/2016, 1:33 PM

## 2016-07-23 NOTE — Therapy (Signed)
[facility] [place], [place], [address] Phone: [phone]   Fax:  [phone]  Physical Therapy Treatment  Patient Details  Name: Brenda Duran MRN: [id number] Date of Birth: 1961/05/19 Referring Provider: Grier Mitts, PA-C/Justin Tamera Punt MD  Encounter Date: 07/23/2016      PT End of Session - 07/23/16 0907    Visit Number 6   Number of Visits 18   Date for PT Re-Evaluation 08/30/16   PT Start Time 0904   PT Stop Time 0957   PT Time Calculation (min) 53 min   Activity Tolerance Patient tolerated treatment well   Behavior During Therapy [facility] for tasks assessed/performed      Past Medical History:  Diagnosis Date  . Anxiety   . Arthritis    "head to toes"   . Back pain   . COPD (chronic obstructive pulmonary disease) ([place])   . Depression   . GERD (gastroesophageal reflux disease)   . Hypertension   . Neuromuscular disorder ([place])    shouler RCT's  . Osteopenia   . Pneumonia    [place]- 01/2015  . Seizures ([place])    6 seizures since 1995, last one 2012, seen in [place], stopeed medicine 3-4 yrs. ago, no seizure since   . Shortness of breath dyspnea   . Sleep apnea 2013   unsure of results  . Wrist fracture, left 01/2016    Past Surgical History:  Procedure Laterality Date  . ANKLE SURGERY    . BREAST SURGERY Left 2012   lumpectomy  . DILATION AND CURETTAGE OF UTERUS    . FOOT SURGERY    . KNEE SURGERY    . REVERSE SHOULDER ARTHROPLASTY Left 02/26/2016   Procedure: REVERSE SHOULDER ARTHROPLASTY;  Surgeon: Tania Ade, MD;  Location: [place];  Service: Orthopedics;  Laterality: Left;  Reverse left shoulder arthroplasty  . REVERSE SHOULDER ARTHROPLASTY Right 05/13/2016   Procedure: REVERSE SHOULDER ARTHROPLASTY;  Surgeon: Tania Ade, MD;  Location: [place];  Service: Orthopedics;  Laterality: Right;  Right reverse total shoulder arthroplasty  . SHOULDER ARTHROSCOPY Right 09/10/2015   Procedure: RIGHT SHOULDER ARTHROSCOPY ;  Surgeon: Dorna Leitz, MD;  Location: [place];  Service: Orthopedics;  Laterality: Right;  . SHOULDER INJECTION Left 09/10/2015   Procedure: LEFT SHOULDER INJECTION;  Surgeon: Dorna Leitz, MD;  Location: [place];  Service: Orthopedics;  Laterality: Left;  . SHOULDER SURGERY    . TONSILLECTOMY    . TOTAL SHOULDER REPLACEMENT Right     There were no vitals filed for this visit.      Subjective Assessment - 07/23/16 0907    Subjective Patient states she is doing fair today.   Pertinent History L TSA 5/17, HTN, OA, asthma   Patient Stated Goals to improve movement   Currently in Pain? Yes   Pain Score 3    Pain Location Shoulder   Pain Orientation Right   Pain Descriptors / Indicators Sore                         [facility] Adult PT Treatment/Exercise - 07/23/16 0001      Shoulder Exercises: Supine   Flexion Right;AROM;15 reps  in scapular plane   Other Supine Exercises cane exercises for flexion and ER 3x10 each   Other Supine Exercises gentle isometric deltoid in prayer position; isometrics in scapular plane 10 sec hold x 5 each     Shoulder Exercises: Standing   Other Standing Exercises  wall slides x 10     Modalities   Modalities Electrical Stimulation;Moist Heat     Moist Heat Therapy   Number Minutes Moist Heat 15 Minutes   Moist Heat Location Shoulder     Electrical Stimulation   Electrical Stimulation Location R shoulder    Electrical Stimulation Action IFC   Electrical Stimulation Parameters 80+150 Hz x 15 min   Electrical Stimulation Goals Pain     Manual Therapy   Manual Therapy Scapular mobilization;Soft tissue mobilization;Passive ROM   Soft tissue mobilization to R infraspinatus   Scapular Mobilization protraction/retraction   Passive ROM to R shoulder in scapular plane: flex, ER, ABD                     PT Long Term Goals - 07/19/16 1312      PT LONG TERM GOAL #1   Title I with HEP    Baseline no HEP   Time 8   Period Weeks   Status Achieved     PT LONG TERM GOAL #2   Title Improved R shoulder AROM to [facility]   Baseline R shoulder flex 100 deg, ABD 80 deg, IR 54 deg, ER 15 deg.   Time 8   Period Weeks   Status On-going     PT LONG TERM GOAL #3   Title Decreased pain to 2/10 or less with ADLS   Baseline [id number]    Time 8   Period Weeks   Status On-going     PT LONG TERM GOAL #4   Title Demo R shoulder strength of 4+/5 to improve function   Baseline Not tested due to protocol restrictions   Time 8   Period Weeks   Status On-going             Patient will benefit from skilled therapeutic intervention in order to improve the following deficits and impairments:     Visit Diagnosis: Right shoulder pain, unspecified chronicity  Stiffness of right shoulder, not elsewhere classified     Problem List Patient Active Problem List   Diagnosis Date Noted  . Tobacco abuse 04/15/2016  . 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 Osteoarthritis of right shoulder 09/10/2015  . Rotator cuff tear arthropathy of right shoulder 09/10/2015  . Osteoarthritis of left shoulder 09/10/2015  . Chronic pain syndrome 09/02/2015  . Left shoulder pain 08/14/2015  . Right shoulder pain 08/14/2015  . HTN (hypertension) 08/14/2015  . Healthcare maintenance 08/14/2015  . COPD exacerbation ([place]) 07/17/2015  . Low grade squamous intraepithelial lesion (LGSIL) on cervical Pap smear 01/01/2014    Madelyn Flavors PT 07/23/2016, 12:48 PM  [facility] [address] [place], [place], [address] Phone: [phone]   Fax:  [phone]  Name: Brenda Duran MRN: [id number] Date of Birth: [birth year]

## 2016-07-26 ENCOUNTER — Encounter: Payer: Self-pay | Admitting: Family Medicine

## 2016-07-26 ENCOUNTER — Encounter: Payer: Medicaid Other | Admitting: Physical Therapy

## 2016-07-27 ENCOUNTER — Encounter: Payer: Medicaid Other | Admitting: Physical Therapy

## 2016-07-28 ENCOUNTER — Encounter: Payer: Self-pay | Admitting: Physical Therapy

## 2016-07-28 ENCOUNTER — Ambulatory Visit: Payer: Medicaid Other | Admitting: Physical Therapy

## 2016-07-28 DIAGNOSIS — M25611 Stiffness of right shoulder, not elsewhere classified: Secondary | ICD-10-CM

## 2016-07-28 DIAGNOSIS — M25511 Pain in right shoulder: Secondary | ICD-10-CM | POA: Diagnosis not present

## 2016-07-28 NOTE — Therapy (Signed)
[facility] [place], [place], [address] Phone: [phone]   Fax:  [phone]  Physical Therapy Treatment  Patient Details  Name: Brenda Duran MRN: [id number] Date of Birth: January 01, 1961 Referring Provider: Grier Mitts, PA-C/Justin Tamera Punt MD  Encounter Date: 07/28/2016      PT End of Session - 07/28/16 1102    Visit Number 7   Number of Visits 18   Date for PT Re-Evaluation 08/30/16   PT Start Time 1031   PT Stop Time 1126   PT Time Calculation (min) 55 min   Activity Tolerance Patient tolerated treatment well   Behavior During Therapy [facility] for tasks assessed/performed      Past Medical History:  Diagnosis Date  . Anxiety   . Arthritis    "head to toes"   . Back pain   . COPD (chronic obstructive pulmonary disease) ([place])   . Depression   . GERD (gastroesophageal reflux disease)   . Hypertension   . Neuromuscular disorder ([place])    shouler RCT's  . Osteopenia   . Pneumonia    [place]- 01/2015  . Seizures ([place])    6 seizures since 1995, last one 2012, seen in [place], stopeed medicine 3-4 yrs. ago, no seizure since   . Shortness of breath dyspnea   . Sleep apnea 2013   unsure of results  . Wrist fracture, left 01/2016    Past Surgical History:  Procedure Laterality Date  . ANKLE SURGERY    . BREAST SURGERY Left 2012   lumpectomy  . DILATION AND CURETTAGE OF UTERUS    . FOOT SURGERY    . KNEE SURGERY    . REVERSE SHOULDER ARTHROPLASTY Left 02/26/2016   Procedure: REVERSE SHOULDER ARTHROPLASTY;  Surgeon: Tania Ade, MD;  Location: [place];  Service: Orthopedics;  Laterality: Left;  Reverse left shoulder arthroplasty  . REVERSE SHOULDER ARTHROPLASTY Right 05/13/2016   Procedure: REVERSE SHOULDER ARTHROPLASTY;  Surgeon: Tania Ade, MD;  Location: [place];  Service: Orthopedics;  Laterality: Right;  Right reverse total shoulder arthroplasty  . SHOULDER ARTHROSCOPY Right 09/10/2015    Procedure: RIGHT SHOULDER ARTHROSCOPY ;  Surgeon: Dorna Leitz, MD;  Location: [place];  Service: Orthopedics;  Laterality: Right;  . SHOULDER INJECTION Left 09/10/2015   Procedure: LEFT SHOULDER INJECTION;  Surgeon: Dorna Leitz, MD;  Location: [place];  Service: Orthopedics;  Laterality: Left;  . SHOULDER SURGERY    . TONSILLECTOMY    . TOTAL SHOULDER REPLACEMENT Right     There were no vitals filed for this visit.      Subjective Assessment - 07/28/16 1056    Subjective Patient reports ongoing soreness in shoulder, no new complaints today   Pertinent History L TSA 5/17, HTN, OA, asthma   Patient Stated Goals to improve movement   Currently in Pain? Yes   Pain Score 3    Pain Location Shoulder   Pain Orientation Right   Pain Descriptors / Indicators Sore   Pain Type Surgical pain   Pain Onset More than a month ago   Pain Frequency Constant   Aggravating Factors  certain movements   Pain Relieving Factors rest and meds            [facility] PT Assessment - 07/28/16 0001      PROM   PROM Assessment Site Shoulder   Right/Left Shoulder Right   Right Shoulder Flexion 135 Degrees   Right Shoulder External Rotation 48 Degrees  [facility] Adult PT Treatment/Exercise - 07/28/16 0001      Shoulder Exercises: Supine   Other Supine Exercises cane exercises for flexion and ER 3x10 each     Shoulder Exercises: Pulleys   Flexion Other (comment)  82min AAROM   Other Pulley Exercises seated UE ranger for AAROM small flexion and circles x42min     Moist Heat Therapy   Number Minutes Moist Heat 15 Minutes   Moist Heat Location Shoulder     Electrical Stimulation   Electrical Stimulation Location R shoulder    Electrical Stimulation Action IFC   Electrical Stimulation Parameters 80-150hz  x18min   Electrical Stimulation Goals Pain     Manual Therapy   Manual Therapy Passive ROM   Passive ROM to R shoulder in scapular plane: flex, ER with gentle range                      PT Long Term Goals - 07/19/16 1312      PT LONG TERM GOAL #1   Title I with HEP   Baseline no HEP   Time 8   Period Weeks   Status Achieved     PT LONG TERM GOAL #2   Title Improved R shoulder AROM to [facility]   Baseline R shoulder flex 100 deg, ABD 80 deg, IR 54 deg, ER 15 deg.   Time 8   Period Weeks   Status On-going     PT LONG TERM GOAL #3   Title Decreased pain to 2/10 or less with ADLS   Baseline [id number]    Time 8   Period Weeks   Status On-going     PT LONG TERM GOAL #4   Title Demo R shoulder strength of 4+/5 to improve function   Baseline Not tested due to protocol restrictions   Time 8   Period Weeks   Status On-going               Plan - 07/28/16 1104    Clinical Impression Statement Patient progressing overall with improved ROM and no reported increased pain more than normal. Patient able to perform cane exercises independeltly today, Patient tolerated treatment well today. Patient current goals ongoing due to protocaol limitations per MD.   Rehab Potential Good   PT Frequency 3x / week   PT Duration 6 weeks   PT Treatment/Interventions ADLs/Self Care Home Management;Electrical Stimulation;Cryotherapy;Moist Heat;Patient/family education;Therapeutic exercise;Neuromuscular re-education;Manual techniques;Passive range of motion;Vasopneumatic Device   PT Next Visit Plan cont with POC per  ROM (see precautions); no strengthening until 08/16/16, patient only has 2 visits left due to insurance   Consulted and Agree with Plan of Care Patient      Patient will benefit from skilled therapeutic intervention in order to improve the following deficits and impairments:  Decreased range of motion, Pain, Impaired UE functional use, Decreased scar mobility, Decreased strength  Visit Diagnosis: Right shoulder pain, unspecified chronicity  Stiffness of right shoulder, not elsewhere classified     Problem List Patient Active Problem List    Diagnosis Date Noted  . Tobacco abuse 04/15/2016  . 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 Osteoarthritis of right shoulder 09/10/2015  . Rotator cuff tear arthropathy of right shoulder 09/10/2015  . Osteoarthritis of left shoulder 09/10/2015  . Chronic pain syndrome 09/02/2015  . Left shoulder pain 08/14/2015  . Right shoulder pain 08/14/2015  . HTN (hypertension) 08/14/2015  . Healthcare maintenance 08/14/2015  . COPD (chronic obstructive pulmonary disease) (  [place]) 07/17/2015  . Low grade squamous intraepithelial lesion (LGSIL) on cervical Pap smear 01/01/2014    Aleka Twitty P, PTA 07/28/2016, 11:29 AM  [facility] [place], [place], [address] Phone: [phone]   Fax:  [phone]  Name: TAISLEY RAGLIN MRN: [id number] Date of Birth: 1961/01/08

## 2016-07-29 ENCOUNTER — Encounter: Payer: Medicaid Other | Admitting: Physical Therapy

## 2016-07-30 ENCOUNTER — Ambulatory Visit: Payer: Medicaid Other | Admitting: Physical Therapy

## 2016-07-30 DIAGNOSIS — M25611 Stiffness of right shoulder, not elsewhere classified: Secondary | ICD-10-CM

## 2016-07-30 DIAGNOSIS — M25511 Pain in right shoulder: Secondary | ICD-10-CM | POA: Diagnosis not present

## 2016-07-30 NOTE — Therapy (Signed)
[facility] [place], [place], [address] Phone: [phone]   Fax:  [phone]  Physical Therapy Treatment  Patient Details  Name: NOVELL KELLING MRN: [id number] Date of Birth: [birth year] Referring Provider: Grier Mitts, PA-C/Justin Tamera Punt MD  Encounter Date: 07/30/2016      PT End of Session - 07/30/16 1303    PT Start Time 0949   PT Stop Time 1034   PT Time Calculation (min) 45 min   Activity Tolerance Patient tolerated treatment well   Behavior During Therapy [facility] for tasks assessed/performed      Past Medical History:  Diagnosis Date  . Anxiety   . Arthritis    "head to toes"   . Back pain   . COPD (chronic obstructive pulmonary disease) ([place])   . Depression   . GERD (gastroesophageal reflux disease)   . Hypertension   . Neuromuscular disorder ([place])    shouler RCT's  . Osteopenia   . Pneumonia    [place]- 01/2015  . Seizures ([place])    6 seizures since 1995, last one 2012, seen in [place], stopeed medicine 3-4 yrs. ago, no seizure since   . Shortness of breath dyspnea   . Sleep apnea 2013   unsure of results  . Wrist fracture, left 01/2016    Past Surgical History:  Procedure Laterality Date  . ANKLE SURGERY    . BREAST SURGERY Left 2012   lumpectomy  . DILATION AND CURETTAGE OF UTERUS    . FOOT SURGERY    . KNEE SURGERY    . REVERSE SHOULDER ARTHROPLASTY Left 02/26/2016   Procedure: REVERSE SHOULDER ARTHROPLASTY;  Surgeon: Tania Ade, MD;  Location: [place];  Service: Orthopedics;  Laterality: Left;  Reverse left shoulder arthroplasty  . REVERSE SHOULDER ARTHROPLASTY Right 05/13/2016   Procedure: REVERSE SHOULDER ARTHROPLASTY;  Surgeon: Tania Ade, MD;  Location: [place];  Service: Orthopedics;  Laterality: Right;  Right reverse total shoulder arthroplasty  . SHOULDER ARTHROSCOPY Right 09/10/2015   Procedure: RIGHT SHOULDER ARTHROSCOPY ;  Surgeon: Dorna Leitz, MD;  Location: [place];   Service: Orthopedics;  Laterality: Right;  . SHOULDER INJECTION Left 09/10/2015   Procedure: LEFT SHOULDER INJECTION;  Surgeon: Dorna Leitz, MD;  Location: [place];  Service: Orthopedics;  Laterality: Left;  . SHOULDER SURGERY    . TONSILLECTOMY    . TOTAL SHOULDER REPLACEMENT Right     There were no vitals filed for this visit.      Subjective Assessment - 07/30/16 1302    Subjective No new complaints today.   Pain Score 3    Pain Location Shoulder   Pain Orientation Right   Pain Descriptors / Indicators Sore   Pain Type Surgical pain   Pain Onset More than a month ago     Treatment:  Pulleys x 7 minutes f/b supine cane exercise in bench press and flexion motion x 3 minutes f/b right shoulder PAROM and low load long duration stretching (PROM) in plane of scapula into ER (total:  15 minutes) f/b HMP and e' stim x 15 minutes.                                 PT Long Term Goals - 07/19/16 1312      PT LONG TERM GOAL #1   Title I with HEP   Baseline no HEP   Time 8   Period Weeks   Status  Achieved     PT LONG TERM GOAL #2   Title Improved R shoulder AROM to [facility]   Baseline R shoulder flex 100 deg, ABD 80 deg, IR 54 deg, ER 15 deg.   Time 8   Period Weeks   Status On-going     PT LONG TERM GOAL #3   Title Decreased pain to 2/10 or less with ADLS   Baseline [id number]    Time 8   Period Weeks   Status On-going     PT LONG TERM GOAL #4   Title Demo R shoulder strength of 4+/5 to improve function   Baseline Not tested due to protocol restrictions   Time 8   Period Weeks   Status On-going             Patient will benefit from skilled therapeutic intervention in order to improve the following deficits and impairments:  Decreased range of motion, Pain, Impaired UE functional use, Decreased scar mobility, Decreased strength  Visit Diagnosis: Right shoulder pain, unspecified chronicity  Stiffness of right shoulder, not elsewhere  classified  Acute pain of right shoulder     Problem List Patient Active Problem List   Diagnosis Date Noted  . Tobacco abuse 04/15/2016  . 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 Osteoarthritis of right shoulder 09/10/2015  . Rotator cuff tear arthropathy of right shoulder 09/10/2015  . Osteoarthritis of left shoulder 09/10/2015  . Chronic pain syndrome 09/02/2015  . Left shoulder pain 08/14/2015  . Right shoulder pain 08/14/2015  . HTN (hypertension) 08/14/2015  . Healthcare maintenance 08/14/2015  . COPD (chronic obstructive pulmonary disease) ([place]) 07/17/2015  . Low grade squamous intraepithelial lesion (LGSIL) on cervical Pap smear 01/01/2014    Marieann Zipp, [place] MPT 07/30/2016, 1:04 PM  [facility] [place], [place], [address] Phone: [phone]   Fax:  [phone]  Name: ALYLA NIEDERHAUSER MRN: [id number] Date of Birth: 12-Sep-1961

## 2016-08-02 ENCOUNTER — Other Ambulatory Visit: Payer: Self-pay | Admitting: Family Medicine

## 2016-08-02 DIAGNOSIS — J441 Chronic obstructive pulmonary disease with (acute) exacerbation: Secondary | ICD-10-CM

## 2016-08-03 ENCOUNTER — Encounter: Payer: Self-pay | Admitting: Physical Therapy

## 2016-08-03 ENCOUNTER — Ambulatory Visit: Payer: Medicaid Other | Admitting: Physical Therapy

## 2016-08-03 DIAGNOSIS — M25611 Stiffness of right shoulder, not elsewhere classified: Secondary | ICD-10-CM

## 2016-08-03 DIAGNOSIS — M25511 Pain in right shoulder: Secondary | ICD-10-CM | POA: Diagnosis not present

## 2016-08-03 NOTE — Therapy (Signed)
[facility] [place], [place], [address] Phone: [phone]   Fax:  [phone]  Physical Therapy Treatment  Patient Details  Name: Brenda Duran MRN: [id number] Date of Birth: [birth year] Referring Provider: Grier Mitts, PA-C/Justin Tamera Punt MD  Encounter Date: 08/03/2016    Past Medical History:  Diagnosis Date  . Anxiety   . Arthritis    "head to toes"   . Back pain   . COPD (chronic obstructive pulmonary disease) ([place])   . Depression   . GERD (gastroesophageal reflux disease)   . Hypertension   . Neuromuscular disorder ([place])    shouler RCT's  . Osteopenia   . Pneumonia    [place]- 01/2015  . Seizures ([place])    6 seizures since 1995, last one 2012, seen in [place], stopeed medicine 3-4 yrs. ago, no seizure since   . Shortness of breath dyspnea   . Sleep apnea 2013   unsure of results  . Wrist fracture, left 01/2016    Past Surgical History:  Procedure Laterality Date  . ANKLE SURGERY    . BREAST SURGERY Left 2012   lumpectomy  . DILATION AND CURETTAGE OF UTERUS    . FOOT SURGERY    . KNEE SURGERY    . REVERSE SHOULDER ARTHROPLASTY Left 02/26/2016   Procedure: REVERSE SHOULDER ARTHROPLASTY;  Surgeon: Tania Ade, MD;  Location: [place];  Service: Orthopedics;  Laterality: Left;  Reverse left shoulder arthroplasty  . REVERSE SHOULDER ARTHROPLASTY Right 05/13/2016   Procedure: REVERSE SHOULDER ARTHROPLASTY;  Surgeon: Tania Ade, MD;  Location: [place];  Service: Orthopedics;  Laterality: Right;  Right reverse total shoulder arthroplasty  . SHOULDER ARTHROSCOPY Right 09/10/2015   Procedure: RIGHT SHOULDER ARTHROSCOPY ;  Surgeon: Dorna Leitz, MD;  Location: [place];  Service: Orthopedics;  Laterality: Right;  . SHOULDER INJECTION Left 09/10/2015   Procedure: LEFT SHOULDER INJECTION;  Surgeon: Dorna Leitz, MD;  Location: [place];  Service: Orthopedics;  Laterality: Left;  . SHOULDER SURGERY    .  TONSILLECTOMY    . TOTAL SHOULDER REPLACEMENT Right     There were no vitals filed for this visit.                                    PT Long Term Goals - 08/03/16 0900      PT LONG TERM GOAL #1   Title I with HEP   Baseline no HEP   Time 8   Period Weeks   Status Achieved     PT LONG TERM GOAL #2   Title Improved R shoulder AROM to [facility]   Baseline R shoulder flex 100 deg, ABD 80 deg, IR 54 deg, ER 15 deg.   Time 8   Period Weeks   Status Not Met  AAROM R shoulder flex 132 deg, IR 62 deg, ER 50 deg in supine 08/03/2016     PT LONG TERM GOAL #3   Title Decreased pain to 2/10 or less with ADLS   Baseline 2/77    Time 8   Period Weeks   Status Achieved  "Around 2/10" and has [place] aide per patient report 08/03/2016     PT LONG TERM GOAL #4   Title Demo R shoulder strength of 4+/5 to improve function   Baseline Not tested due to protocol restrictions   Time 8   Period Weeks   Status Unable to assess  No strengthening completed per protocol 08/03/2016             Patient will benefit from skilled therapeutic intervention in order to improve the following deficits and impairments:  Decreased range of motion, Pain, Impaired UE functional use, Decreased scar mobility, Decreased strength  Visit Diagnosis: Right shoulder pain, unspecified chronicity  Stiffness of right shoulder, not elsewhere classified     Problem List Patient Active Problem List   Diagnosis Date Noted  . Tobacco abuse 04/15/2016  . 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 Osteoarthritis of right shoulder 09/10/2015  . Rotator cuff tear arthropathy of right shoulder 09/10/2015  . Osteoarthritis of left shoulder 09/10/2015  . Chronic pain syndrome 09/02/2015  . Left shoulder pain 08/14/2015  . Right shoulder pain 08/14/2015  . HTN (hypertension) 08/14/2015  . Healthcare maintenance 08/14/2015  . COPD (chronic obstructive pulmonary disease) ([place])  07/17/2015  . Low grade squamous intraepithelial lesion (LGSIL) on cervical Pap smear 01/01/2014    Ahmed Prima, PTA 08/04/16 6:06 PM  [facility] [place], [place], [address] Phone: [phone]   Fax:  [phone]  Name: Brenda Duran MRN: [id number] Date of Birth: Nov 12, 1960  PHYSICAL THERAPY DISCHARGE SUMMARY  Visits from Start of Care: 8.  Current functional level related to goals / functional outcomes: Please see above.   Remaining deficits: Continued loss of right shoulder ROM. Patient progressed per protocol.  Patient limited by Medicaid visit limits.    Education / Equipment: HEP. Plan: Patient agrees to discharge.  Patient goals were not met. Patient is being discharged due to financial reasons.  ?????         [place] Applegate MPT

## 2016-08-04 ENCOUNTER — Other Ambulatory Visit: Payer: Self-pay | Admitting: *Deleted

## 2016-08-04 DIAGNOSIS — J42 Unspecified chronic bronchitis: Secondary | ICD-10-CM

## 2016-08-04 MED ORDER — TIOTROPIUM BROMIDE MONOHYDRATE 18 MCG IN CAPS: 18.0000 ug | ORAL_CAPSULE | Freq: Every day | RESPIRATORY_TRACT | 5 refills | Status: AC

## 2016-08-10 ENCOUNTER — Encounter: Payer: Medicaid Other | Admitting: Podiatry

## 2016-08-10 NOTE — Progress Notes (Signed)
This encounter was created in error - please disregard.

## 2016-08-16 ENCOUNTER — Ambulatory Visit (INDEPENDENT_AMBULATORY_CARE_PROVIDER_SITE_OTHER): Payer: Medicaid Other | Admitting: Family Medicine

## 2016-08-16 ENCOUNTER — Encounter: Payer: Self-pay | Admitting: Family Medicine

## 2016-08-16 VITALS — BP 114/87 | HR 111 | Temp 98.6°F | Ht 59.0 in | Wt 134.6 lb

## 2016-08-16 DIAGNOSIS — Z720 Tobacco use: Secondary | ICD-10-CM

## 2016-08-16 DIAGNOSIS — M25512 Pain in left shoulder: Secondary | ICD-10-CM

## 2016-08-16 DIAGNOSIS — G8929 Other chronic pain: Secondary | ICD-10-CM

## 2016-08-16 DIAGNOSIS — J449 Chronic obstructive pulmonary disease, unspecified: Secondary | ICD-10-CM

## 2016-08-16 DIAGNOSIS — M25511 Pain in right shoulder: Secondary | ICD-10-CM | POA: Diagnosis not present

## 2016-08-16 DIAGNOSIS — M79642 Pain in left hand: Secondary | ICD-10-CM | POA: Diagnosis not present

## 2016-08-16 DIAGNOSIS — M79641 Pain in right hand: Secondary | ICD-10-CM

## 2016-08-16 MED ORDER — IBUPROFEN 800 MG PO TABS: 800.0000 mg | ORAL_TABLET | Freq: Three times a day (TID) | ORAL | 2 refills | Status: DC | PRN

## 2016-08-16 MED ORDER — VARENICLINE TARTRATE 1 MG PO TABS: 1.0000 mg | ORAL_TABLET | Freq: Two times a day (BID) | ORAL | 0 refills | Status: AC

## 2016-08-16 MED ORDER — DICLOFENAC SODIUM 1 % TD GEL: 2.0000 g | Freq: Four times a day (QID) | TRANSDERMAL | 5 refills | Status: AC

## 2016-08-16 NOTE — Progress Notes (Signed)
   HPI  Patient presents today here for routine follow-up chronic medical conditions.  Chronic shoulder pain Previous/recent shoulder surgery. Improved quite a bit, she tried 100 mg of ibuprofen at home and had a lot of improvement She would like a prescription.  Lines of bilateral hand aching pain that happens worse in the wintertime. Understands this is likely arthritis.  COPD Breathing is at baseline Good compliance with inhalers She is trying to quit smoking but has not really cut back. She needs a refill of Chantix, she has tolerated 1 mg twice daily without a problem.  PMH: Smoking status noted ROS: Per HPI  Objective: BP 114/87   Pulse (!) 111   Temp 98.6 F (37 C) (Oral)   Ht 4\' 11"  (1.499 m)   Wt 134 lb 9.6 oz (61.1 kg)   BMI 27.19 kg/m  Gen: NAD, alert, cooperative with exam HEENT: NCAT, EOMI, PERRL CV: RRR, good S1/S2, no murmur Resp: CTABL, no wheezes, non-labored Ext: No edema, warm Neuro: Alert and oriented  Assessment and plan:  # COPD At baseline Continue current inhaler regimen including Dulera, spiriva, and Combivent. 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 varenicline (CHANTIX CONTINUING MONTH PAK) 1 MG tablet    Sig: Take 1 tablet (1 mg total) by mouth 2 (two) times daily.    Dispense:  60 tablet    Refill:  0  . diclofenac sodium (VOLTAREN) 1 % GEL    Sig: Apply 2 g topically 4 (four) times daily.    Dispense:  100 g    Refill:  5  . ibuprofen (ADVIL,MOTRIN) 800 MG tablet    Sig: Take 1 tablet (800 mg total) by mouth every 8 (eight) hours as needed.    Dispense:  45  tablet    Refill:  [place], MD [facility] 08/16/2016, 9:34 AM

## 2016-08-16 NOTE — Patient Instructions (Signed)
Great to see you!  Try to keep ibuprofen to once daily, there is enough to take an extra one on half the days of the month  Try diclofenac gel for your hands  I have refilled chantix, consider calling 1800 quit now for help to quit smoking

## 2016-08-26 ENCOUNTER — Ambulatory Visit (INDEPENDENT_AMBULATORY_CARE_PROVIDER_SITE_OTHER): Payer: Medicaid Other | Admitting: Podiatry

## 2016-08-26 ENCOUNTER — Encounter: Payer: Self-pay | Admitting: Podiatry

## 2016-08-26 DIAGNOSIS — M2032 Hallux varus (acquired), left foot: Secondary | ICD-10-CM

## 2016-08-26 DIAGNOSIS — M2041 Other hammer toe(s) (acquired), right foot: Secondary | ICD-10-CM

## 2016-08-26 DIAGNOSIS — B351 Tinea unguium: Secondary | ICD-10-CM | POA: Diagnosis not present

## 2016-08-26 DIAGNOSIS — Q828 Other specified congenital malformations of skin: Secondary | ICD-10-CM

## 2016-08-26 DIAGNOSIS — M2042 Other hammer toe(s) (acquired), left foot: Secondary | ICD-10-CM

## 2016-08-26 NOTE — Progress Notes (Signed)
She presents today with chief complaint of painful calluses to the second toe right foot and to the medial aspect of the hallux nail left. She is also complaining of a toenail grew bacteria on her fifth toe where we removed previously.  Objective: Vital signs are stable 3 pulses are palp. She is a smoker who is complaining of a painful clavus to the distal aspect of the second toe. Small other multiple porokeratotic lesions are noted.  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  Plan: Debrided already hyperkeratoses for her today. Follow up with her in a few weeks for surgical consult regarding fusing the right second toe.

## 2016-09-01 ENCOUNTER — Telehealth: Payer: Self-pay | Admitting: Pulmonary Disease

## 2016-09-01 ENCOUNTER — Other Ambulatory Visit: Payer: Self-pay | Admitting: Family Medicine

## 2016-09-01 DIAGNOSIS — F32A Depression, unspecified: Secondary | ICD-10-CM

## 2016-09-01 DIAGNOSIS — J411 Mucopurulent chronic bronchitis: Secondary | ICD-10-CM

## 2016-09-01 DIAGNOSIS — F329 Major depressive disorder, single episode, unspecified: Secondary | ICD-10-CM

## 2016-09-01 NOTE — Telephone Encounter (Signed)
lmtcb for [employer].

## 2016-09-02 NOTE — Telephone Encounter (Signed)
LMTCB for Brenda Duran.

## 2016-09-03 NOTE — Telephone Encounter (Signed)
Spoke with Brenda Duran, who is requesting a OV note tp be faxed to provided number. Brenda Duran also requested our fax number, I have provided her with this number as well. OV note faxed and confirmation received. Nothing further needed.

## 2016-09-08 ENCOUNTER — Telehealth: Payer: Self-pay | Admitting: Pulmonary Disease

## 2016-09-08 NOTE — Telephone Encounter (Signed)
Spoke with [facility] [place] just checking on the status of the forms they sent over for the pt. To recertified for oxygen.  Message will be forwarded to [facility] to follow up on.

## 2016-09-13 NOTE — Telephone Encounter (Signed)
Brenda Duran, has this form been received? Thanks.

## 2016-09-16 ENCOUNTER — Ambulatory Visit (INDEPENDENT_AMBULATORY_CARE_PROVIDER_SITE_OTHER): Payer: Medicaid Other | Admitting: Podiatry

## 2016-09-16 ENCOUNTER — Encounter: Payer: Self-pay | Admitting: Podiatry

## 2016-09-16 DIAGNOSIS — M2041 Other hammer toe(s) (acquired), right foot: Secondary | ICD-10-CM

## 2016-09-16 DIAGNOSIS — Q828 Other specified congenital malformations of skin: Secondary | ICD-10-CM

## 2016-09-16 DIAGNOSIS — M2042 Other hammer toe(s) (acquired), left foot: Secondary | ICD-10-CM | POA: Diagnosis not present

## 2016-09-16 NOTE — Patient Instructions (Signed)

## 2016-09-17 NOTE — Progress Notes (Signed)
She presents today chief complaint of pain to the second digit of the right foot.  Objective: Vital signs are stable alert and oriented 3 pulses remain palpable. She is severe pain distal aspect second digit right foot. Porokeratotic lesion is present. No open lesions or wounds are noted.  Assessment: Pain in limb with hammertoe deformity distal clavus second digit right foot.  Plan: Surgical consent form was signed today regarding hammertoe repair with screw or pin fixation second digit right foot. We discussed pros and cons of the surgery including but not limited to postop pain bleeding swelling infection recurrence and need for further surgery overcorrection and under correction loss of digit loss of limb loss of life. We are requesting medical clearance and I will follow up with her once this is taken place.

## 2016-09-21 NOTE — Telephone Encounter (Signed)
[name] please advise an update on this phone note. thanks

## 2016-09-21 NOTE — Telephone Encounter (Signed)
Called [facility] and spoke with Lucia Estelle CMN was already faxed back to [facility]  Jaretta just needs ov note stating pt needs to continue O2 - pt's lastov was 03/2016 where nocturnal O2 was ordered.  Pt is due for appt in Dec 2017 but no appt has yet to be scheduled.  Lucia Estelle stated she will contact pt's PCP (who referred her here) to see if they have an ov note that can be used.  Nothing further needed; will sign off

## 2016-09-30 ENCOUNTER — Ambulatory Visit: Payer: Medicaid Other | Admitting: Pulmonary Disease

## 2016-10-01 ENCOUNTER — Other Ambulatory Visit: Payer: Self-pay | Admitting: Family Medicine

## 2016-10-01 DIAGNOSIS — F32A Depression, unspecified: Secondary | ICD-10-CM

## 2016-10-01 DIAGNOSIS — J411 Mucopurulent chronic bronchitis: Secondary | ICD-10-CM

## 2016-10-01 DIAGNOSIS — F329 Major depressive disorder, single episode, unspecified: Secondary | ICD-10-CM

## 2016-10-05 ENCOUNTER — Telehealth: Payer: Self-pay | Admitting: *Deleted

## 2016-10-05 NOTE — Telephone Encounter (Signed)
"  I have surgery scheduled for January 14.  I need to reschedule it a few months out to mid April.  He can do it on April 13 or 20.  "Let's do it on the 20th."

## 2016-10-28 ENCOUNTER — Other Ambulatory Visit: Payer: Self-pay | Admitting: Orthopedic Surgery

## 2016-10-29 ENCOUNTER — Encounter: Payer: Self-pay | Admitting: Family Medicine

## 2016-10-29 ENCOUNTER — Ambulatory Visit (INDEPENDENT_AMBULATORY_CARE_PROVIDER_SITE_OTHER): Payer: Medicaid Other | Admitting: Family Medicine

## 2016-10-29 VITALS — BP 117/86 | HR 103 | Temp 98.3°F | Ht 59.0 in | Wt 131.8 lb

## 2016-10-29 DIAGNOSIS — J441 Chronic obstructive pulmonary disease with (acute) exacerbation: Secondary | ICD-10-CM | POA: Diagnosis not present

## 2016-10-29 MED ORDER — PREDNISONE 10 MG PO TABS: ORAL_TABLET | ORAL | 0 refills | Status: DC

## 2016-10-29 MED ORDER — ALBUTEROL SULFATE HFA 108 (90 BASE) MCG/ACT IN AERS: 2.0000 | INHALATION_SPRAY | Freq: Four times a day (QID) | RESPIRATORY_TRACT | 2 refills | Status: AC | PRN

## 2016-10-29 MED ORDER — DOXYCYCLINE HYCLATE 100 MG PO TABS
100.0000 mg | ORAL_TABLET | Freq: Two times a day (BID) | ORAL | 0 refills | Status: AC
Start: 2016-10-29 — End: ?

## 2016-10-29 NOTE — Patient Instructions (Signed)
Great to see you!  Come back with any concerns.   Please finish all antibiotics   Acute Bronchitis, Adult Acute bronchitis is when air tubes (bronchi) in the lungs suddenly get swollen. The condition can make it hard to breathe. It can also cause these symptoms:  A cough.  Coughing up clear, yellow, or green mucus.  Wheezing.  Chest congestion.  Shortness of breath.  A fever.  Body aches.  Chills.  A sore throat. Follow these instructions at home: Medicines  Take over-the-counter and prescription medicines only as told by your doctor.  If you were prescribed an antibiotic medicine, take it as told by your doctor. Do not stop taking the antibiotic even if you start to feel better. General instructions  Rest.  Drink enough fluids to keep your pee (urine) clear or pale yellow.  Avoid smoking and secondhand smoke. If you smoke and you need help quitting, ask your doctor. Quitting will help your lungs heal faster.  Use an inhaler, cool mist vaporizer, or humidifier as told by your doctor.  Keep all follow-up visits as told by your doctor. This is important. How is this prevented? To lower your risk of getting this condition again:  Wash your hands often with soap and water. If you cannot use soap and water, use hand sanitizer.  Avoid contact with people who have cold symptoms.  Try not to touch your hands to your mouth, nose, or eyes.  Make sure to get the flu shot every year. Contact a doctor if:  Your symptoms do not get better in 2 weeks. Get help right away if:  You cough up blood.  You have chest pain.  You have very bad shortness of breath.  You become dehydrated.  You faint (pass out) or keep feeling like you are going to pass out.  You keep throwing up (vomiting).  You have a very bad headache.  Your fever or chills gets worse. This information is not intended to replace advice given to you by your health care provider. Make sure you  discuss any questions you have with your health care provider. Document Released: 03/22/2008 Document Revised: 05/12/2016 Document Reviewed: 03/24/2016 Elsevier Interactive Patient Education  2017 [employer].

## 2016-10-29 NOTE — Progress Notes (Signed)
   HPI  Patient presents today here with acute illness.  Patient explains that she had an illness starting about one month ago she thought was the flu. She states it lasted a few weeks and she had improvement for the current illness. About one week ago she developed cough, congestion, frequent sneezing. And increased shortness of breath. She is using all of her controller inhalers, albuterol still helpful.  Refill of albuterol inhaler.  She's tolerating food and fluids normally.  Patient has planned surgery coming up in about 3-4 weeks. She was likely healthy previous to that surgery.  PMH: Smoking status noted ROS: Per HPI  Objective: BP 117/86   Pulse (!) 103   Temp 98.3 F (36.8 C) (Oral)   Ht 4\' 11"  (1.499 m)   Wt 131 lb 12.8 oz (59.8 kg)   BMI 26.62 kg/m  Gen: NAD, alert, cooperative with exam HEENT: NCAT CV: RRR, good S1/S2, no murmur Resp:decreased but reasonable air movement, a few scattered wheezes Ext: No edema, warm Neuro: Alert and oriented  Assessment and plan:  # COPD exacerbation Treated with prednisone and doxycycline Also given albuterol inhaler, as requested by patient, however we discussed using Combivent that she already has which she will use preferentially. Low threshold for return, patient has follow-up with pulmonology    Meds ordered this encounter  Medications  . predniSONE (DELTASONE) 10 MG tablet    Sig: Take 4 pills a day for 3 days, then 3 pills a day for 3 days, then 2 pills a day for 3 days, then 1 pill a day for 3 days, then stop    Dispense:  30 tablet    Refill:  0  . doxycycline (VIBRA-TABS) 100 MG tablet    Sig: Take 1 tablet (100 mg total) by mouth 2 (two) times daily. 1 po bid    Dispense:  20 tablet    Refill:  0    Brenda Apple, MD [facility] 10/29/2016, 10:38 AM

## 2016-11-04 ENCOUNTER — Ambulatory Visit: Payer: Medicaid Other | Admitting: Pulmonary Disease

## 2016-11-04 IMAGING — CR DG SHOULDER 2+V PORT*R*
1 series · 1 of 1 positions shown · non-contrast
Comparison: 11/15/2015

CLINICAL DATA: S/p reverse total arthroplasty. PACU post op

EXAM:
PORTABLE RIGHT SHOULDER - 2+ VIEW

[AP]
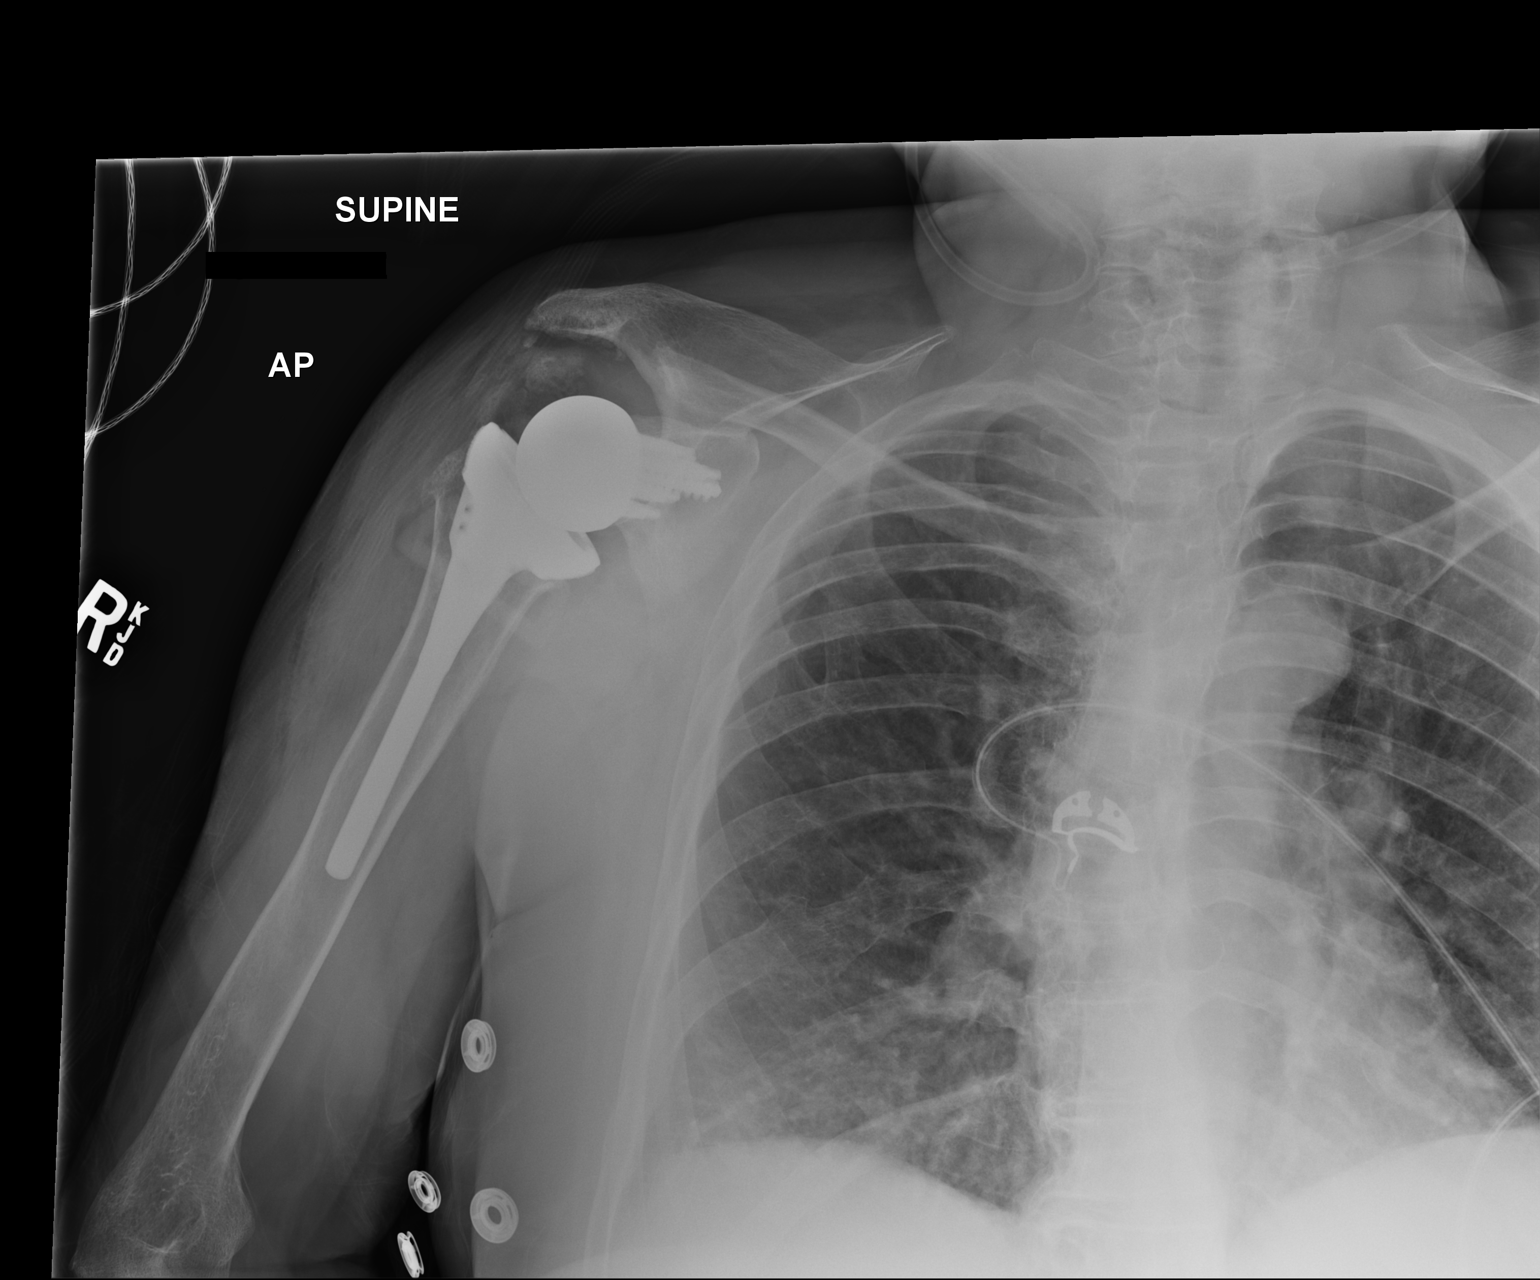

[1 of 1 positions shown; findings below may reference images not displayed]

FINDINGS: Status post reverse shoulder arthroplasty. Postoperative gas is
identified in the region the shoulder. The humeral component appears
well seated over the glenoid component on the frontal view provided.
No acute fracture.
IMPRESSION: Interval reverse shoulder arthroplasty. No adverse features
identified.

## 2016-11-08 ENCOUNTER — Other Ambulatory Visit: Payer: Self-pay | Admitting: Family Medicine

## 2016-11-11 NOTE — Pre-Procedure Instructions (Signed)
    ZAYDA CASE  11/11/2016      [facility] [place] [address] [place]  [address] Phone: [phone] Fax: [phone]    Your procedure is scheduled on 11-18-2016    Thursday .  Report to [facility] Admitting at 5:30 A.M.   Call this number if you have problems the morning of surgery:  [phone]   Remember:  Do not eat food or drink liquids after midnight.   Take these medicines the morning of surgery with A SIP OF WATER pain medication as needed,inhalers as prescribed by MD,Amlodipine(Norvasc),Duloxetine(Cymbalta),Methocarbomol(Robaxin)   STOP ASPIRIN,ANTIINFLAMATORIES (IBUPROFEN,ALEVE,MOTRIN,ADVIL,GOODY'S POWDERS),HERBAL SUPPLEMENTS,FISH OIL,AND VITAMINS 5-7 DAYS PRIOR TO SURGERY   Do not wear jewelry, make-up or nail polish.  Do not wear lotions, powders, or perfumes, or deoderant.  Do not shave 48 hours prior to surgery.  .  Do not bring valuables to the hospital.  [facility] is not responsible for any belongings or valuables.  Contacts, dentures or bridgework may not be worn into surgery.  Leave your suitcase in the car.  After surgery it may be brought to your room.  For patients admitted to the hospital, discharge time will be determined by your treatment team.  Patients discharged the day of surgery will not be allowed to drive home.    Special instructions:  See attached Sheet for instructions on CHG showers  Please read over the following fact sheets that you were given. Incentive Spirometry

## 2016-11-12 ENCOUNTER — Ambulatory Visit (HOSPITAL_COMMUNITY)
Admission: RE | Admit: 2016-11-12 | Discharge: 2016-11-12 | Disposition: A | Discharge status: Home / Self Care | Payer: Medicaid Other | Source: Ambulatory Visit | Attending: Orthopedic Surgery | Admitting: Orthopedic Surgery

## 2016-11-12 ENCOUNTER — Other Ambulatory Visit (HOSPITAL_COMMUNITY): Payer: Self-pay | Admitting: *Deleted

## 2016-11-12 ENCOUNTER — Encounter (HOSPITAL_COMMUNITY)
Admission: RE | Admit: 2016-11-12 | Discharge: 2016-11-12 | Disposition: A | Discharge status: Home / Self Care | Payer: Medicaid Other | Source: Ambulatory Visit | Attending: Orthopedic Surgery | Admitting: Orthopedic Surgery

## 2016-11-12 ENCOUNTER — Encounter (HOSPITAL_COMMUNITY): Payer: Self-pay

## 2016-11-12 DIAGNOSIS — Z01810 Encounter for preprocedural cardiovascular examination: Secondary | ICD-10-CM | POA: Diagnosis not present

## 2016-11-12 DIAGNOSIS — Z01818 Encounter for other preprocedural examination: Secondary | ICD-10-CM

## 2016-11-12 DIAGNOSIS — Z01812 Encounter for preprocedural laboratory examination: Secondary | ICD-10-CM | POA: Insufficient documentation

## 2016-11-12 HISTORY — DX: Fibromyalgia: M79.7

## 2016-11-12 LAB — APTT: aPTT: 25 seconds (ref 24–36)

## 2016-11-12 LAB — URINALYSIS, ROUTINE W REFLEX MICROSCOPIC
Glucose, UA: NEGATIVE mg/dL
Hgb urine dipstick: NEGATIVE
Ketones, ur: 5 mg/dL — AB
Leukocytes, UA: NEGATIVE
NITRITE: NEGATIVE
PROTEIN: NEGATIVE mg/dL
SPECIFIC GRAVITY, URINE: 1.026 (ref 1.005–1.030)
pH: 6 (ref 5.0–8.0)

## 2016-11-12 LAB — CBC WITH DIFFERENTIAL/PLATELET
BASOS PCT: 0 %
Basophils Absolute: 0 10*3/uL (ref 0.0–0.1)
EOS ABS: 0 10*3/uL (ref 0.0–0.7)
Eosinophils Relative: 0 %
HCT: 53.3 % — ABNORMAL HIGH (ref 36.0–46.0)
Hemoglobin: 17.1 g/dL — ABNORMAL HIGH (ref 12.0–15.0)
Lymphocytes Relative: 9 %
Lymphs Abs: 1.2 10*3/uL (ref 0.7–4.0)
MCH: 29.4 pg (ref 26.0–34.0)
MCHC: 32.1 g/dL (ref 30.0–36.0)
MCV: 91.7 fL (ref 78.0–100.0)
MONO ABS: 0.3 10*3/uL (ref 0.1–1.0)
MONOS PCT: 2 %
NEUTROS PCT: 89 %
Neutro Abs: 11.7 10*3/uL — ABNORMAL HIGH (ref 1.7–7.7)
PLATELETS: 250 10*3/uL (ref 150–400)
RBC: 5.81 MIL/uL — ABNORMAL HIGH (ref 3.87–5.11)
RDW: 18.4 % — AB (ref 11.5–15.5)
WBC: 13.2 10*3/uL — ABNORMAL HIGH (ref 4.0–10.5)

## 2016-11-12 LAB — COMPREHENSIVE METABOLIC PANEL
ALBUMIN: 4 g/dL (ref 3.5–5.0)
ALT: 17 U/L (ref 14–54)
ANION GAP: 10 (ref 5–15)
AST: 23 U/L (ref 15–41)
Alkaline Phosphatase: 71 U/L (ref 38–126)
BUN: 19 mg/dL (ref 6–20)
CO2: 27 mmol/L (ref 22–32)
Calcium: 9.8 mg/dL (ref 8.9–10.3)
Chloride: 103 mmol/L (ref 101–111)
Creatinine, Ser: 0.65 mg/dL (ref 0.44–1.00)
GFR calc Af Amer: >60 mL/min (ref >60)
GFR calc non Af Amer: >60 mL/min (ref >60)
GLUCOSE: 113 mg/dL — AB (ref 65–99)
POTASSIUM: 4.6 mmol/L (ref 3.5–5.1)
SODIUM: 140 mmol/L (ref 135–145)
Total Bilirubin: 0.6 mg/dL (ref 0.3–1.2)
Total Protein: 6.9 g/dL (ref 6.5–8.1)

## 2016-11-12 LAB — PROTIME-INR
INR: 0.99
Prothrombin Time: 13.1 seconds (ref 11.4–15.2)

## 2016-11-12 LAB — SURGICAL PCR SCREEN
MRSA, PCR: NEGATIVE
Staphylococcus aureus: NEGATIVE

## 2016-11-17 NOTE — Anesthesia Preprocedure Evaluation (Addendum)
Anesthesia Evaluation  Patient identified by MRN, date of birth, ID band Patient awake    Reviewed: Allergy & Precautions, H&P , NPO status , Patient's Chart, lab work & pertinent test results  Airway Mallampati: II  TM Distance: >3 FB     Dental no notable dental hx. (+) Edentulous Upper, Edentulous Lower, Dental Advisory Given   Pulmonary COPD,  COPD inhaler, Current Smoker,    Pulmonary exam normal breath sounds clear to auscultation       Cardiovascular Exercise Tolerance: Good hypertension, Pt. on medications  Rhythm:Regular Rate:Normal     Neuro/Psych Seizures -, Well Controlled,  Anxiety Depression    GI/Hepatic Neg liver ROS, GERD  Medicated and Controlled,  Endo/Other  negative endocrine ROS  Renal/GU negative Renal ROS  negative genitourinary   Musculoskeletal  (+) Arthritis , Osteoarthritis,  Fibromyalgia -  Abdominal   Peds  Hematology negative hematology ROS (+)   Anesthesia Other Findings   Reproductive/Obstetrics negative OB ROS                            Anesthesia Physical Anesthesia Plan  ASA: III  Anesthesia Plan: General   Post-op Pain Management:    Induction: Intravenous  Airway Management Planned: Oral ETT and Video Laryngoscope Planned  Additional Equipment:   Intra-op Plan:   Post-operative Plan: Extubation in OR  Informed Consent: I have reviewed the patients History and Physical, chart, labs and discussed the procedure including the risks, benefits and alternatives for the proposed anesthesia with the patient or authorized representative who has indicated his/her understanding and acceptance.   Dental advisory given  Plan Discussed with: CRNA  Anesthesia Plan Comments:         Anesthesia Quick Evaluation

## 2016-11-17 NOTE — H&P (Signed)
PREOPERATIVE H&P  Chief Complaint: Deterioration in dexterity, hand numbness  HPI: Brenda Duran is a 56 y.o. 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 Anxiety   . Arthritis    "head to toes"   . Back pain   . COPD (chronic obstructive pulmonary disease) ([place])   . Depression   . Fibromyalgia   . GERD (gastroesophageal reflux disease)   . Hypertension   . Neuromuscular disorder ([place])    shouler RCT's  . Osteopenia   . Pneumonia    [place]- 01/2015  . Seizures ([place])    6 seizures since 1995, last one 2012, seen in [place], stopeed medicine 3-4 yrs. ago, no seizure since   . Shortness of breath dyspnea   . Sleep apnea 2013   unsure of results  . Wrist fracture, left 01/2016   Past Surgical History:  Procedure Laterality Date  . ANKLE SURGERY    . BREAST SURGERY Left 2012   lumpectomy  . DILATION AND CURETTAGE OF UTERUS    . FOOT SURGERY    . JOINT REPLACEMENT Bilateral    shoulder  . KNEE SURGERY    . REVERSE SHOULDER ARTHROPLASTY Left 02/26/2016   Procedure: REVERSE SHOULDER ARTHROPLASTY;  Surgeon: Tania Ade, MD;  Location: [facility];  Service: Orthopedics;  Laterality: Left;  Reverse left shoulder arthroplasty  . REVERSE SHOULDER ARTHROPLASTY Right 05/13/2016   Procedure: REVERSE SHOULDER ARTHROPLASTY;  Surgeon: Tania Ade, MD;  Location: [facility];  Service: Orthopedics;  Laterality: Right;  Right reverse total shoulder arthroplasty  . SHOULDER ARTHROSCOPY Right 09/10/2015   Procedure: RIGHT SHOULDER ARTHROSCOPY ;  Surgeon: Dorna Leitz, MD;  [facility];  Service: Orthopedics;  Laterality: Right;  . SHOULDER INJECTION Left 09/10/2015   Procedure: LEFT SHOULDER INJECTION;   Surgeon: Dorna Leitz, MD;  Location: [facility];  Service: Orthopedics;  Laterality: Left;  . SHOULDER SURGERY    . TONSILLECTOMY    . TOTAL SHOULDER REPLACEMENT Right    Social History   Social History  . Marital status: Single    Spouse name: N/A  . Number of children: N/A  . Years of education: N/A   Social History Main Topics  . Smoking status: Current Every Day Smoker    Packs/day: 1.00    Years: 35.00    Types: Cigarettes  . Smokeless tobacco: Never Used  . Alcohol use 1.8 oz/week    3 Cans of beer per week     Comment: occ, 40 oz  beeers at one time, ususally 1x week  . Drug use: Yes    Types: Marijuana, Cocaine     Comment: marijuana, crack cocaine- (last use on marijuana - 09/04/2015), last cocaine use 09/03/2015,last marjuaina 02/17/16  . Sexual activity: Not on file   Other Topics Concern  . Not on file   Social History Narrative   Lives with son   Disabled, mostly restaurant work   Family History  Problem Relation Age of Onset  . Cancer Mother     leukemia  . Cancer Father     lung  . Colon cancer Neg Hx   . Esophageal cancer Neg Hx   . Rectal cancer Neg Hx   .  Stomach cancer Neg Hx    Allergies  Allergen Reactions  . No Known Allergies    Prior to Admission medications   Medication Sig Start Date End Date Taking? Authorizing Provider  acetaminophen-codeine (TYLENOL #3) 300-30 MG tablet Take 1 tablet by mouth every 6 (six) hours as needed for moderate pain.   Yes Historical Provider, MD  albuterol (PROVENTIL HFA;VENTOLIN HFA) 108 (90 Base) MCG/ACT inhaler Inhale 2 puffs into the lungs every 6 (six) hours as needed for wheezing or shortness of breath. 10/29/16  Yes Timmothy Euler, MD  alendronate (FOSAMAX) 70 MG tablet Take 1 tablet (70 mg total) by mouth every 7 (seven) days. Take with a full glass of water on an empty stomach. 09/15/15  Yes Timmothy Euler, MD  amLODipine (NORVASC) 5 MG tablet Take 1 tablet (5 mg total) by mouth daily before breakfast.  10/01/16  Yes Timmothy Euler, MD  COMBIVENT RESPIMAT 20-100 MCG/ACT AERS respimat Inhale 2 puffs into the lungs every 6 (six) hours as needed. wheezing 10/01/16  Yes Timmothy Euler, MD  diclofenac sodium (VOLTAREN) 1 % GEL Apply 2 g topically 4 (four) times daily. Patient taking differently: Apply 2 g topically 4 (four) times daily as needed (FOR PAIN.).  08/16/16  Yes Timmothy Euler, MD  doxycycline (VIBRA-TABS) 100 MG tablet Take 1 tablet (100 mg total) by mouth 2 (two) times daily. 1 po bid 10/29/16  Yes Timmothy Euler, MD  DULERA 100-5 MCG/ACT AERO INHALE 2 PUFFS INTO THE LUNGS 2 TIMES A DAY 08/03/16  Yes Timmothy Euler, MD  DULoxetine (CYMBALTA) 60 MG capsule TAKE (1) CAPSULE DAILY Patient taking differently: TAKE (1) CAPSULE 60 MG DAILY 10/01/16  Yes Timmothy Euler, MD  ibuprofen (ADVIL,MOTRIN) 800 MG tablet Take 1 tablet (800 mg total) by mouth every 8 (eight) hours as needed. Patient taking differently: Take 800 mg by mouth every 8 (eight) hours as needed (for pain.).  08/16/16  Yes Timmothy Euler, MD  methocarbamol (ROBAXIN) 500 MG tablet Take 1-2 tablets (500-1,000 mg total) by mouth 2 (two) times daily. Patient taking differently: Take 500-1,000 mg by mouth daily.  05/28/14  Yes Lysbeth Penner, FNP  omeprazole (PRILOSEC) 20 MG capsule TAKE (1) CAPSULE TWICE DAILY (TAKE ON AN EMPTY STOMACH AT LEAST 30MIN- UTES BEFORE MEALS). 10/01/16  Yes Timmothy Euler, MD  predniSONE (DELTASONE) 10 MG tablet Take 4 pills a day for 3 days, then 3 pills a day for 3 days, then 2 pills a day for 3 days, then 1 pill a day for 3 days, then stop 10/29/16  Yes Timmothy Euler, MD  tiotropium (SPIRIVA HANDIHALER) 18 MCG inhalation capsule Place 1 capsule (18 mcg total) into inhaler and inhale daily before breakfast. 08/04/16  Yes Timmothy Euler, MD  traZODone (DESYREL) 100 MG tablet Take 1 tablet (100 mg total) by mouth at bedtime as needed for sleep. 06/04/16  Yes Timmothy Euler, MD    albuterol (PROVENTIL) (2.5 MG/3ML) 0.083% nebulizer solution NEBULIZE 1 VIAL EVERY 4 HOURS AS NEEDED 11/10/16   Timmothy Euler, MD  HYDROcodone-acetaminophen (NORCO) 7.5-325 MG tablet Take 1-2 tablets by mouth every 4 (four) hours as needed for moderate pain. Patient not taking: Reported on 11/05/2016 05/14/16   Grier Mitts, PA-C  varenicline (CHANTIX CONTINUING MONTH PAK) 1 MG tablet Take 1 tablet (1 mg total) by mouth 2 (two) times daily. Patient not taking: Reported on 11/05/2016 08/16/16   Timmothy Euler, MD  All other systems have been reviewed and were otherwise negative with the exception of those mentioned in the HPI and as above.  Physical Exam: There were no vitals filed for this visit.  General: Alert, no acute distress Cardiovascular: No pedal edema Respiratory: No cyanosis, no use of accessory musculature Skin: No lesions in the area of chief complaint Neurologic: Sensation intact distally Psychiatric: Patient is competent for consent with normal mood and affect Lymphatic: No axillary or cervical lymphadenopathy  MUSCULOSKELETAL: + hoffman's b/l  Assessment/Plan: Cervical Myelopathy Plan for Procedure(s): ANTERIOR CERVICAL DECOMPRESSION FUSION CERVICAL 3-4, CERVICAL 4-5, CERVICAL 5-6, CERVICAL 6-7 WITH INSTRUMENTATION AND ALLOGRAFT   Sinclair Ship, MD 11/17/2016 1:48 PM

## 2016-11-18 ENCOUNTER — Encounter (HOSPITAL_COMMUNITY): Payer: Self-pay | Admitting: Surgery

## 2016-11-18 ENCOUNTER — Observation Stay (HOSPITAL_COMMUNITY): Payer: Medicaid Other

## 2016-11-18 ENCOUNTER — Encounter (HOSPITAL_COMMUNITY)
Admission: RE | Disposition: A | Discharge status: Home / Self Care | Payer: Self-pay | Source: Ambulatory Visit | Attending: Orthopedic Surgery

## 2016-11-18 ENCOUNTER — Ambulatory Visit (HOSPITAL_COMMUNITY): Payer: Medicaid Other | Admitting: Anesthesiology

## 2016-11-18 ENCOUNTER — Observation Stay (HOSPITAL_COMMUNITY)
Admission: RE | Admit: 2016-11-18 | Discharge: 2016-11-19 | Disposition: A | Discharge status: Home / Self Care | Payer: Medicaid Other | Source: Ambulatory Visit | Attending: Orthopedic Surgery | Admitting: Orthopedic Surgery

## 2016-11-18 DIAGNOSIS — Z419 Encounter for procedure for purposes other than remedying health state, unspecified: Secondary | ICD-10-CM

## 2016-11-18 DIAGNOSIS — M5001 Cervical disc disorder with myelopathy,  high cervical region: Secondary | ICD-10-CM | POA: Diagnosis not present

## 2016-11-18 DIAGNOSIS — F329 Major depressive disorder, single episode, unspecified: Secondary | ICD-10-CM | POA: Insufficient documentation

## 2016-11-18 DIAGNOSIS — J449 Chronic obstructive pulmonary disease, unspecified: Secondary | ICD-10-CM | POA: Diagnosis not present

## 2016-11-18 DIAGNOSIS — M797 Fibromyalgia: Secondary | ICD-10-CM | POA: Insufficient documentation

## 2016-11-18 DIAGNOSIS — Z7951 Long term (current) use of inhaled steroids: Secondary | ICD-10-CM | POA: Insufficient documentation

## 2016-11-18 DIAGNOSIS — G9520 Unspecified cord compression: Secondary | ICD-10-CM | POA: Insufficient documentation

## 2016-11-18 DIAGNOSIS — I10 Essential (primary) hypertension: Secondary | ICD-10-CM | POA: Insufficient documentation

## 2016-11-18 DIAGNOSIS — F17210 Nicotine dependence, cigarettes, uncomplicated: Secondary | ICD-10-CM | POA: Diagnosis not present

## 2016-11-18 DIAGNOSIS — Z7983 Long term (current) use of bisphosphonates: Secondary | ICD-10-CM | POA: Diagnosis not present

## 2016-11-18 DIAGNOSIS — K219 Gastro-esophageal reflux disease without esophagitis: Secondary | ICD-10-CM | POA: Insufficient documentation

## 2016-11-18 DIAGNOSIS — Z79899 Other long term (current) drug therapy: Secondary | ICD-10-CM | POA: Diagnosis not present

## 2016-11-18 DIAGNOSIS — M4802 Spinal stenosis, cervical region: Principal | ICD-10-CM | POA: Insufficient documentation

## 2016-11-18 DIAGNOSIS — M8580 Other specified disorders of bone density and structure, unspecified site: Secondary | ICD-10-CM | POA: Diagnosis not present

## 2016-11-18 DIAGNOSIS — M5410 Radiculopathy, site unspecified: Secondary | ICD-10-CM | POA: Diagnosis present

## 2016-11-18 HISTORY — PX: ANTERIOR CERVICAL DECOMPRESSION/DISCECTOMY FUSION 4 LEVELS: SHX5556

## 2016-11-18 LAB — TYPE AND SCREEN
ABO/RH(D): B POS
Antibody Screen: NEGATIVE

## 2016-11-18 LAB — ABO/RH: ABO/RH(D): B POS

## 2016-11-18 SURGERY — ANTERIOR CERVICAL DECOMPRESSION/DISCECTOMY FUSION 4 LEVELS
Anesthesia: General | Site: Spine Cervical

## 2016-11-18 MED ORDER — ALBUMIN HUMAN 5 % IV SOLN
INTRAVENOUS | Status: DC | PRN
  Administered 2016-11-18 (×2): via INTRAVENOUS

## 2016-11-18 MED ORDER — LIDOCAINE HCL (CARDIAC) 20 MG/ML IV SOLN
INTRAVENOUS | Status: DC | PRN
  Administered 2016-11-18: 60 mg via INTRATRACHEAL

## 2016-11-18 MED ORDER — FENTANYL CITRATE (PF) 100 MCG/2ML IJ SOLN
INTRAMUSCULAR | Status: DC | PRN
  Administered 2016-11-18 (×2): 50 ug via INTRAVENOUS

## 2016-11-18 MED ORDER — MIDAZOLAM HCL 2 MG/2ML IJ SOLN
INTRAMUSCULAR | Status: DC | PRN
  Administered 2016-11-18: .5 mg via INTRAVENOUS

## 2016-11-18 MED ORDER — GLYCOPYRROLATE 0.2 MG/ML IJ SOLN
INTRAMUSCULAR | Status: DC | PRN
  Administered 2016-11-18: .1 mg via INTRAVENOUS

## 2016-11-18 MED ORDER — MIDAZOLAM HCL 2 MG/2ML IJ SOLN
INTRAMUSCULAR | Status: AC
  Filled 2016-11-18: qty 2

## 2016-11-18 MED ORDER — FENTANYL CITRATE (PF) 100 MCG/2ML IJ SOLN
INTRAMUSCULAR | Status: AC
  Filled 2016-11-18: qty 2

## 2016-11-18 MED ORDER — ROCURONIUM BROMIDE 100 MG/10ML IV SOLN
INTRAVENOUS | Status: DC | PRN
  Administered 2016-11-18: 30 mg via INTRAVENOUS
  Administered 2016-11-18: 50 mg via INTRAVENOUS
  Administered 2016-11-18: 10 mg via INTRAVENOUS

## 2016-11-18 MED ORDER — BUPIVACAINE HCL (PF) 0.25 % IJ SOLN
INTRAMUSCULAR | Status: AC
  Filled 2016-11-18: qty 30

## 2016-11-18 MED ORDER — METHOCARBAMOL 500 MG PO TABS
500.0000 mg | ORAL_TABLET | Freq: Every day | ORAL | Status: DC
  Administered 2016-11-19: 500 mg via ORAL
  Filled 2016-11-18: qty 1
  Filled 2016-11-18: qty 2

## 2016-11-18 MED ORDER — ARTIFICIAL TEARS OP OINT
TOPICAL_OINTMENT | OPHTHALMIC | Status: DC | PRN
  Administered 2016-11-18: 1 via OPHTHALMIC

## 2016-11-18 MED ORDER — CEFAZOLIN SODIUM-DEXTROSE 2-4 GM/100ML-% IV SOLN
2.0000 g | Freq: Three times a day (TID) | INTRAVENOUS | Status: AC
  Administered 2016-11-18 (×2): 2 g via INTRAVENOUS
  Filled 2016-11-18 (×2): qty 100

## 2016-11-18 MED ORDER — SODIUM CHLORIDE 0.9% FLUSH: 3.0000 mL | INTRAVENOUS | Status: DC | PRN

## 2016-11-18 MED ORDER — DULOXETINE HCL 30 MG PO CPEP
60.0000 mg | ORAL_CAPSULE | Freq: Every day | ORAL | Status: DC
  Administered 2016-11-19: 60 mg via ORAL
  Filled 2016-11-18: qty 2

## 2016-11-18 MED ORDER — DOCUSATE SODIUM 100 MG PO CAPS
100.0000 mg | ORAL_CAPSULE | Freq: Two times a day (BID) | ORAL | Status: DC
  Administered 2016-11-18 – 2016-11-19 (×2): 100 mg via ORAL
  Filled 2016-11-18 (×2): qty 1

## 2016-11-18 MED ORDER — ALBUTEROL SULFATE HFA 108 (90 BASE) MCG/ACT IN AERS
INHALATION_SPRAY | RESPIRATORY_TRACT | Status: DC | PRN
  Administered 2016-11-18: 2 via RESPIRATORY_TRACT

## 2016-11-18 MED ORDER — EPINEPHRINE PF 1 MG/ML IJ SOLN
INTRAMUSCULAR | Status: AC
  Filled 2016-11-18: qty 1

## 2016-11-18 MED ORDER — LACTATED RINGERS IV SOLN
INTRAVENOUS | Status: DC | PRN
  Administered 2016-11-18 (×3): via INTRAVENOUS

## 2016-11-18 MED ORDER — DOXYCYCLINE HYCLATE 100 MG PO TABS
100.0000 mg | ORAL_TABLET | Freq: Two times a day (BID) | ORAL | Status: DC
Start: 2016-11-18 — End: 2016-11-18

## 2016-11-18 MED ORDER — PHENYLEPHRINE HCL 10 MG/ML IJ SOLN
INTRAVENOUS | Status: DC | PRN
  Administered 2016-11-18: 25 ug/min via INTRAVENOUS

## 2016-11-18 MED ORDER — METOPROLOL TARTRATE 5 MG/5ML IV SOLN
INTRAVENOUS | Status: AC
  Filled 2016-11-18: qty 5

## 2016-11-18 MED ORDER — ALUM & MAG HYDROXIDE-SIMETH 200-200-20 MG/5ML PO SUSP: 30.0000 mL | Freq: Four times a day (QID) | ORAL | Status: DC | PRN

## 2016-11-18 MED ORDER — TIOTROPIUM BROMIDE MONOHYDRATE 18 MCG IN CAPS
18.0000 ug | ORAL_CAPSULE | Freq: Every day | RESPIRATORY_TRACT | Status: DC
  Filled 2016-11-18: qty 5

## 2016-11-18 MED ORDER — ALENDRONATE SODIUM 70 MG PO TABS
70.0000 mg | ORAL_TABLET | ORAL | Status: DC
Start: 2016-11-18 — End: 2016-11-18

## 2016-11-18 MED ORDER — MENTHOL 3 MG MT LOZG: 1.0000 | LOZENGE | OROMUCOSAL | Status: DC | PRN

## 2016-11-18 MED ORDER — METOPROLOL TARTARATE 1 MG/ML SYRINGE (5ML)
Status: DC | PRN
  Administered 2016-11-18 (×5): 1 mg via INTRAVENOUS

## 2016-11-18 MED ORDER — TRAZODONE HCL 100 MG PO TABS
100.0000 mg | ORAL_TABLET | Freq: Every evening | ORAL | Status: DC | PRN
  Filled 2016-11-18: qty 1

## 2016-11-18 MED ORDER — ACETAMINOPHEN 650 MG RE SUPP: 650.0000 mg | RECTAL | Status: DC | PRN

## 2016-11-18 MED ORDER — AMLODIPINE BESYLATE 5 MG PO TABS
5.0000 mg | ORAL_TABLET | Freq: Every day | ORAL | Status: DC
  Administered 2016-11-19: 5 mg via ORAL
  Filled 2016-11-18: qty 1

## 2016-11-18 MED ORDER — DIAZEPAM 5 MG PO TABS: 5.0000 mg | ORAL_TABLET | Freq: Four times a day (QID) | ORAL | Status: DC | PRN

## 2016-11-18 MED ORDER — ZOLPIDEM TARTRATE 5 MG PO TABS: 5.0000 mg | ORAL_TABLET | Freq: Every evening | ORAL | Status: DC | PRN

## 2016-11-18 MED ORDER — PROPOFOL 1000 MG/100ML IV EMUL
INTRAVENOUS | Status: AC
  Filled 2016-11-18: qty 100

## 2016-11-18 MED ORDER — PANTOPRAZOLE SODIUM 40 MG PO TBEC
40.0000 mg | DELAYED_RELEASE_TABLET | Freq: Every day | ORAL | Status: DC
  Administered 2016-11-19: 40 mg via ORAL
  Filled 2016-11-18: qty 1

## 2016-11-18 MED ORDER — FLEET ENEMA 7-19 GM/118ML RE ENEM: 1.0000 | ENEMA | Freq: Once | RECTAL | Status: DC | PRN

## 2016-11-18 MED ORDER — IPRATROPIUM-ALBUTEROL 0.5-2.5 (3) MG/3ML IN SOLN
3.0000 mL | Freq: Four times a day (QID) | RESPIRATORY_TRACT | Status: DC | PRN
  Administered 2016-11-19: 3 mL via RESPIRATORY_TRACT
  Filled 2016-11-18: qty 3

## 2016-11-18 MED ORDER — OXYCODONE-ACETAMINOPHEN 5-325 MG PO TABS: 1.0000 | ORAL_TABLET | ORAL | Status: DC | PRN

## 2016-11-18 MED ORDER — PROPOFOL 10 MG/ML IV BOLUS
INTRAVENOUS | Status: AC
  Filled 2016-11-18: qty 20

## 2016-11-18 MED ORDER — KETAMINE HCL-SODIUM CHLORIDE 100-0.9 MG/10ML-% IV SOSY
PREFILLED_SYRINGE | INTRAVENOUS | Status: AC
  Filled 2016-11-18: qty 10

## 2016-11-18 MED ORDER — BUPIVACAINE-EPINEPHRINE 0.25% -1:200000 IJ SOLN
INTRAMUSCULAR | Status: DC | PRN
  Administered 2016-11-18: 3 mL

## 2016-11-18 MED ORDER — THROMBIN 20000 UNITS EX KIT
PACK | CUTANEOUS | Status: DC | PRN
  Administered 2016-11-18: 20000 [IU] via TOPICAL

## 2016-11-18 MED ORDER — ALBUTEROL SULFATE HFA 108 (90 BASE) MCG/ACT IN AERS: 2.0000 | INHALATION_SPRAY | Freq: Four times a day (QID) | RESPIRATORY_TRACT | Status: DC | PRN

## 2016-11-18 MED ORDER — MOMETASONE FURO-FORMOTEROL FUM 100-5 MCG/ACT IN AERO
2.0000 | INHALATION_SPRAY | Freq: Two times a day (BID) | RESPIRATORY_TRACT | Status: DC
  Administered 2016-11-18 – 2016-11-19 (×2): 2 via RESPIRATORY_TRACT
  Filled 2016-11-18: qty 8.8

## 2016-11-18 MED ORDER — SENNOSIDES-DOCUSATE SODIUM 8.6-50 MG PO TABS: 1.0000 | ORAL_TABLET | Freq: Every evening | ORAL | Status: DC | PRN

## 2016-11-18 MED ORDER — ROCURONIUM BROMIDE 50 MG/5ML IV SOSY
PREFILLED_SYRINGE | INTRAVENOUS | Status: AC
  Filled 2016-11-18: qty 5

## 2016-11-18 MED ORDER — HYDROMORPHONE HCL 1 MG/ML IJ SOLN: 0.2500 mg | INTRAMUSCULAR | Status: DC | PRN

## 2016-11-18 MED ORDER — ACETAMINOPHEN 325 MG PO TABS: 650.0000 mg | ORAL_TABLET | ORAL | Status: DC | PRN

## 2016-11-18 MED ORDER — ONDANSETRON HCL 4 MG/2ML IJ SOLN
INTRAMUSCULAR | Status: DC | PRN
  Administered 2016-11-18: 4 mg via INTRAVENOUS

## 2016-11-18 MED ORDER — ONDANSETRON HCL 4 MG/2ML IJ SOLN: 4.0000 mg | INTRAMUSCULAR | Status: DC | PRN

## 2016-11-18 MED ORDER — 0.9 % SODIUM CHLORIDE (POUR BTL) OPTIME
TOPICAL | Status: DC | PRN
  Administered 2016-11-18: 1000 mL

## 2016-11-18 MED ORDER — KETAMINE HCL 10 MG/ML IJ SOLN
INTRAMUSCULAR | Status: DC | PRN
  Administered 2016-11-18: 30 mg via INTRAVENOUS

## 2016-11-18 MED ORDER — LIDOCAINE 2% (20 MG/ML) 5 ML SYRINGE
INTRAMUSCULAR | Status: AC
  Filled 2016-11-18: qty 5

## 2016-11-18 MED ORDER — CEFAZOLIN SODIUM-DEXTROSE 2-4 GM/100ML-% IV SOLN
2.0000 g | INTRAVENOUS | Status: AC
  Administered 2016-11-18: 2 g via INTRAVENOUS
  Filled 2016-11-18: qty 100

## 2016-11-18 MED ORDER — PROPOFOL 10 MG/ML IV BOLUS
INTRAVENOUS | Status: DC | PRN
  Administered 2016-11-18: 70 mg via INTRAVENOUS

## 2016-11-18 MED ORDER — BISACODYL 5 MG PO TBEC: 5.0000 mg | DELAYED_RELEASE_TABLET | Freq: Every day | ORAL | Status: DC | PRN

## 2016-11-18 MED ORDER — HYDROCODONE-ACETAMINOPHEN 5-325 MG PO TABS
1.0000 | ORAL_TABLET | ORAL | Status: DC | PRN
  Administered 2016-11-18 – 2016-11-19 (×3): 2 via ORAL
  Filled 2016-11-18 (×3): qty 2

## 2016-11-18 MED ORDER — SUGAMMADEX SODIUM 200 MG/2ML IV SOLN
INTRAVENOUS | Status: DC | PRN
  Administered 2016-11-18: 200 mg via INTRAVENOUS

## 2016-11-18 MED ORDER — PROPOFOL 500 MG/50ML IV EMUL
INTRAVENOUS | Status: DC | PRN
  Administered 2016-11-18: 25 ug/kg/min via INTRAVENOUS

## 2016-11-18 MED ORDER — POVIDONE-IODINE 7.5 % EX SOLN
Freq: Once | CUTANEOUS | Status: DC
  Filled 2016-11-18: qty 118

## 2016-11-18 MED ORDER — VARENICLINE TARTRATE 1 MG PO TABS: 1.0000 mg | ORAL_TABLET | Freq: Two times a day (BID) | ORAL | Status: DC

## 2016-11-18 MED ORDER — ARTIFICIAL TEARS OP OINT
TOPICAL_OINTMENT | OPHTHALMIC | Status: AC
  Filled 2016-11-18: qty 3.5

## 2016-11-18 MED ORDER — ALBUTEROL SULFATE (2.5 MG/3ML) 0.083% IN NEBU: 2.5000 mg | INHALATION_SOLUTION | RESPIRATORY_TRACT | Status: DC | PRN

## 2016-11-18 MED ORDER — PANTOPRAZOLE SODIUM 40 MG IV SOLR: 40.0000 mg | Freq: Every day | INTRAVENOUS | Status: DC

## 2016-11-18 MED ORDER — PHENOL 1.4 % MT LIQD
1.0000 | OROMUCOSAL | Status: DC | PRN
  Filled 2016-11-18: qty 177

## 2016-11-18 MED ORDER — THROMBIN 20000 UNITS EX SOLR
CUTANEOUS | Status: AC
  Filled 2016-11-18: qty 20000

## 2016-11-18 MED ORDER — ONDANSETRON HCL 4 MG/2ML IJ SOLN
INTRAMUSCULAR | Status: AC
  Filled 2016-11-18: qty 2

## 2016-11-18 MED ORDER — SODIUM CHLORIDE 0.9% FLUSH
3.0000 mL | Freq: Two times a day (BID) | INTRAVENOUS | Status: DC
  Administered 2016-11-18: 3 mL via INTRAVENOUS

## 2016-11-18 MED ORDER — MORPHINE SULFATE (PF) 4 MG/ML IV SOLN: 1.0000 mg | INTRAVENOUS | Status: DC | PRN

## 2016-11-18 SURGICAL SUPPLY — 77 items
BENZOIN TINCTURE PRP APPL 2/3 (GAUZE/BANDAGES/DRESSINGS) ×3 IMPLANT
BIT DRILL NEURO 2X3.1 SFT TUCH (MISCELLANEOUS) ×1 IMPLANT
BIT DRILL SRG 14X2.2XFLT CHK (BIT) ×1 IMPLANT
BIT DRL SRG 14X2.2XFLT CHK (BIT) ×1
BLADE SURG 15 STRL LF DISP TIS (BLADE) ×1 IMPLANT
BLADE SURG 15 STRL SS (BLADE) ×2
BLADE SURG ROTATE 9660 (MISCELLANEOUS) ×3 IMPLANT
BONE VIVIGEN FORMABLE 5.4CC (Bone Implant) ×3 IMPLANT
BUR MATCHSTICK NEURO 3.0 LAGG (BURR) IMPLANT
CARTRIDGE OIL MAESTRO DRILL (MISCELLANEOUS) ×1 IMPLANT
CLOSURE STERI-STRIP 1/2X4 (GAUZE/BANDAGES/DRESSINGS) ×1
CLOSURE WOUND 1/2 X4 (GAUZE/BANDAGES/DRESSINGS) ×1
CLSR STERI-STRIP ANTIMIC 1/2X4 (GAUZE/BANDAGES/DRESSINGS) ×2 IMPLANT
COVER SURGICAL LIGHT HANDLE (MISCELLANEOUS) ×3 IMPLANT
CRADLE DONUT ADULT HEAD (MISCELLANEOUS) ×3 IMPLANT
DECANTER SPIKE VIAL GLASS SM (MISCELLANEOUS) ×3 IMPLANT
DIFFUSER DRILL AIR PNEUMATIC (MISCELLANEOUS) ×3 IMPLANT
DRAIN JACKSON RD 7FR 3/32 (WOUND CARE) IMPLANT
DRAPE C-ARM 42X72 X-RAY (DRAPES) ×3 IMPLANT
DRAPE POUCH INSTRU U-SHP 10X18 (DRAPES) ×3 IMPLANT
DRAPE SURG 17X23 STRL (DRAPES) ×9 IMPLANT
DRILL BIT SKYLINE 14MM (BIT) ×2
DRILL NEURO 2X3.1 SOFT TOUCH (MISCELLANEOUS) ×3
DURAPREP 26ML APPLICATOR (WOUND CARE) ×3 IMPLANT
ELECT COATED BLADE 2.86 ST (ELECTRODE) ×3 IMPLANT
ELECT REM PT RETURN 9FT ADLT (ELECTROSURGICAL) ×3
ELECTRODE REM PT RTRN 9FT ADLT (ELECTROSURGICAL) ×1 IMPLANT
EVACUATOR SILICONE 100CC (DRAIN) IMPLANT
GAUZE SPONGE 4X4 12PLY STRL (GAUZE/BANDAGES/DRESSINGS) ×3 IMPLANT
GAUZE SPONGE 4X4 16PLY XRAY LF (GAUZE/BANDAGES/DRESSINGS) ×3 IMPLANT
GLOVE BIO SURGEON STRL SZ7 (GLOVE) ×3 IMPLANT
GLOVE BIO SURGEON STRL SZ8 (GLOVE) ×3 IMPLANT
GLOVE BIOGEL PI IND STRL 7.0 (GLOVE) ×2 IMPLANT
GLOVE BIOGEL PI IND STRL 8 (GLOVE) ×1 IMPLANT
GLOVE BIOGEL PI INDICATOR 7.0 (GLOVE) ×4
GLOVE BIOGEL PI INDICATOR 8 (GLOVE) ×2
GOWN STRL REUS W/ TWL LRG LVL3 (GOWN DISPOSABLE) ×1 IMPLANT
GOWN STRL REUS W/ TWL XL LVL3 (GOWN DISPOSABLE) ×1 IMPLANT
GOWN STRL REUS W/TWL LRG LVL3 (GOWN DISPOSABLE) ×2
GOWN STRL REUS W/TWL XL LVL3 (GOWN DISPOSABLE) ×2
GRAFT BNE MATRIX VG FRMBL MD 5 (Bone Implant) ×1 IMPLANT
INTERLOCK LRDTC CRVCL VBR 6MM (Bone Implant) ×1 IMPLANT
INTERLOCK LRDTC CRVCL VBR SM (Bone Implant) ×3 IMPLANT
IV CATH 14GX2 1/4 (CATHETERS) ×3 IMPLANT
KIT BASIN OR (CUSTOM PROCEDURE TRAY) ×3 IMPLANT
KIT ROOM TURNOVER OR (KITS) ×3 IMPLANT
LORDOTIC CERVICAL VBR 6MM SM (Bone Implant) ×3 IMPLANT
LORDOTIC CERVICAL VBR SM 5MM (Bone Implant) ×9 IMPLANT
MANIFOLD NEPTUNE II (INSTRUMENTS) ×3 IMPLANT
NEEDLE PRECISIONGLIDE 27X1.5 (NEEDLE) ×3 IMPLANT
NEEDLE SPNL 20GX3.5 QUINCKE YW (NEEDLE) ×3 IMPLANT
NS IRRIG 1000ML POUR BTL (IV SOLUTION) ×3 IMPLANT
OIL CARTRIDGE MAESTRO DRILL (MISCELLANEOUS) ×3
PACK ORTHO CERVICAL (CUSTOM PROCEDURE TRAY) ×3 IMPLANT
PAD ARMBOARD 7.5X6 YLW CONV (MISCELLANEOUS) ×6 IMPLANT
PATTIES SURGICAL .5 X.5 (GAUZE/BANDAGES/DRESSINGS) IMPLANT
PATTIES SURGICAL .5 X1 (DISPOSABLE) IMPLANT
PIN DISTRACTION 14 (PIN) ×6 IMPLANT
PLATE 56MM SKYLINE (Plate) ×3 IMPLANT
SCREW SKYLINE VAR OS 14MM (Screw) ×30 IMPLANT
SPONGE GAUZE 4X4 12PLY STER LF (GAUZE/BANDAGES/DRESSINGS) ×3 IMPLANT
SPONGE INTESTINAL PEANUT (DISPOSABLE) ×6 IMPLANT
SPONGE SURGIFOAM ABS GEL 100 (HEMOSTASIS) ×3 IMPLANT
STRIP CLOSURE SKIN 1/2X4 (GAUZE/BANDAGES/DRESSINGS) ×2 IMPLANT
SURGIFLO W/THROMBIN 8M KIT (HEMOSTASIS) IMPLANT
SUT MNCRL AB 4-0 PS2 18 (SUTURE) ×3 IMPLANT
SUT VIC AB 2-0 CT2 18 VCP726D (SUTURE) ×3 IMPLANT
SYR BULB IRRIGATION 50ML (SYRINGE) ×3 IMPLANT
SYR CONTROL 10ML LL (SYRINGE) ×6 IMPLANT
TAPE CLOTH 4X10 WHT NS (GAUZE/BANDAGES/DRESSINGS) ×3 IMPLANT
TAPE CLOTH SURG 4X10 WHT LF (GAUZE/BANDAGES/DRESSINGS) ×3 IMPLANT
TAPE UMBILICAL COTTON 1/8X30 (MISCELLANEOUS) ×3 IMPLANT
TOWEL OR 17X24 6PK STRL BLUE (TOWEL DISPOSABLE) ×3 IMPLANT
TOWEL OR 17X26 10 PK STRL BLUE (TOWEL DISPOSABLE) ×3 IMPLANT
TRAY FOLEY CATH 16FRSI W/METER (SET/KITS/TRAYS/PACK) ×3 IMPLANT
WATER STERILE IRR 1000ML POUR (IV SOLUTION) ×3 IMPLANT
YANKAUER SUCT BULB TIP NO VENT (SUCTIONS) ×3 IMPLANT

## 2016-11-18 NOTE — Op Note (Signed)
Date of Surgery: 11/18/2016  PREOPERATIVE DIAGNOSES: 1. Cervical myelopathy 2. 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 Severe DDD C3-C7  POSTOPERATIVE DIAGNOSES: 1. Cervical myelopathy 2. 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 Severe DDD C3-C7  PROCEDURE: 1. Anterior cervical decompression and fusion C3/4, C4/5, C5/6, C6/7 2. Placement of anterior instrumentation, C2-C7 3. Insertion of interbody device x 4 (Titan intervertebral spacers). 4. Use of morselized allograft. 5. Intraoperative use of fluoroscopy.  SURGEON: Phylliss Bob, MD.  ASSISTANTLonn Georgia McKenzie PA-C.  ANESTHESIA: General endotracheal anesthesia.  COMPLICATIONS: None.  DISPOSITION: Stable.  ESTIMATED BLOOD LOSS: Minimal.  INDICATIONS FOR SURGERY: Briefly, Brenda Duran is a pleasant 56 year old feamle, who did present to me with a history of ongoing deterioration in fine motor skills and bilateral hand numbness.Marland Kitchen An MRI did reveal the findings noted above. Given the  patient's ongoing pain and the findings on her MRI and lack of improvement with  Appropriate nonoperative measures, we did discuss proceeding with the procedure noted above. The patient was fully aware of the risks and limitations of the surgery, and did elect to proceed.  OPERATIVE DETAILS: On 11/18/2016, the patient was brought to surgery and general endotracheal anesthesia was administered. The patient was placed supine on the hospital bed. The patient's neck was gently extended. The patient's arms were secured to her sides. All bony prominences were padded. The neck was prepped and draped. A left-sided transverse incision was then made. The platysma was incised. A Alben Deeds approach was utilized. A self-retaining retractor was placed and the vertebral bodies to be fused were subperiosteally exposed. Caspar pins were then placed into the C6 and C7  vertebral bodies and distraction was applied. I then proceeded with a thorough and complete C6/7 intervertebral diskectomy. I was very pleased with the decompression  that I was able to accomplish. An appropriate decompression was confirmed  using a nerve hook. The endplates were then prepared and the appropriate sized interbody spacer was packed with Vivagen and tamped into position in the usual fashion. The lower Caspar pin was removed and bone wax was then placed in its place. A new Caspar pin was placed into the C5 vertebral body and distraction was applied across the C5/6 intervertebral space. Once again, a thorough diskectomy was performed. There was no stenosis noted at the completion of the diskectomy, this was confirmed using a nerve hook bilaterally. The endplates were then prepared and the appropriate sized interbody spacer was packed with Vivagen and tamped into position in the usual fashion. The lower Caspar pin was removed and bone wax was placed in its place. A new Caspar pin was placed into the C4 vertebral body and again, distraction was applied across the C4/5 intervertebral space. Once again, a thorough and complete C4/5 intervertebral diskectomy and decompression was performed. The endplates were then prepared. The appropriate sized interbody spacer was then packed with Vivagen and tamped into position in the usual fashion. The lower Caspar pin was removed and bone wax was placed in its place. A new Caspar pin was placed into the C3 vertebral body and again, distraction was applied across the C3/4 intervertebral space. Once again, a thorough and complete C3/4 intervertebral diskectomy and decompression was performed. The endplates were then prepared. The appropriate sized interbody spacer was then packed with Vivagen and tamped into position in the usual fashion. I was very pleased with the press-fit of the spacers. The Caspar pins were removed and  bone wax was placed in their  place. I then selected the appropriate sized anterior cervical plate, which was placed over the anterior spine. 14 mm variable angle screws were placed, 2 in each vertebral body from C3 to C7 for a total of 10 vertebral body screws. The screws were then locked to the plate using the Cam locking mechanism. I was very pleased with the purchase of each of the screws. I was very pleased with the final fluoroscopic images. The wound was then irrigated. All bleeding was controlled using bipolar electrocautery. The wound was then closed in layers. The platysma was closed using 2-0 Vicryl and the skin was closed using 4-0 Monocryl. Benzoin and Steri-Strips were then applied followed by sterile dressing. All instrument counts were correct at the termination of the procedure.  Of note, Pricilla Holm was my assistant throughout surgery, and did aid in retraction, suctioning, and closure from start to finish.     Phylliss Bob, MD

## 2016-11-18 NOTE — Anesthesia Procedure Notes (Signed)
Procedure Name: Intubation Date/Time: 11/18/2016 7:45 AM Performed by: Roderic Palau Pre-anesthesia Checklist: Patient identified, Emergency Drugs available, Suction available and Patient being monitored Patient Re-evaluated:Patient Re-evaluated prior to inductionOxygen Delivery Method: Circle system utilized Preoxygenation: Pre-oxygenation with 100% oxygen Intubation Type: IV induction Ventilation: Mask ventilation without difficulty Laryngoscope Size: Glidescope (T4) Grade View: Grade III Tube type: Oral Tube size: 7.0 mm Number of attempts: 1 Airway Equipment and Method: Stylet Placement Confirmation: ETT inserted through vocal cords under direct vision,  positive ETCO2 and breath sounds checked- equal and bilateral Secured at: 19 cm Tube secured with: Tape Dental Injury: Teeth and Oropharynx as per pre-operative assessment

## 2016-11-18 NOTE — Anesthesia Postprocedure Evaluation (Signed)
Anesthesia Post Note  Patient: Brenda Duran  Procedure(s) Performed: Procedure(s) (LRB): ANTERIOR CERVICAL DECOMPRESSION FUSION CERVICAL 3-4, CERVICAL 4-5, CERVICAL 5-6, CERVICAL 6-7 WITH INSTRUMENTATION AND ALLOGRAFT (N/A)  Patient location during evaluation: PACU Anesthesia Type: General Level of consciousness: awake and alert Pain management: pain level controlled Vital Signs Assessment: post-procedure vital signs reviewed and stable Respiratory status: spontaneous breathing, nonlabored ventilation, respiratory function stable and patient connected to nasal cannula oxygen Cardiovascular status: blood pressure returned to baseline and stable Postop Assessment: no signs of nausea or vomiting Anesthetic complications: no       Last Vitals:  Vitals:   11/18/16 1151 11/18/16 1206  BP: 115/88 128/88  Pulse: 100 97  Resp: (!) 22 (!) 22  Temp:      Last Pain:  Vitals:   11/18/16 1130  TempSrc:   PainSc: 0-No pain    LLE Motor Response: Responds to commands (11/18/16 1200) LLE Sensation: Full sensation (11/18/16 1200) RLE Motor Response: Responds to commands (11/18/16 1200) RLE Sensation: Full sensation (11/18/16 1200)      Mikyah Alamo,W. EDMOND

## 2016-11-18 NOTE — Progress Notes (Signed)
Orthopedic Tech Progress Note Patient Details:  Brenda Duran [birth year] [id number]  Ortho Devices Ortho Device/Splint Location: Philly collar Ortho Device/Splint Interventions: Application   [place] Pink 11/18/2016, 1:40 PM

## 2016-11-18 NOTE — Transfer of Care (Signed)
Immediate Anesthesia Transfer of Care Note  Patient: KITRA ARZT  Procedure(s) Performed: Procedure(s) with comments: ANTERIOR CERVICAL DECOMPRESSION FUSION CERVICAL 3-4, CERVICAL 4-5, CERVICAL 5-6, CERVICAL 6-7 WITH INSTRUMENTATION AND ALLOGRAFT (N/A) - ANTERIOR CERVICAL DECOMPRESSION FUSION CERVICAL 3-4, CERVICAL 4-5, CERVICAL 5-6, CERVICAL 6-7 WITH INSTRUMENTATION AND ALLOGRAFT  Patient Location: PACU  Anesthesia Type:General  Level of Consciousness: awake, alert  and patient cooperative  Airway & Oxygen Therapy: Patient Spontanous Breathing and Patient connected to face mask oxygen  Post-op Assessment: Report given to RN, Post -op Vital signs reviewed and stable, Patient moving all extremities X 4 and Patient able to stick tongue midline  Post vital signs: Reviewed and stable  Last Vitals:  Vitals:   11/18/16 0622  BP: 111/80  Pulse: 95  Resp: 18  Temp: 37.1 C    Last Pain:  Vitals:   11/18/16 0622  TempSrc: Oral      Patients Stated Pain Goal: 4 ([id number])  Complications: No apparent anesthesia complications

## 2016-11-18 NOTE — Progress Notes (Signed)
PHARMACIST - PHYSICIAN COMMUNICATION  CONCERNING: P&T Medication Policy Regarding Oral Bisphosphonates  RECOMMENDATION: Your order for alendronate (Fosamax) has been discontinued at this time.  If the patient's post-hospital medical condition warrants safe use of this class of drugs, please resume the pre-hospital regimen upon discharge.  DESCRIPTION:  Alendronate (Fosamax), ibandronate (Boniva), and risedronate (Actonel) can cause severe esophageal erosions in patients who are unable to remain upright at least 30 minutes after taking this medication.   Since brief interruptions in therapy are thought to have minimal impact on bone mineral density, the [facility] has established that bisphosphonate orders should be routinely discontinued during hospitalization.   To override this safety policy and permit administration of Boniva, Fosamax, or Actonel in the hospital, prescribers must write "DO NOT HOLD" in the comments section when placing the order for this class of medications.   Kelvin Cellar, RPh 11/18/2016 1:41 PM

## 2016-11-19 DIAGNOSIS — M4802 Spinal stenosis, cervical region: Secondary | ICD-10-CM | POA: Diagnosis not present

## 2016-11-19 MED ORDER — TIOTROPIUM BROMIDE MONOHYDRATE 18 MCG IN CAPS
18.0000 ug | ORAL_CAPSULE | Freq: Every day | RESPIRATORY_TRACT | Status: DC
  Administered 2016-11-19: 18 ug via RESPIRATORY_TRACT

## 2016-11-19 MED FILL — Thrombin For Soln 20000 Unit: CUTANEOUS | Qty: 1 | Status: AC

## 2016-11-19 NOTE — Progress Notes (Signed)
    Patient doing well Minimal neck pain Has been ambulating   Physical Exam: Vitals:   11/18/16 2311 11/19/16 0332  BP: 117/78 116/79  Pulse: 91 100  Resp: 16 18  Temp: 98.5 F (36.9 C) 98 F (36.7 C)   Neck soft/supple Dressing in place NVI  POD #1 s/p C3-7 ACDF, doing well  - encourage ambulation - Percocet for pain, Valium for muscle spasms - likely d/c home later today with f/u in 2 weeks

## 2016-11-19 NOTE — Progress Notes (Signed)
Patient alert and oriented, mae's well, voiding adequate amount of urine, swallowing without difficulty, no c/o pain. Patient discharged home with family. Script and discharged instructions given to patient. Patient and family stated understanding of d/c instructions given and has an appointment with  Dr. Dumonski  

## 2016-11-23 ENCOUNTER — Encounter (HOSPITAL_COMMUNITY): Payer: Self-pay | Admitting: Orthopedic Surgery

## 2016-11-25 NOTE — Discharge Summary (Signed)
Patient ID: Brenda Duran MRN: [id number] DOB/AGE: 1961/01/06 56 y.o.  Admit date: 11/18/2016 Discharge date: 11/19/2016  Admission Diagnoses:  Active Problems:   Radiculopathy   Discharge Diagnoses:  Same  Past Medical History:  Diagnosis Date  . Anxiety   . Arthritis    "head to toes"   . Back pain   . COPD (chronic obstructive pulmonary disease) ([place])   . Depression   . Fibromyalgia   . GERD (gastroesophageal reflux disease)   . Hypertension   . 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 Osteopenia   . Pneumonia    [place]- 01/2015  . Seizures ([place])    6 seizures since 1995, last one 2012, seen in [place], stopeed medicine 3-4 yrs. ago, no seizure since   . Shortness of breath dyspnea   . Sleep apnea 2013   unsure of results  . Wrist fracture, left 01/2016    Surgeries: Procedure(s): ANTERIOR CERVICAL DECOMPRESSION FUSION CERVICAL 3-4, CERVICAL 4-5, CERVICAL 5-6, CERVICAL 6-7 WITH INSTRUMENTATION AND ALLOGRAFT on 11/18/2016   Consultants: None  Discharged Condition: Improved  Hospital Course: Brenda Duran is an 56 y.o. female who was admitted 11/18/2016 for operative treatment of myeloradiculopathy. Patient has severe unremitting pain that affects sleep, daily activities, and work/hobbies. After pre-op clearance the patient was taken to the operating room on 11/18/2016 and underwent  Procedure(s): ANTERIOR CERVICAL DECOMPRESSION FUSION CERVICAL 3-4, CERVICAL 4-5, CERVICAL 5-6, CERVICAL 6-7 WITH INSTRUMENTATION AND ALLOGRAFT.    Patient was given perioperative antibiotics:  Anti-infectives    Start     Dose/Rate Route Frequency Ordered Stop   11/18/16 1600  ceFAZolin (ANCEF) IVPB 2g/100 mL premix     2 g 200 mL/hr over 30 Minutes Intravenous Every 8 hours 11/18/16 1314 11/18/16 2334   11/18/16 1315  doxycycline (VIBRA-TABS) tablet 100 mg  Status:  Discontinued     100 mg Oral 2 times daily 11/18/16 1314 11/18/16 1344   11/18/16 0524  ceFAZolin (ANCEF)  IVPB 2g/100 mL premix     2 g 200 mL/hr over 30 Minutes Intravenous On call to O.R. 11/18/16 [id number] 11/18/16 0802       Patient was given sequential compression devices, early ambulation to prevent DVT.  Patient benefited maximally from hospital stay and there were no complications.    Recent vital signs: BP 103/72   Pulse (!) 104   Temp 98.6 F (37 C)   Resp 16   Wt 59 kg (130 lb)   SpO2 90%   BMI 26.26 kg/m    Discharge Medications:   Allergies as of 11/19/2016      Reactions   No Known Allergies       Medication List    TAKE these medications   albuterol 108 (90 Base) MCG/ACT inhaler Commonly known as:  PROVENTIL HFA;VENTOLIN HFA Inhale 2 puffs into the lungs every 6 (six) hours as needed for wheezing or shortness of breath.   albuterol (2.5 MG/3ML) 0.083% nebulizer solution Commonly known as:  PROVENTIL NEBULIZE 1 VIAL EVERY 4 HOURS AS NEEDED   alendronate 70 MG tablet Commonly known as:  FOSAMAX Take 1 tablet (70 mg total) by mouth every 7 (seven) days. Take with a full glass of water on an empty stomach.   amLODipine 5 MG tablet Commonly known as:  NORVASC Take 1 tablet (5 mg total) by mouth daily before breakfast.   COMBIVENT RESPIMAT 20-100 MCG/ACT Aers respimat Generic drug:  Ipratropium-Albuterol Inhale 2 puffs into  the lungs every 6 (six) hours as needed. wheezing   diclofenac sodium 1 % Gel Commonly known as:  VOLTAREN Apply 2 g topically 4 (four) times daily. What changed:  when to take this  reasons to take this   doxycycline 100 MG tablet Commonly known as:  VIBRA-TABS Take 1 tablet (100 mg total) by mouth 2 (two) times daily. 1 po bid   DULERA 100-5 MCG/ACT Aero Generic drug:  mometasone-formoterol INHALE 2 PUFFS INTO THE LUNGS 2 TIMES A DAY   DULoxetine 60 MG capsule Commonly known as:  CYMBALTA TAKE (1) CAPSULE DAILY What changed:  See the new instructions.   methocarbamol 500 MG tablet Commonly known as:  ROBAXIN Take 1-2 tablets  (500-1,000 mg total) by mouth 2 (two) times daily. What changed:  when to take this   omeprazole 20 MG capsule Commonly known as:  PRILOSEC TAKE (1) CAPSULE TWICE DAILY (TAKE ON AN EMPTY STOMACH AT LEAST 30MIN- [place]).   tiotropium 18 MCG inhalation capsule Commonly known as:  SPIRIVA HANDIHALER Place 1 capsule (18 mcg total) into inhaler and inhale daily before breakfast.   traZODone 100 MG tablet Commonly known as:  DESYREL Take 1 tablet (100 mg total) by mouth at bedtime as needed for sleep.   varenicline 1 MG tablet Commonly known as:  CHANTIX CONTINUING MONTH PAK Take 1 tablet (1 mg total) by mouth 2 (two) times daily.       Diagnostic Studies: Dg Chest 2 View  Result Date: 11/12/2016 CLINICAL DATA:  Preop for anterior cervical decompression and fusion. EXAM: CHEST  2 VIEW COMPARISON:  Radiographs of September 05, 2015. FINDINGS: The heart size and mediastinal contours are within normal limits. Both lungs are clear. No pneumothorax or pleural effusion is noted. Status post bilateral shoulder arthroplasties. IMPRESSION: No active cardiopulmonary disease. Electronically Signed   By: Marijo Conception, M.D.   On: 11/12/2016 11:54   Dg Cervical Spine 1 View  Result Date: 11/18/2016 CLINICAL DATA:  Intraoperative imaging for patient undergoing C3-7 ACDF. EXAM: DG C-ARM 61-120 MIN; DG CERVICAL SPINE - 1 VIEW COMPARISON:  MRI cervical spine 09/06/2016. FINDINGS: Single lateral intraoperative fluoroscopic spot view of the cervical spine in the lateral projection demonstrates anterior plate and screws and interbody spacers in place from C3-C7. No acute abnormality is identified. IMPRESSION: C3-7 ACDF in progress. Electronically Signed   By: Inge Rise M.D.   On: 11/18/2016 11:28   Dg C-arm 1-60 Min  Result Date: 11/18/2016 CLINICAL DATA:  Intraoperative imaging for patient undergoing C3-7 ACDF. EXAM: DG C-ARM 61-120 MIN; DG CERVICAL SPINE - 1 VIEW COMPARISON:  MRI cervical  spine 09/06/2016. FINDINGS: Single lateral intraoperative fluoroscopic spot view of the cervical spine in the lateral projection demonstrates anterior plate and screws and interbody spacers in place from C3-C7. No acute abnormality is identified. IMPRESSION: C3-7 ACDF in progress. Electronically Signed   By: Inge Rise M.D.   On: 11/18/2016 11:28    Disposition: 01-Home or Self Care   POD #1 s/p C3-7 ACDF, doing well  - encourage ambulation -Written scripts for pain signed and in chart -D/C instructions sheet printed and in chart -D/C today  -F/U in office 2 weeks   Signed: Justice Britain 11/25/2016, 12:34 PM

## 2016-12-16 DEATH — deceased

## 2017-01-18 ENCOUNTER — Telehealth: Payer: Self-pay | Admitting: *Deleted

## 2017-01-18 NOTE — Telephone Encounter (Signed)
I called the surgical center and canceled surgery scheduled for 2017-02-07.  Patient is deceased.

## 2017-05-06 IMAGING — CR DG CHEST 2V
2 series · 2 of 2 positions shown · non-contrast
Comparison: Radiographs September 05, 2015.

CLINICAL DATA: Preop for anterior cervical decompression and
fusion.

EXAM:
CHEST  2 VIEW

[w chest pa]
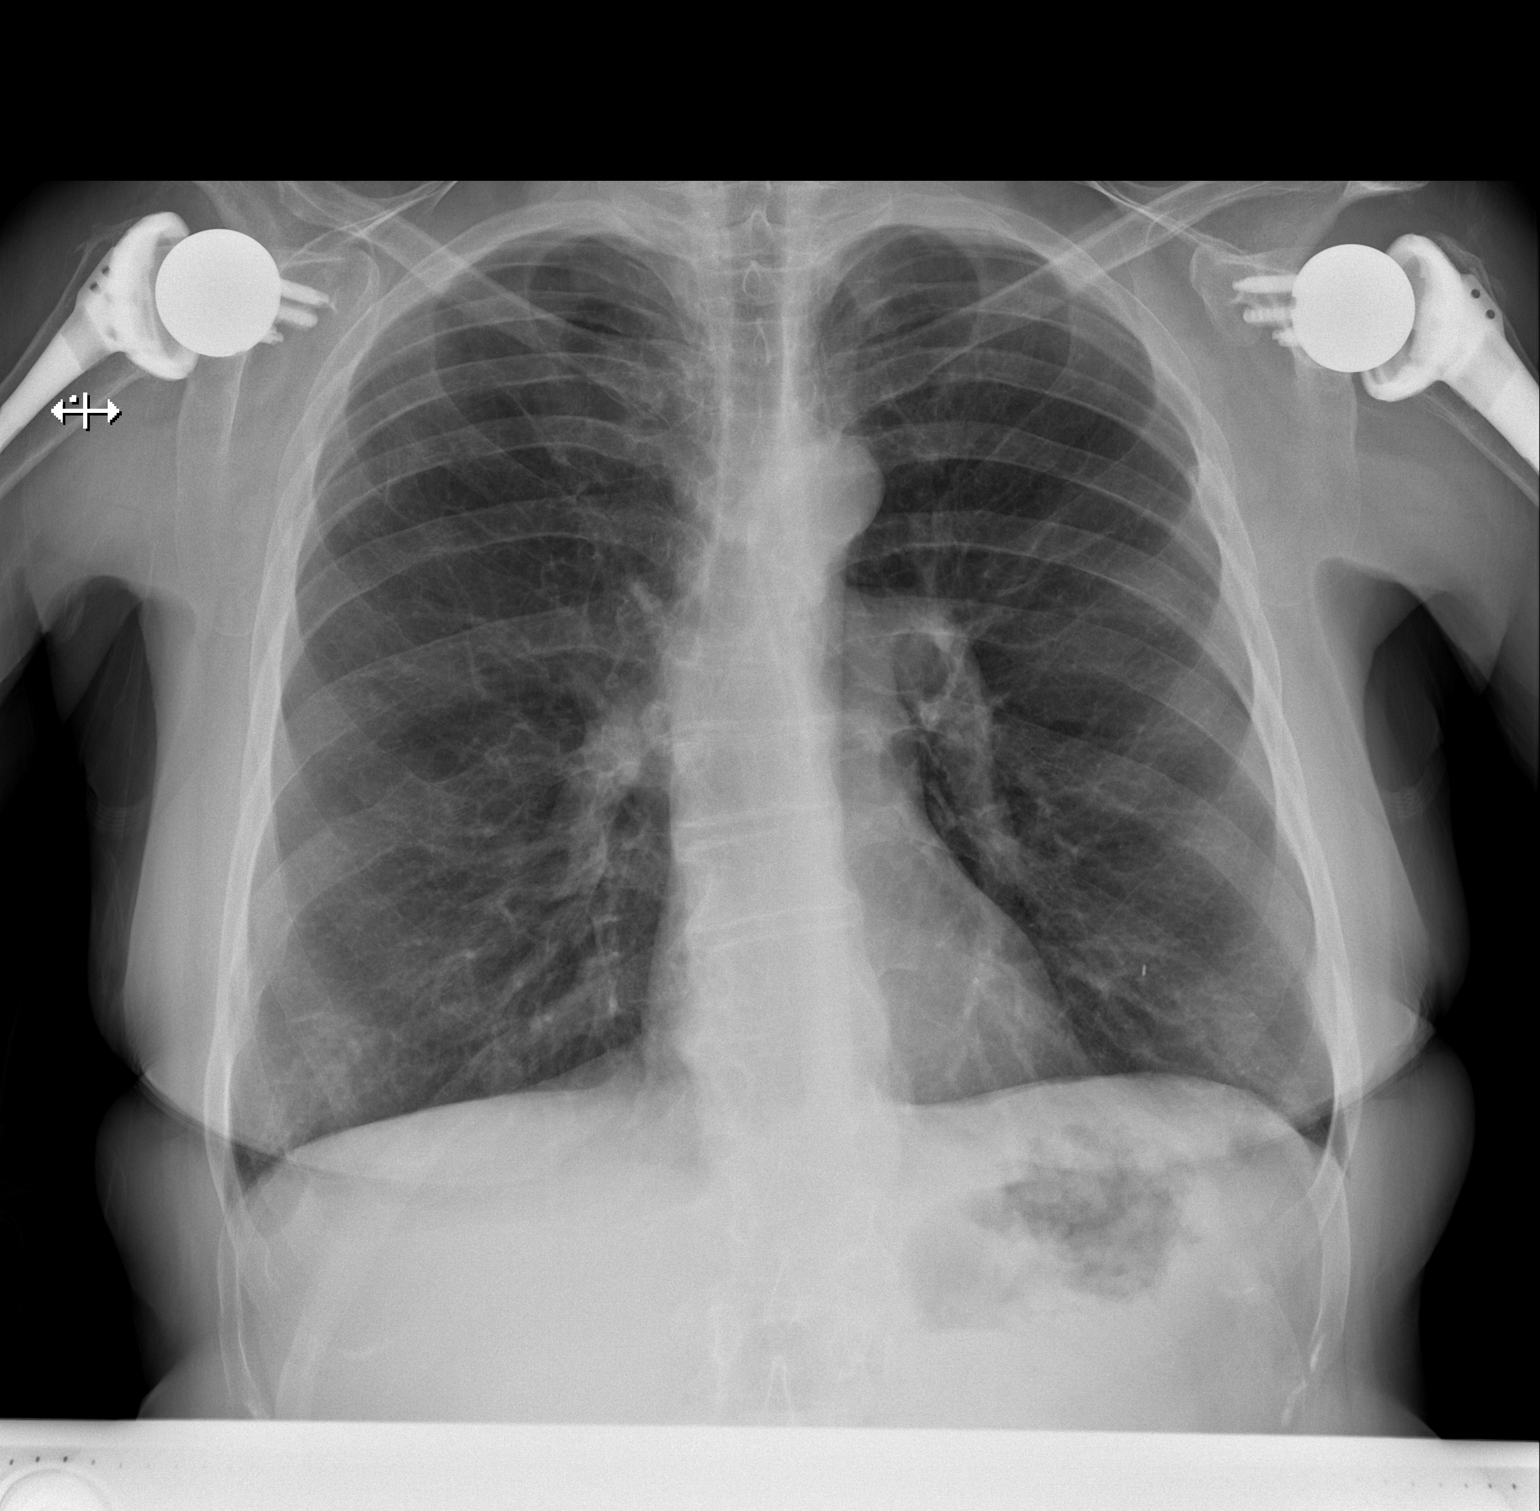

[w chest lat]
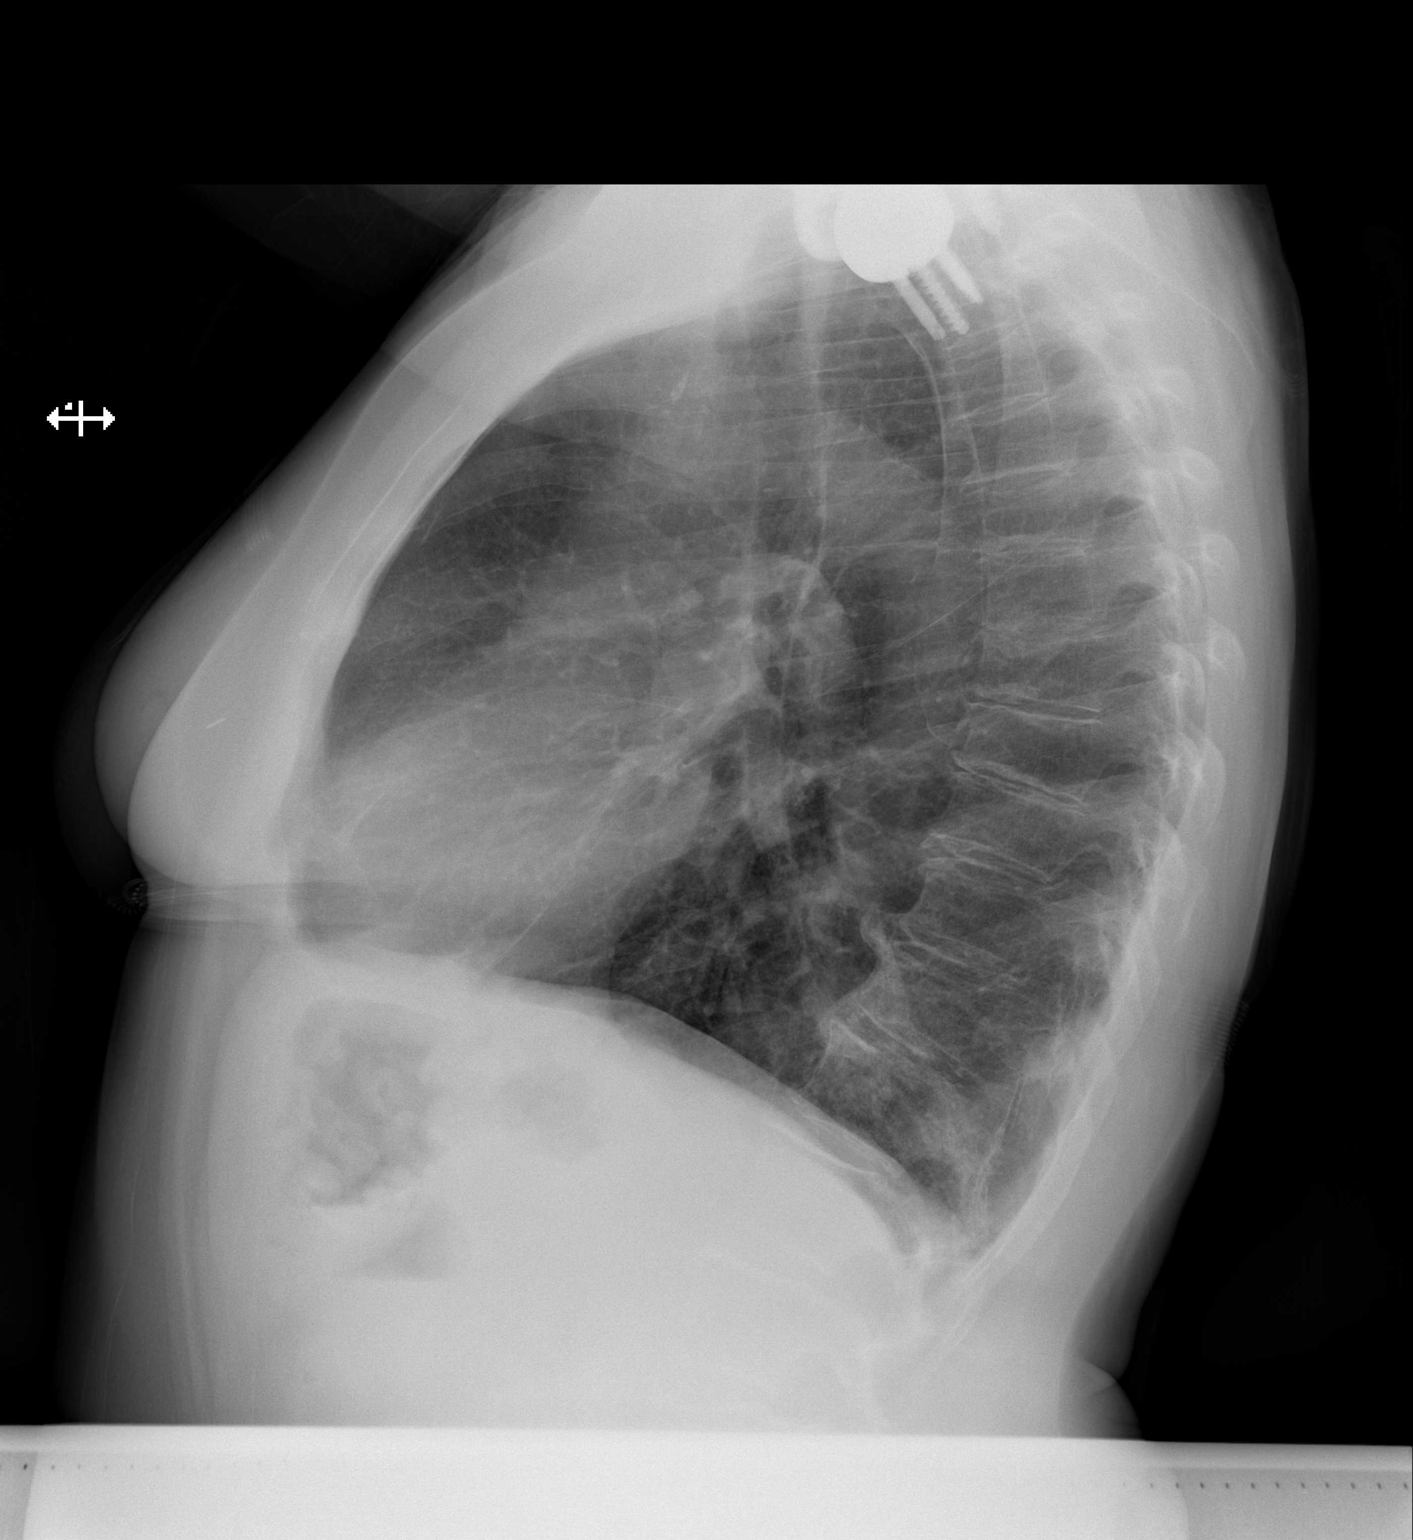

[2 of 2 positions shown; findings below may reference images not displayed]

FINDINGS: The heart size and mediastinal contours are within normal limits.
Both lungs are clear. No pneumothorax or pleural effusion is noted.
Status post bilateral shoulder arthroplasties.
IMPRESSION: No active cardiopulmonary disease.

## 2017-05-12 IMAGING — RF DG C-ARM 61-120 MIN
1 series · 1 of 1 positions shown · non-contrast
Comparison: MRI cervical spine 09/06/2016.

CLINICAL DATA: Intraoperative imaging for patient undergoing C3-7
ACDF.

EXAM:
DG C-ARM 61-120 MIN; DG CERVICAL SPINE - 1 VIEW

[Series 1: run · 1 of 1 slices shown]
[im 1/1]
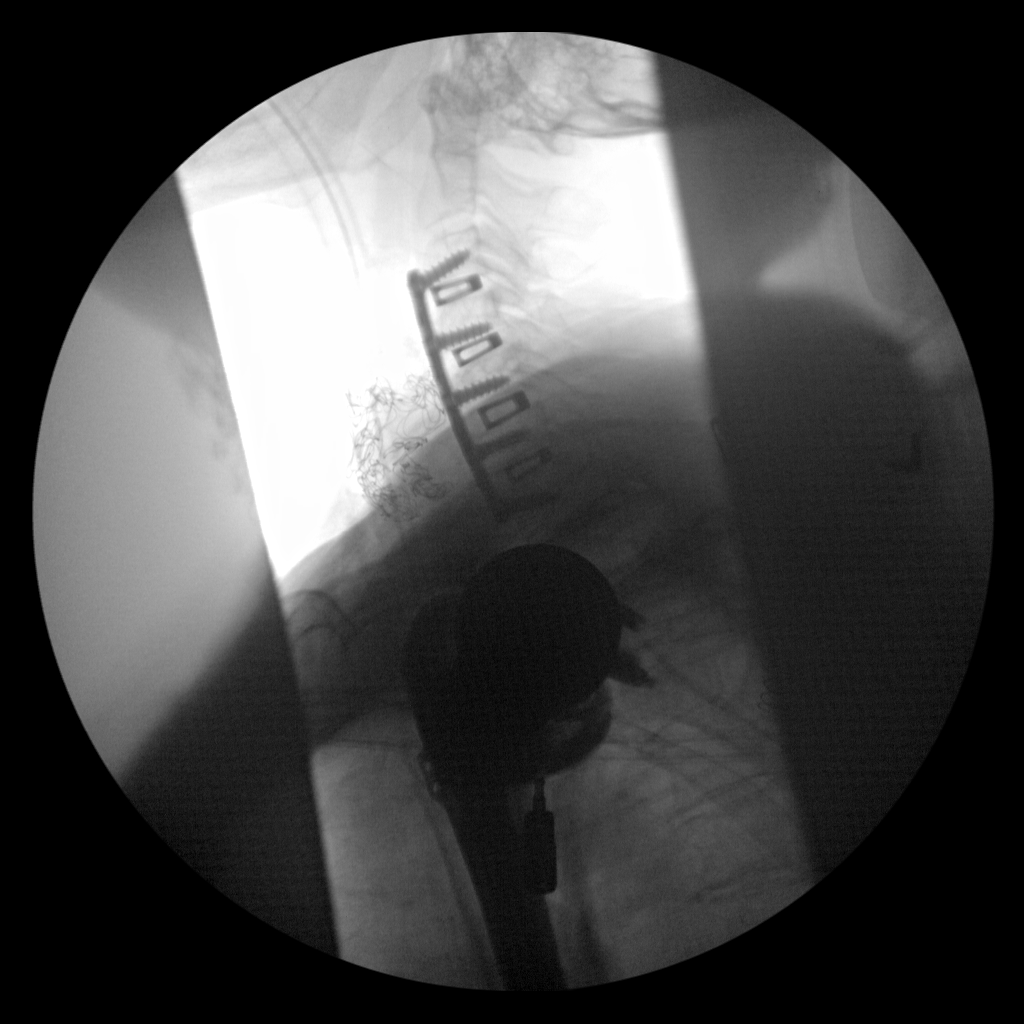

[1 of 1 positions shown; findings below may reference images not displayed]

FINDINGS: Single lateral intraoperative fluoroscopic spot view of the cervical
spine in the lateral projection demonstrates anterior plate and
screws and interbody spacers in place from C3-C7. No acute
abnormality is identified.
IMPRESSION: C3-7 ACDF in progress.
